# Patient Record
Sex: Male | Born: 1940
Health system: Southern US, Community
[De-identification: ages and names within clinical notes are randomized; demographics above are authoritative.]

## PROBLEM LIST (undated history)

## (undated) DIAGNOSIS — I2510 Atherosclerotic heart disease of native coronary artery without angina pectoris: Secondary | ICD-10-CM

## (undated) DIAGNOSIS — J45909 Unspecified asthma, uncomplicated: Secondary | ICD-10-CM

## (undated) DIAGNOSIS — H269 Unspecified cataract: Secondary | ICD-10-CM

## (undated) DIAGNOSIS — T7840XA Allergy, unspecified, initial encounter: Secondary | ICD-10-CM

## (undated) DIAGNOSIS — I10 Essential (primary) hypertension: Secondary | ICD-10-CM

## (undated) DIAGNOSIS — I6529 Occlusion and stenosis of unspecified carotid artery: Secondary | ICD-10-CM

## (undated) DIAGNOSIS — Z87442 Personal history of urinary calculi: Secondary | ICD-10-CM

## (undated) DIAGNOSIS — I4719 Other supraventricular tachycardia: Secondary | ICD-10-CM

## (undated) DIAGNOSIS — I471 Supraventricular tachycardia: Secondary | ICD-10-CM

## (undated) HISTORY — DX: Atherosclerotic heart disease of native coronary artery without angina pectoris: I25.10

## (undated) HISTORY — DX: Other supraventricular tachycardia: I47.19

## (undated) HISTORY — PX: EYE SURGERY: SHX253

## (undated) HISTORY — DX: Occlusion and stenosis of unspecified carotid artery: I65.29

## (undated) HISTORY — PX: CERVICAL DISC SURGERY: SHX588

## (undated) HISTORY — DX: Supraventricular tachycardia: I47.1

## (undated) HISTORY — DX: Essential (primary) hypertension: I10

## (undated) HISTORY — DX: Unspecified asthma, uncomplicated: J45.909

## (undated) HISTORY — PX: BACK SURGERY: SHX140

## (undated) HISTORY — DX: Unspecified cataract: H26.9

## (undated) HISTORY — DX: Allergy, unspecified, initial encounter: T78.40XA

---

## 2001-01-22 ENCOUNTER — Emergency Department (HOSPITAL_COMMUNITY): Admission: EM | Admit: 2001-01-22 | Discharge: 2001-01-22 | Payer: Self-pay | Admitting: Emergency Medicine

## 2001-01-22 ENCOUNTER — Encounter: Payer: Self-pay | Admitting: Emergency Medicine

## 2002-09-12 ENCOUNTER — Emergency Department (HOSPITAL_COMMUNITY): Admission: EM | Admit: 2002-09-12 | Discharge: 2002-09-12 | Payer: Self-pay | Admitting: Emergency Medicine

## 2002-09-12 ENCOUNTER — Encounter: Payer: Self-pay | Admitting: Emergency Medicine

## 2007-03-28 ENCOUNTER — Emergency Department (HOSPITAL_COMMUNITY): Admission: EM | Admit: 2007-03-28 | Discharge: 2007-03-28 | Payer: Self-pay | Admitting: Emergency Medicine

## 2007-03-29 ENCOUNTER — Emergency Department (HOSPITAL_COMMUNITY): Admission: EM | Admit: 2007-03-29 | Discharge: 2007-03-29 | Payer: Self-pay | Admitting: Emergency Medicine

## 2007-04-06 ENCOUNTER — Ambulatory Visit (HOSPITAL_COMMUNITY): Admission: RE | Admit: 2007-04-06 | Discharge: 2007-04-06 | Payer: Self-pay | Admitting: Urology

## 2007-09-12 ENCOUNTER — Emergency Department (HOSPITAL_COMMUNITY): Admission: EM | Admit: 2007-09-12 | Discharge: 2007-09-13 | Payer: Self-pay | Admitting: Emergency Medicine

## 2009-01-15 ENCOUNTER — Encounter: Payer: Self-pay | Admitting: Emergency Medicine

## 2009-01-15 ENCOUNTER — Inpatient Hospital Stay (HOSPITAL_COMMUNITY): Admission: AD | Admit: 2009-01-15 | Discharge: 2009-01-16 | Payer: Self-pay | Admitting: Cardiology

## 2009-01-15 ENCOUNTER — Ambulatory Visit: Payer: Self-pay | Admitting: Cardiology

## 2009-01-16 ENCOUNTER — Encounter: Payer: Self-pay | Admitting: Cardiovascular Disease

## 2009-01-16 ENCOUNTER — Encounter: Payer: Self-pay | Admitting: Cardiology

## 2009-02-08 DIAGNOSIS — J40 Bronchitis, not specified as acute or chronic: Secondary | ICD-10-CM | POA: Insufficient documentation

## 2009-02-08 DIAGNOSIS — I10 Essential (primary) hypertension: Secondary | ICD-10-CM | POA: Insufficient documentation

## 2009-02-08 DIAGNOSIS — R0602 Shortness of breath: Secondary | ICD-10-CM | POA: Insufficient documentation

## 2009-02-08 DIAGNOSIS — J45909 Unspecified asthma, uncomplicated: Secondary | ICD-10-CM | POA: Insufficient documentation

## 2009-02-08 DIAGNOSIS — I2510 Atherosclerotic heart disease of native coronary artery without angina pectoris: Secondary | ICD-10-CM | POA: Insufficient documentation

## 2009-02-09 ENCOUNTER — Encounter: Payer: Self-pay | Admitting: Cardiology

## 2009-02-09 ENCOUNTER — Ambulatory Visit: Payer: Self-pay | Admitting: Cardiology

## 2009-03-16 ENCOUNTER — Emergency Department (HOSPITAL_COMMUNITY): Admission: EM | Admit: 2009-03-16 | Discharge: 2009-03-16 | Payer: Self-pay | Admitting: Emergency Medicine

## 2009-03-24 ENCOUNTER — Inpatient Hospital Stay (HOSPITAL_COMMUNITY): Admission: EM | Admit: 2009-03-24 | Discharge: 2009-03-25 | Payer: Self-pay | Admitting: Emergency Medicine

## 2009-03-27 ENCOUNTER — Ambulatory Visit (HOSPITAL_COMMUNITY): Admission: RE | Admit: 2009-03-27 | Discharge: 2009-03-27 | Payer: Self-pay | Admitting: Urology

## 2010-12-17 ENCOUNTER — Inpatient Hospital Stay (HOSPITAL_COMMUNITY): Admission: EM | Admit: 2010-12-17 | Discharge: 2010-12-19 | Payer: Self-pay | Source: Home / Self Care

## 2010-12-20 LAB — URINALYSIS, ROUTINE W REFLEX MICROSCOPIC
Bilirubin Urine: NEGATIVE
Hgb urine dipstick: NEGATIVE
Nitrite: NEGATIVE
Protein, ur: NEGATIVE mg/dL
Specific Gravity, Urine: 1.02 (ref 1.005–1.030)
Urine Glucose, Fasting: NEGATIVE mg/dL
Urobilinogen, UA: 0.2 mg/dL (ref 0.0–1.0)
pH: 6 (ref 5.0–8.0)

## 2010-12-20 LAB — CARDIAC PANEL(CRET KIN+CKTOT+MB+TROPI)
CK, MB: 3.8 ng/mL (ref 0.3–4.0)
CK, MB: 4.2 ng/mL — ABNORMAL HIGH (ref 0.3–4.0)
CK, MB: 5 ng/mL — ABNORMAL HIGH (ref 0.3–4.0)
Relative Index: 2.9 — ABNORMAL HIGH (ref 0.0–2.5)
Relative Index: 1.8 (ref 0.0–2.5)
Relative Index: 1.5 (ref 0.0–2.5)
Total CK: 130 U/L (ref 7–232)
Total CK: 229 U/L (ref 7–232)
Total CK: 335 U/L — ABNORMAL HIGH (ref 7–232)
Troponin I: 0.02 ng/mL (ref 0.00–0.06)
Troponin I: 0.02 ng/mL (ref 0.00–0.06)
Troponin I: 0.02 ng/mL (ref 0.00–0.06)

## 2010-12-20 LAB — T4, FREE: Free T4: 1.17 ng/dL (ref 0.80–1.80)

## 2010-12-20 LAB — COMPREHENSIVE METABOLIC PANEL
ALT: 19 U/L (ref 0–53)
AST: 23 U/L (ref 0–37)
Albumin: 3.9 g/dL (ref 3.5–5.2)
Alkaline Phosphatase: 40 U/L (ref 39–117)
BUN: 12 mg/dL (ref 6–23)
CO2: 23 mEq/L (ref 19–32)
Calcium: 8.6 mg/dL (ref 8.4–10.5)
Chloride: 103 mEq/L (ref 96–112)
Creatinine, Ser: 0.86 mg/dL (ref 0.4–1.5)
GFR calc Af Amer: >60 mL/min (ref >60)
GFR calc non Af Amer: >60 mL/min (ref >60)
Glucose, Bld: 124 mg/dL — ABNORMAL HIGH (ref 70–99)
Potassium: 3.9 mEq/L (ref 3.5–5.1)
Sodium: 135 mEq/L (ref 135–145)
Total Bilirubin: 0.5 mg/dL (ref 0.3–1.2)
Total Protein: 6.9 g/dL (ref 6.0–8.3)

## 2010-12-20 LAB — CBC
HCT: 33.8 % — ABNORMAL LOW (ref 39.0–52.0)
HCT: 31 % — ABNORMAL LOW (ref 39.0–52.0)
Hemoglobin: 10.6 g/dL — ABNORMAL LOW (ref 13.0–17.0)
Hemoglobin: 9.9 g/dL — ABNORMAL LOW (ref 13.0–17.0)
MCH: 23.5 pg — ABNORMAL LOW (ref 26.0–34.0)
MCH: 23.9 pg — ABNORMAL LOW (ref 26.0–34.0)
MCHC: 31.4 g/dL (ref 30.0–36.0)
MCHC: 31.9 g/dL (ref 30.0–36.0)
MCV: 74.8 fL — ABNORMAL LOW (ref 78.0–100.0)
MCV: 74.7 fL — ABNORMAL LOW (ref 78.0–100.0)
Platelets: 291 10*3/uL (ref 150–400)
Platelets: 298 10*3/uL (ref 150–400)
RBC: 4.52 MIL/uL (ref 4.22–5.81)
RBC: 4.15 MIL/uL — ABNORMAL LOW (ref 4.22–5.81)
RDW: 15.7 % — ABNORMAL HIGH (ref 11.5–15.5)
RDW: 16 % — ABNORMAL HIGH (ref 11.5–15.5)
WBC: 10.4 10*3/uL (ref 4.0–10.5)
WBC: 7.7 10*3/uL (ref 4.0–10.5)

## 2010-12-20 LAB — URINE CULTURE
Colony Count: NO GROWTH
Culture  Setup Time: 201201142022
Culture: NO GROWTH
Special Requests: NEGATIVE

## 2010-12-20 LAB — LIPID PANEL
Cholesterol: 182 mg/dL (ref 0–200)
HDL: 41 mg/dL (ref >39)
LDL Cholesterol: 102 mg/dL — ABNORMAL HIGH (ref 0–99)
Total CHOL/HDL Ratio: 4.4 RATIO
Triglycerides: 194 mg/dL — ABNORMAL HIGH (ref <150)
VLDL: 39 mg/dL (ref 0–40)

## 2010-12-20 LAB — PHOSPHORUS: Phosphorus: 2.9 mg/dL (ref 2.3–4.6)

## 2010-12-20 LAB — BASIC METABOLIC PANEL
BUN: 10 mg/dL (ref 6–23)
CO2: 23 mEq/L (ref 19–32)
Calcium: 9.1 mg/dL (ref 8.4–10.5)
Chloride: 106 mEq/L (ref 96–112)
Creatinine, Ser: 0.81 mg/dL (ref 0.4–1.5)
GFR calc Af Amer: >60 mL/min (ref >60)
GFR calc non Af Amer: >60 mL/min (ref >60)
Glucose, Bld: 111 mg/dL — ABNORMAL HIGH (ref 70–99)
Potassium: 4 mEq/L (ref 3.5–5.1)
Sodium: 139 mEq/L (ref 135–145)

## 2010-12-20 LAB — POCT CARDIAC MARKERS
CKMB, poc: <1 ng/mL — ABNORMAL LOW (ref 1.0–8.0)
CKMB, poc: 1.4 ng/mL (ref 1.0–8.0)
Myoglobin, poc: 75.4 ng/mL (ref 12–200)
Myoglobin, poc: 120 ng/mL (ref 12–200)
Troponin i, poc: <0.05 ng/mL (ref 0.00–0.09)
Troponin i, poc: <0.05 ng/mL (ref 0.00–0.09)

## 2010-12-20 LAB — DIFFERENTIAL
Basophils Absolute: 0 10*3/uL (ref 0.0–0.1)
Basophils Relative: 1 % (ref 0–1)
Eosinophils Absolute: 0.1 10*3/uL (ref 0.0–0.7)
Eosinophils Relative: 1 % (ref 0–5)
Lymphocytes Relative: 29 % (ref 12–46)
Lymphs Abs: 2.2 10*3/uL (ref 0.7–4.0)
Monocytes Absolute: 0.7 10*3/uL (ref 0.1–1.0)
Monocytes Relative: 9 % (ref 3–12)
Neutro Abs: 4.7 10*3/uL (ref 1.7–7.7)
Neutrophils Relative %: 61 % (ref 43–77)

## 2010-12-20 LAB — IRON AND TIBC
Iron: 36 ug/dL — ABNORMAL LOW (ref 42–135)
Saturation Ratios: 8 % — ABNORMAL LOW (ref 20–55)
TIBC: 441 ug/dL — ABNORMAL HIGH (ref 215–435)
UIBC: 405 ug/dL

## 2010-12-20 LAB — VITAMIN B12: Vitamin B-12: 155 pg/mL — ABNORMAL LOW (ref 211–911)

## 2010-12-20 LAB — TSH
TSH: 0.872 u[IU]/mL (ref 0.350–4.500)
TSH: 1.319 u[IU]/mL (ref 0.350–4.500)

## 2010-12-20 LAB — FERRITIN: Ferritin: 7 ng/mL — ABNORMAL LOW (ref 22–322)

## 2010-12-20 LAB — LACTATE DEHYDROGENASE: LDH: 119 U/L (ref 94–250)

## 2010-12-20 LAB — D-DIMER, QUANTITATIVE: D-Dimer, Quant: 0.22 ug/mL-FEU (ref 0.00–0.48)

## 2010-12-20 LAB — MAGNESIUM: Magnesium: 2.2 mg/dL (ref 1.5–2.5)

## 2010-12-20 LAB — HEMOCCULT GUIAC POC 1CARD (OFFICE): Fecal Occult Bld: NEGATIVE

## 2010-12-20 LAB — FOLATE: Folate: 10.2 ng/mL

## 2010-12-22 LAB — ANTI-PARIETAL ANTIBODY: Parietal Cell Antibody-IgG: NEGATIVE

## 2010-12-22 LAB — OVA AND PARASITE EXAMINATION

## 2010-12-30 NOTE — H&P (Addendum)
Casey Klein, Casey Klein              ACCOUNT NO.:  [id number]  MEDICAL RECORD NO.:  [id number]          PATIENT TYPE:  EMS  LOCATION:  ED                            FACILITY:  [facility]  PHYSICIAN:  Rock Nephew, MD       DATE OF BIRTH:  28-Jun-1941  DATE OF ADMISSION:  12/17/2010 DATE OF DISCHARGE:  LH                             HISTORY & PHYSICAL   PRIMARY CARE PHYSICIAN:  Paulita Cradle, MD, at [facility]  CHIEF COMPLAINT:  Presyncope and 3 falls.  HISTORY OF PRESENT ILLNESS:  This is a 70 year old male with a past medical history of asthma, hypertension, history of bronchitis, history of nonobstructive coronary artery disease, history of hypercholesterolemia, history of kidney stone, history of cyst behind the left ear.  The patient reports that he was in his usual state of health until today.  The patient reports that he has had 3 falls today. The patient reports that he did not pass out, but he felt lightheaded. The patient described some nonspecific chest pain with no radiation.  He reported also some nausea and shortness of breath.  He denied having a pleuritic component of the chest pain.  He denied any radiation of the chest pain.  The patient denies having any seizure-like activity or any stroke-like activity.  The patient came into the ED.  The patient had essentially normal labs except for some mild microcytic anemia. Troponin-I point-of-care marker was negative.  The patient had no specific changes on the EKG.  We were asked to admit this patient.  PAST MEDICAL HISTORY: 1. History of asthma. 2. Hypertension. 3. Bronchitis. 4. History of nonobstructive coronary artery disease. 5. Mild history of hypercholesterolemia. 6. History of a kidney stone. 7. History of cyst behind the left ear.  PAST SURGICAL HISTORY:  Cervical disk surgery.  SOCIAL HISTORY:  The patient is nonsmoker, nondrinker, and no drug abuse.  The patient lives home alone.   The patient does not walk with a walker.  His daughter is present, who is also his medical decision maker if he cannot make his own medical decisions.  The patient's daughter's name is Mliss Sax, phone number [phone].  ALLERGIES:  PENICILLIN.  HOME MEDICATIONS:  He takes: 1. Amlodipine 5 mg p.o. daily. 2. Acetaminophen 500 mg p.r.n., unknown how often. 3. Aspirin 81 mg p.o. daily. 4. Albuterol p.r.n.  REVIEW OF SYSTEMS:  He denies any headache, blurry vision.  He reports some chronic back pain, he denies any currently.  He is not having any chest pain or any shortness of breath.  He currently is not nauseous, however, he was nauseous earlier.  He has no vomiting, he has no abdominal pain, he has no constipation, and no diarrhea.  He has no pain in his legs, but he does say he has a burning sensation in his legs.  PHYSICAL EXAMINATION:  VITAL SIGNS:  As follows:  Temperature 98.4, blood pressure 142/78, pulse rate 105 to 96, respiratory rate 20, 94% saturation 2 L nasal cannula. HEAD, EYES, EARS, NOSE, THROAT:  Normocephalic, atraumatic.  Pupils equally round and reactive to light. CARDIOVASCULAR:  The patient has S1 S2, regular rate, rhythm. LUNGS:  Clear to auscultation bilaterally, no wheezes, no rhonchi. ABDOMEN:  Soft, nontender, and nondistended, bowel sounds positive, no guarding, no rebound tenderness. EXTREMITIES:  No lower extremity edema is evident. NEUROLOGIC:  The patient is alert, awake, oriented x3.  His cranial nerves II-XII are grossly intact.  His cerebellar exam is intact and I do not appreciate any nystagmus.  IMPRESSION AND PLAN:  S 70 year old male admitted for presyncope and multiple falls: 1. Falls and presyncope.  Etiology is not clear.  Concern could be     cardiac-related presyncope.  The patient will be admitted to a     telemetry bed.  The patient will have cardiac enzymes cycled.  The     patient will also have a 2-D echocardiogram.   Unfortunately, it     probably will not be done until Monday.  We will also get a     computerized tomography of the head noncontrast to make sure there     is no acute abnormality. 2. History of hypertension.  For the hypertension, the patient will be     continued on amlodipine. 3. Nonspecific chest pain with a history of coronary artery disease.     The patient will be continued on aspirin.  We will cycle the     patient's cardiac enzymes x3.  We will also get a 2-D     echocardiogram to evaluate for any new wall motion abnormalities. 4. History of asthma.  The patient will be placed on albuterol p.r.n. 5. Hyperlipidemia.  We will check a full lipid profile.  The patient     does not appear to be taking an anticholesterol medication at home. 6. History of nephrolithiasis.  We will monitor.  We will check a     urinalysis. 7. Microcytic anemia.  We will check an anemia panel. 8. For deep venous thrombosis prophylaxis, the patient will be placed     on Lovenox. 9. The patient wishes to be a Do Not Resuscitate, which we will honor. 10.Also, the patient had evidence of some sinus tachycardia on the 12-     lead electrocardiogram.  The patient had     some nonspecific chest pain and shortness of breath.  Patient's     chest x-ray shows clear lungs so will get a STAT D-dimer.  If the     STAT D-dimer is positive, we will get a computed tomography     angiogram of the chest to rule out a pulmonary embolism.     Rock Nephew, MD     NH/MEDQ  D:  12/17/2010  T:  12/17/2010  Job:  [id number]  cc:   Paulita Cradle, M.D. [facility]  Electronically Signed by Rock Nephew MD on 12/30/2010 10:21:45 AM

## 2011-01-06 ENCOUNTER — Encounter: Payer: Self-pay | Admitting: Cardiology

## 2011-01-07 ENCOUNTER — Ambulatory Visit (INDEPENDENT_AMBULATORY_CARE_PROVIDER_SITE_OTHER): Payer: Medicare Other | Admitting: Cardiology

## 2011-01-07 ENCOUNTER — Ambulatory Visit: Admit: 2011-01-07 | Payer: Self-pay | Admitting: Cardiology

## 2011-01-07 ENCOUNTER — Encounter: Payer: Self-pay | Admitting: Cardiology

## 2011-01-07 DIAGNOSIS — R072 Precordial pain: Secondary | ICD-10-CM

## 2011-01-07 DIAGNOSIS — I6529 Occlusion and stenosis of unspecified carotid artery: Secondary | ICD-10-CM | POA: Insufficient documentation

## 2011-01-07 DIAGNOSIS — R42 Dizziness and giddiness: Secondary | ICD-10-CM

## 2011-01-11 ENCOUNTER — Ambulatory Visit (INDEPENDENT_AMBULATORY_CARE_PROVIDER_SITE_OTHER): Payer: Self-pay | Admitting: Internal Medicine

## 2011-01-12 NOTE — Assessment & Plan Note (Signed)
Summary: ROV PER PT CALL/LG/AMD   Vital Signs:  Patient profile:   70 year old male Height:      69 inches Weight:      194 pounds BMI:     28.75 Pulse rate:   80 / minute BP sitting:   164 / 78  (right arm) Cuff size:   regular  Vitals Entered By: Hardin Negus, RMA (January 07, 2011 2:59 PM)  Visit Type:  Follow-up Primary Provider:  Paulita Cradle NP  CC:  chest pain.  History of Present Illness: The patient presents after recent hospitalization. He was admitted with dizziness and presyncope. Carotid Dopplers demonstrated bilateral less than 50% stenosis. Head CT was negative. Telemetry demonstrated some atrial arrhythmias. There was no atrial fibrillation mentioned however in these arrhythmias were not correlated with symptoms. Since going home he has had no further presyncope. In the morning he gets a little dizzy but after he eats something this goes away. He denies any orthostatic symptoms. He denies any shortness of breath, PND or orthopnea. Interestingly he is describing decreased exercise tolerance. He used to be able to walk more for exercise but this has been more difficult. A shorter distance then previously getting short of breath. He developed some right-sided chest heaviness with this. This goes away but he stops what he is doing. He was actually told to stop exercising because of this. Is not noticing any palpitations. He has not had any neck or arm discomfort. He has had no PND or orthopnea.  Current Medications (verified): 1)  Metoprolol Tartrate 50 Mg Tabs (Metoprolol Tartrate) .... Take One Tablet By Mouth Twice A Day 2)  Ventolin Hfa 108 (90 Base) Mcg/act Aers (Albuterol Sulfate) .... As Directed 3)  Acetaminophen 500 Mg  Caps (Acetaminophen) .... As Needed 4)  Oxycodone-Acetaminophen 5-325 Mg Tabs (Oxycodone-Acetaminophen) .... At Bedtime 5)  Nu-Iron 150 Mg Caps (Polysaccharide Iron Complex) .... Two Times A Day 6)  Cyanocobalamin 1000 Mcg/ml Soln  (Cyanocobalamin) .... Once Daily 7)  Colace 100 Mg Caps (Docusate Sodium) .... 2 Once Daily  Allergies (verified): 1)  ! Pcn  Past History:  Past Medical History:  1. Kidney stone.   2. Asthma.   3. Hypertension.   4. Bronchitis.   5. Coronary artery disease (nonobstructive, mild).   6. Carotid stenosis (less than 50% bilateral 2012)  7. PAT  Past Surgical History: Reviewed history from 02/08/2009 and no changes required. Cervical disk surgery.   Review of Systems       As stated in the HPI and negative for all other systems.   Physical Exam  General:  Well developed, well nourished, in no acute distress. Head:  normocephalic and atraumatic Mouth:  edentulous, otherwise unremarkable. Chest Wall:  no deformities or breast masses noted Lungs:  Clear bilaterally to auscultation and percussion. Abdomen:  Bowel sounds positive; abdomen soft and non-tender without masses, organomegaly, or hernias noted. No hepatosplenomegaly. Msk:  Back normal, normal gait. Muscle strength and tone normal. Extremities:  No clubbing or cyanosis. Neurologic:  Alert and oriented x 3. Skin:  Intact without lesions or rashes. Cervical Nodes:  no significant adenopathy Inguinal Nodes:  no significant adenopathy Psych:  Normal affect.   Detailed Cardiovascular Exam  Neck    Carotids: Carotids full and equal bilaterally without bruits.      Neck Veins: Normal, no JVD.    Heart    Inspection: no deformities or lifts noted.      Palpation: normal PMI with no  thrills palpable.      Auscultation: regular rate and rhythm, S1, S2 without murmurs, rubs, gallops, or clicks.    Vascular    Abdominal Aorta: no palpable masses, pulsations, or audible bruits.      Femoral Pulses: normal femoral pulses bilaterally.      Pedal Pulses: normal pedal pulses bilaterally.      Radial Pulses: normal radial pulses bilaterally.      Peripheral Circulation: no clubbing, cyanosis, or edema noted with normal  capillary refill.     Impression & Recommendations:  Problem # 1:  DYSPNEA (ICD-786.05) The patient describes some dyspnea and chest discomfort. At this point I would like to screen him with an exercise treadmill test given this. Further cardiovascular testing will be based on the results. Orders: Treadmill (Treadmill)  Problem # 2:  CAROTID STENOSIS (ICD-433.10) He did have some carotid stenosis. I will schedule him for one year followup carotid Doppler. Orders: Carotid Duplex (Carotid Duplex)  Problem # 3:  ESSENTIAL HYPERTENSION, BENIGN (ICD-401.1) His blood pressure is slightly elevated. However, I am not sure that this is not an isolated finding and I will be able to judge better the need for further treatment at his upcoming treadmill.  Problem # 4:  DIZZINESS (ICD-780.4) He has had no further dizziness. He has had no palpitations. No further workup is planned at this point except as above.  Other Orders: EKG w/ Interpretation (93000)  Patient Instructions: 1)  Your physician recommends that you schedule a follow-up appointment in: 6 months with Dr Antoine Poche 2)  Your physician recommends that you continue on your current medications as directed. Please refer to the Current Medication list given to you today. 3)  Your physician has requested that you have a carotid duplex in one year (2/13). This test is an ultrasound of the carotid arteries in your neck. It looks at blood flow through these arteries that supply the brain with blood. Allow one hour for this exam. There are no restrictions or special instructions. 4)  Your physician has requested that you have an exercise tolerance test.  For further information please visit [email or web address]  Please also follow instruction sheet, as given.   Orders Added: 1)  EKG w/ Interpretation [93000] 2)  Treadmill [Treadmill] 3)  Carotid Duplex [Carotid Duplex]  Appended Document: ROV PER PT CALL/LG/AMD EKG sinus rhythm, rate 80, axis  within normal limits, intervals within normal limits, no acute ST-T wave changes

## 2011-01-12 NOTE — Miscellaneous (Signed)
  Clinical Lists Changes  Observations: Added new observation of US CAROTID:  Atherosclerotic plaque at both carotid bifurcations, right greater   than left.  Bilateral estimated ICA stenoses are less than 50%.  (12/18/2010 10:10)      Carotid Doppler  Procedure date:  12/18/2010  Findings:       Atherosclerotic plaque at both carotid bifurcations, right greater   than left.  Bilateral estimated ICA stenoses are less than 50%.

## 2011-01-19 ENCOUNTER — Emergency Department (HOSPITAL_COMMUNITY): Payer: Medicare Other

## 2011-01-19 ENCOUNTER — Emergency Department (HOSPITAL_COMMUNITY)
Admission: EM | Admit: 2011-01-19 | Discharge: 2011-01-19 | Disposition: A | Discharge status: Home / Self Care | Payer: Medicare Other | Attending: Emergency Medicine | Admitting: Emergency Medicine

## 2011-01-19 DIAGNOSIS — M549 Dorsalgia, unspecified: Secondary | ICD-10-CM | POA: Insufficient documentation

## 2011-01-19 DIAGNOSIS — J40 Bronchitis, not specified as acute or chronic: Secondary | ICD-10-CM | POA: Insufficient documentation

## 2011-01-19 DIAGNOSIS — I4891 Unspecified atrial fibrillation: Secondary | ICD-10-CM | POA: Insufficient documentation

## 2011-01-19 DIAGNOSIS — E7800 Pure hypercholesterolemia, unspecified: Secondary | ICD-10-CM | POA: Insufficient documentation

## 2011-01-19 DIAGNOSIS — I10 Essential (primary) hypertension: Secondary | ICD-10-CM | POA: Insufficient documentation

## 2011-01-19 DIAGNOSIS — IMO0002 Reserved for concepts with insufficient information to code with codable children: Secondary | ICD-10-CM | POA: Insufficient documentation

## 2011-01-19 DIAGNOSIS — Z79899 Other long term (current) drug therapy: Secondary | ICD-10-CM | POA: Insufficient documentation

## 2011-02-04 ENCOUNTER — Other Ambulatory Visit: Payer: Self-pay | Admitting: Family Medicine

## 2011-02-04 DIAGNOSIS — M545 Low back pain, unspecified: Secondary | ICD-10-CM

## 2011-02-05 ENCOUNTER — Ambulatory Visit
Admission: RE | Admit: 2011-02-05 | Discharge: 2011-02-05 | Disposition: A | Discharge status: Home / Self Care | Payer: Medicare Other | Source: Ambulatory Visit | Attending: Family Medicine | Admitting: Family Medicine

## 2011-02-05 DIAGNOSIS — M545 Low back pain, unspecified: Secondary | ICD-10-CM

## 2011-02-07 ENCOUNTER — Encounter: Payer: Medicare Other | Admitting: Physician Assistant

## 2011-03-16 LAB — URINALYSIS, ROUTINE W REFLEX MICROSCOPIC
Bilirubin Urine: NEGATIVE
Glucose, UA: NEGATIVE mg/dL
Ketones, ur: NEGATIVE mg/dL
Leukocytes, UA: NEGATIVE
Nitrite: NEGATIVE
Protein, ur: NEGATIVE mg/dL
Specific Gravity, Urine: 1.02 (ref 1.005–1.030)
Urobilinogen, UA: 0.2 mg/dL (ref 0.0–1.0)
pH: 7 (ref 5.0–8.0)

## 2011-03-16 LAB — BASIC METABOLIC PANEL
BUN: 10 mg/dL (ref 6–23)
CO2: 23 mEq/L (ref 19–32)
CO2: 22 mEq/L (ref 19–32)
Calcium: 8.7 mg/dL (ref 8.4–10.5)
Chloride: 104 mEq/L (ref 96–112)
Chloride: 105 mEq/L (ref 96–112)
Creatinine, Ser: 1.2 mg/dL (ref 0.4–1.5)
Creatinine, Ser: 1.13 mg/dL (ref 0.4–1.5)
GFR calc Af Amer: >60 mL/min (ref >60)
GFR calc Af Amer: >60 mL/min (ref >60)
GFR calc non Af Amer: 57 mL/min — ABNORMAL LOW (ref >60)
GFR calc non Af Amer: >60 mL/min (ref >60)
Glucose, Bld: 117 mg/dL — ABNORMAL HIGH (ref 70–99)
Potassium: 3.9 mEq/L (ref 3.5–5.1)
Potassium: 3.9 mEq/L (ref 3.5–5.1)
Potassium: 4 mEq/L (ref 3.5–5.1)
Sodium: 138 mEq/L (ref 135–145)
Sodium: 139 mEq/L (ref 135–145)

## 2011-03-16 LAB — DIFFERENTIAL
Basophils Relative: 1 % (ref 0–1)
Eosinophils Absolute: 0 10*3/uL (ref 0.0–0.7)
Eosinophils Relative: 0 % (ref 0–5)
Eosinophils Relative: 0 % (ref 0–5)
Lymphocytes Relative: 13 % (ref 12–46)
Lymphs Abs: 1.5 10*3/uL (ref 0.7–4.0)
Lymphs Abs: 1.5 10*3/uL (ref 0.7–4.0)
Monocytes Absolute: 0.9 10*3/uL (ref 0.1–1.0)
Monocytes Absolute: 0.2 10*3/uL (ref 0.1–1.0)
Monocytes Relative: 6 % (ref 3–12)
Monocytes Relative: 2 % — ABNORMAL LOW (ref 3–12)
Neutro Abs: 11.5 10*3/uL — ABNORMAL HIGH (ref 1.7–7.7)
Neutrophils Relative %: 83 % — ABNORMAL HIGH (ref 43–77)
Neutrophils Relative %: 88 % — ABNORMAL HIGH (ref 43–77)

## 2011-03-16 LAB — PROTIME-INR: INR: 1.1 (ref 0.00–1.49)

## 2011-03-16 LAB — CBC
HCT: 37.5 % — ABNORMAL LOW (ref 39.0–52.0)
HCT: 43.8 % (ref 39.0–52.0)
Hemoglobin: 13.1 g/dL (ref 13.0–17.0)
MCHC: 34.3 g/dL (ref 30.0–36.0)
MCV: 94.1 fL (ref 78.0–100.0)
MCV: 94.1 fL (ref 78.0–100.0)
Platelets: 203 10*3/uL (ref 150–400)
RBC: 4.65 MIL/uL (ref 4.22–5.81)
RBC: 4.65 MIL/uL (ref 4.22–5.81)
WBC: 12 10*3/uL — ABNORMAL HIGH (ref 4.0–10.5)
WBC: 13.9 10*3/uL — ABNORMAL HIGH (ref 4.0–10.5)

## 2011-03-16 LAB — URINE CULTURE
Colony Count: NO GROWTH
Culture: NO GROWTH

## 2011-03-16 LAB — URINE MICROSCOPIC-ADD ON

## 2011-03-16 LAB — APTT: aPTT: 27 seconds (ref 24–37)

## 2011-03-22 LAB — COMPREHENSIVE METABOLIC PANEL
Albumin: 3.7 g/dL (ref 3.5–5.2)
Alkaline Phosphatase: 50 U/L (ref 39–117)
BUN: 8 mg/dL (ref 6–23)
Chloride: 107 mEq/L (ref 96–112)
Creatinine, Ser: 0.98 mg/dL (ref 0.4–1.5)
Glucose, Bld: 125 mg/dL — ABNORMAL HIGH (ref 70–99)
Potassium: 4.3 mEq/L (ref 3.5–5.1)
Total Bilirubin: 0.9 mg/dL (ref 0.3–1.2)
Total Protein: 6.7 g/dL (ref 6.0–8.3)

## 2011-03-22 LAB — CARDIAC PANEL(CRET KIN+CKTOT+MB+TROPI)
CK, MB: 3.7 ng/mL (ref 0.3–4.0)
CK, MB: 4.4 ng/mL — ABNORMAL HIGH (ref 0.3–4.0)
Relative Index: 4 — ABNORMAL HIGH (ref 0.0–2.5)
Total CK: 110 U/L (ref 7–232)
Total CK: 65 U/L (ref 7–232)
Troponin I: 0.01 ng/mL (ref 0.00–0.06)

## 2011-03-22 LAB — LIPID PANEL
LDL Cholesterol: 124 mg/dL — ABNORMAL HIGH (ref 0–99)
Total CHOL/HDL Ratio: 5 RATIO
VLDL: 33 mg/dL (ref 0–40)

## 2011-03-22 LAB — URINALYSIS, ROUTINE W REFLEX MICROSCOPIC
Ketones, ur: NEGATIVE mg/dL
Nitrite: NEGATIVE
Protein, ur: NEGATIVE mg/dL
Urobilinogen, UA: 0.2 mg/dL (ref 0.0–1.0)

## 2011-03-22 LAB — POCT CARDIAC MARKERS
CKMB, poc: 1.6 ng/mL (ref 1.0–8.0)
Troponin i, poc: <0.05 ng/mL (ref 0.00–0.09)

## 2011-03-22 LAB — BASIC METABOLIC PANEL
BUN: 8 mg/dL (ref 6–23)
Calcium: 8.5 mg/dL (ref 8.4–10.5)
Chloride: 107 mEq/L (ref 96–112)
Creatinine, Ser: 0.82 mg/dL (ref 0.4–1.5)
GFR calc Af Amer: >60 mL/min (ref >60)
GFR calc non Af Amer: >60 mL/min (ref >60)

## 2011-03-22 LAB — CBC
MCHC: 34.7 g/dL (ref 30.0–36.0)
MCHC: 34.9 g/dL (ref 30.0–36.0)
MCV: 93.1 fL (ref 78.0–100.0)
Platelets: 226 10*3/uL (ref 150–400)
Platelets: 233 10*3/uL (ref 150–400)
RBC: 4.62 MIL/uL (ref 4.22–5.81)
RDW: 13.6 % (ref 11.5–15.5)
RDW: 13.3 % (ref 11.5–15.5)

## 2011-03-22 LAB — HEMOGLOBIN A1C
Hgb A1c MFr Bld: 5.9 % (ref 4.6–6.1)
Mean Plasma Glucose: 123 mg/dL

## 2011-03-22 LAB — HEPARIN LEVEL (UNFRACTIONATED)
Heparin Unfractionated: 0.29 IU/mL — ABNORMAL LOW (ref 0.30–0.70)
Heparin Unfractionated: <0.1 IU/mL — ABNORMAL LOW (ref 0.30–0.70)

## 2011-03-22 LAB — DIFFERENTIAL
Basophils Absolute: 0 10*3/uL (ref 0.0–0.1)
Basophils Relative: 0 % (ref 0–1)
Eosinophils Absolute: 0 10*3/uL (ref 0.0–0.7)
Neutro Abs: 8.7 10*3/uL — ABNORMAL HIGH (ref 1.7–7.7)
Neutrophils Relative %: 84 % — ABNORMAL HIGH (ref 43–77)

## 2011-03-22 LAB — PROTIME-INR
INR: 1.1 (ref 0.00–1.49)
Prothrombin Time: 14.5 seconds (ref 11.6–15.2)

## 2011-03-22 LAB — LIPASE, BLOOD: Lipase: 27 U/L (ref 11–59)

## 2011-03-24 ENCOUNTER — Telehealth: Payer: Self-pay | Admitting: Cardiology

## 2011-03-24 NOTE — Telephone Encounter (Signed)
Dr office wants to know if pt has sur clearance for his surgery. Dr office has sent over sur clearance form to be fill out.

## 2011-03-24 NOTE — Telephone Encounter (Signed)
Will ask Dr Antoine Poche if pt has been cleared for surgery as I do not see anything documented of the request

## 2011-03-28 ENCOUNTER — Encounter (HOSPITAL_COMMUNITY)
Admission: RE | Admit: 2011-03-28 | Discharge: 2011-03-28 | Disposition: A | Discharge status: Home / Self Care | Payer: Medicare Other | Source: Ambulatory Visit | Attending: Neurosurgery | Admitting: Neurosurgery

## 2011-03-28 ENCOUNTER — Other Ambulatory Visit (HOSPITAL_COMMUNITY): Payer: Self-pay | Admitting: Neurosurgery

## 2011-03-28 ENCOUNTER — Telehealth: Payer: Self-pay | Admitting: Cardiology

## 2011-03-28 DIAGNOSIS — R0602 Shortness of breath: Secondary | ICD-10-CM

## 2011-03-28 DIAGNOSIS — Z01811 Encounter for preprocedural respiratory examination: Secondary | ICD-10-CM

## 2011-03-28 DIAGNOSIS — R079 Chest pain, unspecified: Secondary | ICD-10-CM

## 2011-03-28 LAB — CBC
HCT: 42.8 % (ref 39.0–52.0)
Hemoglobin: 14 g/dL (ref 13.0–17.0)
MCHC: 32.7 g/dL (ref 30.0–36.0)
RBC: 5.3 MIL/uL (ref 4.22–5.81)
WBC: 12.2 10*3/uL — ABNORMAL HIGH (ref 4.0–10.5)

## 2011-03-28 LAB — BASIC METABOLIC PANEL
CO2: 25 mEq/L (ref 19–32)
Calcium: 8.9 mg/dL (ref 8.4–10.5)
Chloride: 110 mEq/L (ref 96–112)
GFR calc Af Amer: >60 mL/min (ref >60)
Glucose, Bld: 74 mg/dL (ref 70–99)
Potassium: 3.9 mEq/L (ref 3.5–5.1)
Sodium: 139 mEq/L (ref 135–145)

## 2011-03-28 LAB — SURGICAL PCR SCREEN

## 2011-03-28 NOTE — Telephone Encounter (Signed)
Spoke with Darl Pikes with [facility] and patient is having surgery Wednesday 03/30/11 and needs clearance by tomorrow. I will forward this to [facility] to review with Dr.Hochrein tomorrow.

## 2011-03-28 NOTE — Telephone Encounter (Signed)
Pt having surgery this Wednesday-faxed req 4-17 not heard back yet

## 2011-03-29 ENCOUNTER — Telehealth (HOSPITAL_COMMUNITY): Payer: Self-pay | Admitting: Radiology

## 2011-03-29 NOTE — Telephone Encounter (Signed)
This will be addressed on the 4/23 note.

## 2011-03-29 NOTE — Telephone Encounter (Signed)
Discussed with the patient and with Dr. Lovell Sheehan.  He needs a stress test prior to surgery.  He says that he could not walk on a treadmill.  He will need pharmacologic stress imaging prior to surgery.

## 2011-03-30 ENCOUNTER — Inpatient Hospital Stay (HOSPITAL_COMMUNITY): Admission: RE | Admit: 2011-03-30 | Payer: Medicare Other | Source: Ambulatory Visit | Admitting: Neurosurgery

## 2011-03-30 ENCOUNTER — Ambulatory Visit (HOSPITAL_COMMUNITY): Payer: Medicare Other | Attending: Cardiology | Admitting: Radiology

## 2011-03-30 DIAGNOSIS — R0989 Other specified symptoms and signs involving the circulatory and respiratory systems: Secondary | ICD-10-CM

## 2011-03-30 DIAGNOSIS — Z01810 Encounter for preprocedural cardiovascular examination: Secondary | ICD-10-CM | POA: Insufficient documentation

## 2011-03-30 DIAGNOSIS — R079 Chest pain, unspecified: Secondary | ICD-10-CM

## 2011-03-30 DIAGNOSIS — R0602 Shortness of breath: Secondary | ICD-10-CM

## 2011-03-31 ENCOUNTER — Ambulatory Visit (HOSPITAL_BASED_OUTPATIENT_CLINIC_OR_DEPARTMENT_OTHER): Payer: Medicare Other | Admitting: Radiology

## 2011-03-31 VITALS — Ht 70.0 in | Wt 192.0 lb

## 2011-03-31 DIAGNOSIS — R0989 Other specified symptoms and signs involving the circulatory and respiratory systems: Secondary | ICD-10-CM

## 2011-03-31 DIAGNOSIS — Z01810 Encounter for preprocedural cardiovascular examination: Secondary | ICD-10-CM

## 2011-03-31 DIAGNOSIS — R0609 Other forms of dyspnea: Secondary | ICD-10-CM

## 2011-03-31 DIAGNOSIS — R0789 Other chest pain: Secondary | ICD-10-CM

## 2011-03-31 DIAGNOSIS — R0602 Shortness of breath: Secondary | ICD-10-CM

## 2011-03-31 LAB — MRSA CULTURE

## 2011-03-31 MED ORDER — TECHNETIUM TC 99M TETROFOSMIN IV KIT
33.0000 | PACK | Freq: Once | INTRAVENOUS | Status: AC | PRN
  Administered 2011-03-31: 33 via INTRAVENOUS

## 2011-03-31 MED ORDER — TECHNETIUM TC 99M TETROFOSMIN IV KIT
28.3000 | PACK | Freq: Once | INTRAVENOUS | Status: AC | PRN
  Administered 2011-03-30: 28 via INTRAVENOUS

## 2011-03-31 MED ORDER — REGADENOSON 0.4 MG/5ML IV SOLN
0.4000 mg | Freq: Once | INTRAVENOUS | Status: AC
  Administered 2011-03-31: 0.4 mg via INTRAVENOUS

## 2011-03-31 NOTE — Progress Notes (Addendum)
[facility] SITE 3 NUCLEAR MED [address] [place] [address] [phone]  Cardiology Nuclear Med Study  Casey Klein is a 70 y.o. male [id number] Apr 05, 1941   Nuclear Med Background Indication for Stress Test:  Evaluation for Ischemia and Surgical Clearance-Pre-op Back Surgery, Dr. Tressie Stalker History: 2/10  Echo-75% and 2/10  Heart Catheterization EF 70%, LAD-30% Cardiac Risk Factors: Carotid Disease, Family History - CAD, History of Smoking and Hypertension  Symptoms:  Chest Pain with Exertion (last date of chest discomfort 1 month ago), Dizziness, DOE, Rapid HR and SOB   Nuclear Pre-Procedure Caffeine/Decaff Intake:  None NPO After: 7:00pm   Lungs:  Clear IV 0.9% NS with Angio Cath:  20g  IV Site: R Antecubital  IV Started by:  Stanton Kidney, EMT-P  Chest Size (in):  42 Cup Size: n/a  Height: 5\' 10"  (1.778 m)  Weight:  192 lb (87.091 kg)  BMI:  Body mass index is 27.55 kg/(m^2). Tech Comments:  Patient did not take any meds.    Nuclear Med Study 1 or 2 day study: 1 day  Stress Test Type:  Eugenie Birks  Reading MD: Kristeen Miss, MD  Order Authorizing Provider:  Dr. Rollene Rotunda  Resting Radionuclide: Technetium 12m Tetrofosmin  Resting Radionuclide Dose: 28.3 mCi   Stress Radionuclide:  Technetium 57m Tetrofosmin  Stress Radionuclide Dose: 33 mCi           Stress Protocol Rest HR: 90 Stress HR: 114  Rest BP: 130/90 Stress BP: 156/99  Exercise Time (min): n/a METS: n/a   Predicted Max HR: 149 bpm % Max HR: 70.47 bpm Rate Pressure Product: 21308   Dose of Adenosine (mg):  n/a Dose of Lexiscan: 0.4 mg  Dose of Atropine (mg): n/a Dose of Dobutamine: n/a mcg/kg/min (at max HR)  Stress Test Technologist: Bonnita Levan, RN  Nuclear Technologist:  Doyne Keel, CNMT     Rest Procedure:  Myocardial perfusion imaging was performed at rest 45 minutes following the intravenous administration of Technetium 71m Tetrofosmin. Rest ECG: NSR with  PAC's  Stress Procedure:  The patient received IV Lexiscan 0.4 mg over 15-seconds.  Technetium 12m Tetrofosmin injected at 30-seconds.  There were no significant changes with Lexiscan.  Quantitative spect images were obtained after a 45 minute delay. Stress ECG: No significant change from baseline ECG  QPS Raw Data Images:  Normal; no motion artifact; normal heart/lung ratio. Stress Images:  There is mild apical thinning with normal uptake in the other regions.   Rest Images:  There is mild apical thinning with normal uptake in the other regions. Subtraction (SDS):  No evidence of ischemia. Transient Ischemic Dilatation (Normal <1.22): .96  Lung/Heart Ratio (Normal <0.45):  .33   Quantitative Gated Spect Images QGS EDV: n/a ml QGS ESV: n/aml QGS cine images:  We were no able to gate study because of the frequent PACs. QGS EF: Study not gated  Impression Exercise Capacity:  Lexiscan with no exercise. BP Response:  Normal blood pressure response. Clinical Symptoms:  No chest pain. ECG Impression:  No significant ST segment change suggestive of ischemia. Comparison with Prior Nuclear Study: No previous nuclear study performed  Overall Impression:  Low risk stress nuclear study.  No evidence of ischemia.  He has mild apical thinning at rest and exercise.  We were not able to gate the study so no LV function information can be obtained.  Suggest echocardiogram to assess LV function.   Elyn Aquas., MD  No  evidence of ischemia.  LV function not assessed.  OK for the planned surgery.  I will consider echocardiogram at a follow up visit.  Fax results to Dr. Lovell Sheehan for preoperative clearance.  Rollene Rotunda

## 2011-03-31 NOTE — Progress Notes (Deleted)
[facility] SITE 3 NUCLEAR MED [address] [place] [address] [phone]  Cardiology Nuclear Med Study  Casey Klein is a 70 y.o. male [id number] 03/05/1941   Nuclear Med Background Indication for Stress Test:  Evaluation for Ischemia and Surgical Clearance History:  {CHL HISTORY STRESS TEST:21021012} Cardiac Risk Factors: Carotid Disease, Family History - CAD, History of Smoking and Hypertension  Symptoms:  {CHL SYMPTOMS STRESS TEST:21021013}   Nuclear Pre-Procedure Caffeine/Decaff Intake:  {CHL CAFFEINE/DECAFF INTAKE NUC:21021108} NPO After: 7:00pm   Lungs:  *** IV 0.9% NS with Angio Cath:  20g  IV Site: R Antecubital  IV Started by:  Stanton Kidney, EMT-P  Chest Size (in):  42 Cup Size: n/a  Height: 5\' 10"  (1.778 m)  Weight:  192 lb (87.091 kg)  BMI:  Body mass index is 27.55 kg/(m^2). Tech Comments:  Metoprolol held this am, per patient    Nuclear Med Study 1 or 2 day study: {CHL 1 OR 2 DAY STUDY:21021019}  Stress Test Type:  {CHL STRESS TEST TYPE:21021018}  Reading MD: {CHL LB NUC READING LK:44010272}  Order Authorizing Provider:  ***  Resting Radionuclide: {CHL RESTING RADIONUCLIDE:21021021}  Resting Radionuclide Dose: *** mCi   Stress Radionuclide:  {CHL STRESS RADIONUCLIDE:21021022}  Stress Radionuclide Dose: *** mCi           Stress Protocol Rest HR: *** Stress HR: ***  Rest BP: *** Stress BP: ***  Exercise Time (min): {NA AND WILDCARD:21589} METS: {NA AND ZDGUYQIH:47425}          Dose of Adenosine (mg):  {NA AND ZDGLOVFI:43329} Dose of Lexiscan: {CHL CARD WILDCARD AND 0.4:21590} mg  Dose of Atropine (mg): {NA AND JJOACZYS:06301} Dose of Dobutamine: {NA AND WILDCARD:21589} mcg/kg/min (at max HR)  Stress Test Technologist: {CHL LB STRESS TEST TECHNOLOGIST:21021024}  Nuclear Technologist:  {CHL LB NUCLEAR TECHNOLOGIST:21021025}     Rest Procedure:  {CHL REST PROCEDURE NUCLEAR:21021027} Rest ECG: {CHL REST SWF:09323}  Stress  Procedure:  {CHL STRESS PROCEDURE NUCLEAR:21021028} Stress ECG: {CHL CAR STRESS ECG:21561}  QPS Raw Data Images:  {CHL RAW DATA IMAGES NUC:21021029} Stress Images:  {CHL STRESS IMAGES NUC:21021030} Rest Images:  {CHL REST IMAGES NUC:21021031} Subtraction (SDS):  {CHL SUBTRACTION (SDS) NUC:21021032} Transient Ischemic Dilatation (Normal <1.22):  *** Lung/Heart Ratio (Normal <0.45):  ***  Quantitative Gated Spect Images QGS EDV:  *** ml QGS ESV:  *** ml QGS cine images:  {CHL CARD QGS:21591:o} QGS EF: {CHL CARD STUDY NOT GATED:21592:o}  Impression Exercise Capacity:  {CHL EXERCISE CAPACITY NUC:21021037} BP Response:  {CHL BP RESPONSE NUC:21021038} Clinical Symptoms:  {CHL CLINICAL SYMPTOMS NUC:21021039} ECG Impression:  {CHL ECG IMPRESSION NUC:21021040} Comparison with Prior Nuclear Study: {CHL NUCLEAR STUDY COMPARISON:21562}  Overall Impression:  {CHL OVERALL IMPRESSION NUC:21021041}    Signed by Leotis Pain on 03/31/2011 at 7:53 AM.

## 2011-04-04 NOTE — Progress Notes (Signed)
Pt aware of results and information faxed to Dr Lovell Sheehan office

## 2011-04-04 NOTE — Progress Notes (Signed)
NUCLEAR REPORT SENT TO DR.HOCHREIN. Casey Klein, Casey Klein

## 2011-04-15 ENCOUNTER — Encounter (HOSPITAL_COMMUNITY)
Admission: RE | Admit: 2011-04-15 | Discharge: 2011-04-15 | Disposition: A | Discharge status: Home / Self Care | Payer: Medicare Other | Source: Ambulatory Visit | Attending: Neurosurgery | Admitting: Neurosurgery

## 2011-04-15 LAB — BASIC METABOLIC PANEL
BUN: 9 mg/dL (ref 6–23)
Calcium: 9.1 mg/dL (ref 8.4–10.5)
GFR calc non Af Amer: >60 mL/min (ref >60)
Glucose, Bld: 179 mg/dL — ABNORMAL HIGH (ref 70–99)
Potassium: 3.8 mEq/L (ref 3.5–5.1)
Sodium: 142 mEq/L (ref 135–145)

## 2011-04-15 LAB — CBC
HCT: 40.4 % (ref 39.0–52.0)
MCHC: 33.4 g/dL (ref 30.0–36.0)
Platelets: 241 10*3/uL (ref 150–400)
RDW: 17.9 % — ABNORMAL HIGH (ref 11.5–15.5)
WBC: 8.4 10*3/uL (ref 4.0–10.5)

## 2011-04-15 LAB — SURGICAL PCR SCREEN: Staphylococcus aureus: NEGATIVE

## 2011-04-18 ENCOUNTER — Inpatient Hospital Stay (HOSPITAL_COMMUNITY)
Admission: RE | Admit: 2011-04-18 | Discharge: 2011-04-19 | DRG: 491 | Disposition: A | Discharge status: Home / Self Care | Payer: Medicare Other | Source: Ambulatory Visit | Attending: Neurosurgery | Admitting: Neurosurgery

## 2011-04-18 ENCOUNTER — Inpatient Hospital Stay (HOSPITAL_COMMUNITY): Payer: Medicare Other

## 2011-04-18 DIAGNOSIS — Z01812 Encounter for preprocedural laboratory examination: Secondary | ICD-10-CM

## 2011-04-18 DIAGNOSIS — Z79899 Other long term (current) drug therapy: Secondary | ICD-10-CM

## 2011-04-18 DIAGNOSIS — I10 Essential (primary) hypertension: Secondary | ICD-10-CM | POA: Diagnosis present

## 2011-04-18 DIAGNOSIS — J449 Chronic obstructive pulmonary disease, unspecified: Secondary | ICD-10-CM | POA: Diagnosis present

## 2011-04-18 DIAGNOSIS — K589 Irritable bowel syndrome without diarrhea: Secondary | ICD-10-CM | POA: Diagnosis present

## 2011-04-18 DIAGNOSIS — I2510 Atherosclerotic heart disease of native coronary artery without angina pectoris: Secondary | ICD-10-CM | POA: Diagnosis present

## 2011-04-18 DIAGNOSIS — J4489 Other specified chronic obstructive pulmonary disease: Secondary | ICD-10-CM | POA: Diagnosis present

## 2011-04-18 DIAGNOSIS — Z87891 Personal history of nicotine dependence: Secondary | ICD-10-CM

## 2011-04-18 DIAGNOSIS — M5126 Other intervertebral disc displacement, lumbar region: Principal | ICD-10-CM | POA: Diagnosis present

## 2011-04-19 NOTE — Discharge Summary (Signed)
Casey Klein, Casey Klein              ACCOUNT NO.:  [id number]   MEDICAL RECORD NO.:  [id number]          PATIENT TYPE:  INP   LOCATION:  A309                          FACILITY:  [facility]   PHYSICIAN:  Skeet Latch, DO    DATE OF BIRTH:  1941-01-18   DATE OF ADMISSION:  03/24/2009  DATE OF DISCHARGE:  04/21/2010LH                               DISCHARGE SUMMARY   DISCHARGE DIAGNOSES:  1. Left urolithiasis with hydronephrosis.  2. History of mild nonobstructive coronary artery disease.  3. History of hypertension.  4. History of dyslipidemia.   BRIEF HOSPITAL COURSE:  This is a 70 year old Caucasian male who  presented with a week and a half of left flank pain.  The patient states  the pain slowly worsened, had to come to the ER to be evaluated.  He  states the pain is 10/10 in intensity with radiation to his left groin.  He has associated nausea and vomiting.  There are no relieving factors  identified.  The patient works in the emergency room, was placed on pain  medication and discharged prior to presenting on March 24, 2009.  The  patient did not have any fever or chills.  The patient did admit having  pain with urination but not had any hematuria.  The patient presented  again back to the hospital with the above complaints.   INITIAL LABORATORY DATA:  White count of 13,900 with 82 neutrophils,  hemoglobin 14.9, platelet count 236,000.  Sodium of 139, glucose 117, no  renal function he had moderate amount of blood in his urine, 36 wbc's,  many rbc's.  He had a CT of his abdomen and pelvis that show persistent  left hydronephrosis, and proximal hydroureter secondary to 5 mm x 3 mm x  7 mm left ureteral calculus at the level of the iliac crest, increased  left perinephric edema was noticed.  The patient was admitted for left  urolithiasis, was placed on IV pain medication as well as antiemetics.  The patient was also placed on IV antibiotics.  Urology was consulted  and feel at this  time, the patient could have a cystoscopy or left  retrograde pyelogram as an outpatient in the next 2 days.  Risks and  benefits were explained to the patient by Urology, and at this time, we  will send the patient home.   DISCHARGE MEDICATION:  1. Flomax 0.4 mg daily.  2. Metoclopramide 10 mg 30 minutes before meals.  3. Oxycodone/acetaminophen 5/325 mg 1-2 tabs q.6 as needed.  4. Amlodipine 5 mg daily.  5. Metoprolol  25 mg twice a day.  6. Simvastatin 40 mg daily.  7. Percocet 7.5/325 mg 1 p.o. q.6 hours p.r.n.  A prescription was      given by Urology.  8. Ciprofloxacin 500 mg twice a day for 5 days.   His current vital signs, temperature is 96.7, pulse 75, respirations 18,  blood pressure 130/83, sating 96% on room air.   LABORATORY DATA:  Sodium 138, potassium 3.9, chloride 108, CO2 is 23,  glucose 128, BUN 10, creatinine 1.66.  White count  is 12,000, hemoglobin  13.1, hematocrit 37.5, platelet count is 203,000.   RADIOLOGIC STUDIES:  Abdominal x-ray showed a left ureteral calculus  measured on prior studies and evident on KUB.  Please see brief hospital  course for results of this abdomen and pelvis CAT scan.   CONDITION AT DISCHARGE:  Stable.   DISPOSITION:  The patient will be discharged to home.   DISCHARGE INSTRUCTIONS:  The patient to maintain a low salt, heart  healthy diet.  He is to increase his activity slowly.  The patient is to  follow up with his primary care physician in the next 5-7 days.  The  patient is to return for outpatient cystogram of left retrograde  pyelogram in the next 2 days, instructions have been given.  Take his  medications as directed.  The patient is to return to emergency room if  pain worsens prior to procedure in the next 2 days.      Skeet Latch, DO  Electronically Signed     SM/MEDQ  D:  03/25/2009  T:  03/26/2009  Job:  [id number]   cc:   Paulita Cradle, FNP  [place] [facility]

## 2011-04-19 NOTE — H&P (Signed)
Casey Klein, Casey Klein              ACCOUNT NO.:  [id number]   MEDICAL RECORD NO.:  [id number]          PATIENT TYPE:  INP   LOCATION:  A309                          FACILITY:  [facility]   PHYSICIAN:  Osvaldo Shipper, MD     DATE OF BIRTH:  [birth year]   DATE OF ADMISSION:  03/24/2009  DATE OF DISCHARGE:  LH                              HISTORY & PHYSICAL   PMD:  Paulita Cradle in [facility].   CARDIOLOGIST:  [facility].   ADMITTING DIAGNOSES:  1. Left ureterolithiasis with hydronephrosis.  2. History of mild nonobstructive coronary artery disease.  3. History of hypertension.  4. History of dyslipidemia.   CHIEF COMPLAINT:  Left flank pain for 10 days.   HISTORY OF PRESENT ILLNESS:  Patient is a 70 year old Caucasian male who  was in his usual state of health until about a week and a half ago when  he started experiencing pain in the left flank.  The pain slowly  worsened and he had to come into the emergency department.  The pain was  10/10 in intensity at that time, radiating to the groin, associated with  nausea, vomiting.  No precipitant aggravating or relieving factors  identified.  The patient was seen in the ED and was discharged home off  the pain medications.  Patient had actually improved but then he started  developing pain again this morning on the left side, denies any fever or  chills, pain was 4/10 in intensity with radiation to the groin.  He  admits to pain with urination but denies any hematuria.  He decided  subsequently to come into the hospital.  He was having nausea, vomiting  as well.  Denies any blood in the emesis.  He has been given pain  medication in the ED and now is feeling better.   MEDICATIONS AT HOME:  Unfortunately, he does not know what exactly he  takes.  However, there is a discharge summary from February 12th,  dictated by cardiology, and according to them he on the following:  1. Aspirin 81 mg daily.  2.  Simvastatin 40 mg daily.  3. Metoprolol 25 mg b.i.d.  4. Norvasc 5 mg daily.  5. Nitroglycerin sublingually as needed.  6. He is also on albuterol inhaler as needed.   ALLERGIES:  PENICILLIN.   PAST MEDICAL HISTORY:  Positive for:  1. Nephrolithiasis.  2. Asthma.  3. Hypertension.  4. Bronchitis.  5. Cervical disk surgery.  6. No history of diabetes.  7. No history of strokes.  8. He actually told me that he had a stent put in when they did a      cardiac cath, but according to the report he was found to have      nonobstructive disease requiring no intervention whatsoever.  His      EF was 70%.   SOCIAL HISTORY:  He lives alone in [place].  His children live close  by.  No smoking, alcohol or illicit drug use.  He is independent with  his daily activity.   FAMILY HISTORY:  Negative for any  health problems, per the patient.   REVIEW OF SYSTEMS:  GENERAL:  Positive for weakness, malaise.  HEENT:  Unremarkable.  CARDIOVASCULAR:  Unremarkable.  Negative for chest pain.  RESPIRATORY:  Unremarkable.  GI:  As in HPI.  GU:  As in HPI.  NEUROLOGICAL:  Unremarkable.  PSYCHIATRIC:  Unremarkable.  DERMATOLOGIC:  Unremarkable.  MUSCULOSKELETAL:  Unremarkable.  OTHER SYSTEMS:  Unremarkable.   PHYSICAL EXAMINATION:  VITAL SIGNS:  In the ED, his temperature was  98.3, heart rate 91, respiratory rate 18, blood pressure 154/96,  saturation 96% on room air.  GENERAL:  This is an elderly white male in no distress.  HEENT:  There is no pallor, no icterus.  Oral mucous membrane is moist,  no oral lesions are noted.  NECK:  Soft and supple.  No thyromegaly is appreciated.  LUNGS:  Clear to auscultation anteriorly bilaterally.  CARDIOVASCULAR:  S1-S2 is normal, regular.  No murmurs appreciated.  No  S3-S4.  No rubs, no bruits.  ABDOMEN:  Soft.  Tenderness in the suprapubic area is appreciated but no  rebound, rigidity or guarding is present, no masses or organomegaly is  appreciated.  No  tenderness in the CVA.  MUSCULOSKELETAL:  Unremarkable.  SKIN:  Did not show any rash.  No cervical lymphadenopathy was noted.  NEUROLOGICALLY:  He is alert, oriented x3.  No focal neurological  deficits are present.   LABORATORY DATA:  His white count is 13,900 with 82% neutrophils,  hemoglobin is 14.9, platelet count is 236.  His sodium is 139, glucose  117.  Renal function is normal.  UA shows moderate blood, 3-6 WBCs, many  RBCs.   CT of the abdomen and pelvis shows persistent left hydronephrosis and  proximal hydroureter secondary to a 5 mm x 3 mm x 7 mm left ureteral  calculus at the level of the iliac crest, increased left perinephric  edema was noted.   ASSESSMENT:  This is a 70 year old Caucasian male who presents with left  flank pain and is noted to have left ureterolithiasis with  hydronephrosis and some perinephric edema.  He is being admitted for  further evaluation.   PLAN:  1. Left ureterolithiasis.  Pain control will be provided.  His nausea,      vomiting will be treated symptomatically.  Urology has been      contacted by ED physician and we await their input.  We will put      him on ciprofloxacin.  2. History of nonobstructive coronary artery disease.  Continue with      aspirin, beta-blocker.  3. History of dyslipidemia.  Continue with simvastatin.  4. DVT prophylaxis will be provided.  5. History of hypertension.  Continue with Norvasc.  6. We will also obtain a medication list from [facility] to make sure we are      not missing out on any of his home medications and also to confirm      that he indeed is on the current list of medications.   Further management decisions will depend on results of further testing  and patient's response to treatment.      Osvaldo Shipper, MD  Electronically Signed     GK/MEDQ  D:  03/24/2009  T:  03/24/2009  Job:  [id number]   cc:   [facility]  [phone] [address], [place] [address]

## 2011-04-19 NOTE — Cardiovascular Report (Signed)
NAME:  Casey Klein, Casey Klein NO.:  [id number]   MEDICAL RECORD NO.:  [id number]          PATIENT TYPE:  EMS   LOCATION:  ED                            FACILITY:  [facility]   PHYSICIAN:  Verne Carrow, MDDATE OF BIRTH:  1941-02-07   DATE OF PROCEDURE:  01/16/2009  DATE OF DISCHARGE:                            CARDIAC CATHETERIZATION   PRIMARY CARDIOLOGIST:  Rollene Rotunda, MD, [facility]   PROCEDURE PERFORMED:  1. Left heart catheterization  2. Selective coronary angiography.  3. Left ventricular angiogram.   OPERATOR:  Verne Carrow, MD   INDICATIONS:  Chest pain in a 70 year old Caucasian male with a history  of hypertension.  The patient ruled out for myocardial infarction with  serial cardiac enzymes and had no ischemic EKG changes.  However, the  story was worrisome for obstructive coronary artery disease, so a left  heart catheterization was ordered.   PROCEDURE IN DETAIL:  The patient was brought to the Inpatient Cardiac  Catheterization Laboratory after signing informed consent for the  procedure.  The right groin was prepped and draped in a sterile fashion.  Lidocaine 1% was used for local anesthesia.  A 5-French sheath was  inserted into the right femoral artery without difficulty.  Standard  diagnostic catheters were used to perform selective coronary angiography  and left ventricular angiography.  No pressure gradient was measured  across the aortic valve.  The patient tolerated the procedure well and  taken to the holding area in stable condition.   ANGIOGRAPHIC FINDINGS:  1. The left main coronary artery was a short vessel and had no      disease.  2. Left anterior descending is a large vessel that courses to the apex      and gives off a large early diagonal branch and a smaller second      diagonal branch.  There is a 30% stenosis in the midportion of the      LAD and luminal irregularities in the distal portion.  There is no      obstructive  disease in the left anterior descending system.  3. The circumflex artery gives off a small early ramus intermedius      branch and then a larger second obtuse marginal branch followed by      a large posterolateral branch.  The circumflex is a large-caliber      vessel that has luminal irregularities only in the proximal and      midportions.  There is no obstructive disease in the circumflex      system.  4. The right coronary artery is a moderate-to-large size dominant      vessel that has luminal irregularities in the proximal portion.      There is no obstructive disease in the right coronary artery.  5. Left ventricular angiogram was performed in the RAO projection that      shows hyperdynamic left ventricular function with left ventricular      hypertrophy and an overall ejection fraction estimated at 70%.      There is no significant mitral regurgitation noted.   HEMODYNAMIC DATA:  Central aortic pressure 138/86, LV pressure 152/13,  and end-diastolic pressure 17.   IMPRESSION:  1. Mild nonobstructive coronary artery disease.  2. Hyperdynamic left ventricular systolic function.   RECOMMENDATIONS:  The patient will need continued medical management of  his nonobstructive coronary artery disease and aggressive risk factor  modification.  We will perform an echocardiogram to better assess his  left ventricular wall thickness.  The patient will be continued on  aspirin, statin, and beta-blocker.  He will be discharged to home later  this afternoon after his echocardiogram and mandatory 3 hours of  bedrest.  We will plan followup with Dr. Rollene Rotunda in 2-3 weeks.      Verne Carrow, MD  Electronically Signed     CM/MEDQ  D:  01/16/2009  T:  01/16/2009  Job:  [id number]   cc:   Rollene Rotunda, MD, [facility]

## 2011-04-19 NOTE — H&P (Signed)
Casey Klein, Casey Klein              ACCOUNT NO.:  [id number]   MEDICAL RECORD NO.:  [id number]          PATIENT TYPE:  INP   LOCATION:  2923                         FACILITY:  [facility]   PHYSICIAN:  Darryl D. Prime, MD    DATE OF BIRTH:  1941-09-11   DATE OF ADMISSION:  01/15/2009  DATE OF DISCHARGE:                              HISTORY & PHYSICAL   CHIEF COMPLAINT:  Chest pain and lightheadedness.   HISTORY OF PRESENT ILLNESS:  This is a 70 year old with past medical  history significant for hypertension and asthma who presented to the  emergency department complaining of generalized weakness,  lightheadedness, vertigo in the morning of admission.  The patient  relate also chest pain on and off since last December.  He relates chest  pain 5/10, achy in nature that started while he was shoveling snow, the  day prior to admission.  The patient related a chest pain is located in  the middle chest and epigastric area.  It is associated with diaphoresis  and dyspnea.  The patient developed an episode of chest pain while in  the emergency department, 9/10 in intensity, not radiating, associated  with diaphoresis and lightheadedness.  The patient also had 1 episode of  vomiting prior to arrival to hospital.   REVIEW OF SYSTEMS:  Negative except for pertinent per HPI.   ALLERGIES:  PENICILLIN.   PAST MEDICAL HISTORY:  1. Kidney stone.  2. Asthma.  3. Hypertension.  4. Bronchitis.   PAST SURGICAL HISTORY:  Cervical disk surgery.   MEDICATIONS:  Tylenol and albuterol inhaler.   SOCIAL HISTORY:  He used to smoke, he quit 20 years ago.  He smoked 1  pack per day for 20 years.  He denies alcohol or any recreational drugs.   FAMILY HISTORY:  Mother died at 67.  Father died at 51 of MI.   PHYSICAL EXAMINATION:  VITAL SIGNS:  Blood pressure 153/86, pulse 110,  respirations 24, temperature 98.5, and O2 sat 96% on room air.  GENERAL:  The patient is in no acute distress.  Denies any chest  pain at  this moment.  CARDIOVASCULAR:  S1, S2, S3.  No murmur.  NECK:  No JVD.  No carotid bruits.  LUNGS:  Bilateral good air movement.  Bilateral crackles at the base.  No wheezing.  No rhonchi.  ABDOMEN:  Bowel sounds positive.  Soft, nontender, and nondistended.  No  rigidity.  EXTREMITIES:  Pulse positive.  No edema.  NEUROLOGIC:  Alert and oriented X3.  Cranial nerves II through XII  intact.  Sensation grossly intact.   LABORATORY DATA:  White blood cells 10.4, hemoglobin 14.4, hematocrit  43.0, and platelet 226.  EKG; sinus tach, no ST changes, and poor R wave  progression.  Troponin 0.05, myoglobin 51.3, and CK-MB 1.3.  Chest x-  ray, mild chronic bronchitic changes with chronic right basilar  atelectasis.  INR 1.1 and PT 14.5.  CT head negative for intracranial  abnormalities.   ASSESSMENT AND PLAN:  1. Chest pain.  This is a 70 year old who presents with a typical  chest pain, concern for acute coronary syndrome is high.  We are      going to admit to the Step-Down Unit.  We are going to cycle      cardiac enzymes and repeat EKG.  We are going to risk stratify and      we are going to get a lipid profile and hemoglobin A1c.  We are      going to also check TSH in case of angina secondary to      hyperthyroidism.  We are going to start the patient on Lipitor,      heparin drip, metoprolol, and nitroglycerin.  The patient will need      a cardiac catheterization for diagnosis and therapeutic if needed.      Others in the differential, pulmonary embolism.  He denies any      recent travel or any recent surgery.  No cough, swelling, or      tenderness.  He has sinus tachycardia.  We are going to proceed and      get a CT angio to rule out pulmonary embolism.  Aortic dissection      is less likely, no widening of the mediastinum, bilateral good      pulse.  2. Hypertension.  We are going to start metoprolol.  We will add a      second medication if needed.  3. Asthma.   The patient does not have wheezing on lung exam and less      likely asthma exacerbation.  We are going to start albuterol as      needed.      Hartley Barefoot, MD  Electronically Signed      Darryl D. Prime, MD  Electronically Signed    BR/MEDQ  D:  01/15/2009  T:  01/16/2009  Job:  [id number]

## 2011-04-19 NOTE — Op Note (Signed)
NAMEDONAVAN, KERLIN              ACCOUNT NO.:  [id number]   MEDICAL RECORD NO.:  [id number]          PATIENT TYPE:  AMB   LOCATION:  DAY                           FACILITY:  [facility]   PHYSICIAN:  Ky Barban, M.D.DATE OF BIRTH:  May 02, 1941   DATE OF PROCEDURE:  03/27/2009  DATE OF DISCHARGE:                               OPERATIVE REPORT   PREOPERATIVE DIAGNOSIS:  Left ureteral calculus.   POSTOPERATIVE DIAGNOSIS:  Left ureteral calculus.   PROCEDURE:  Cystoscopy, left retrograde pyelogram, ureteroscopic stone  basket extraction, holmium laser lithotripsy, and insertion of double-J  stent.   ANESTHESIA:  Spinal.   PROCEDURE IN DETAIL:  The patient under spinal anesthesia in lithotomy  position.  After usual prep and drape, #25 cystoscope was introduced  into the bladder, it was inspected.  He does have an enlarged prostate  causing bladder neck obstruction.  Bladder was smooth, no tumor, stone,  foreign body, or inflammation.  The left ureteral orifice with a Wedge  catheter.  Hypaque was injected under fluoroscopic control.  The dyes  goes up into the mid ureter at level of L4, I can see slight obstruction  to the flow of the dye and that was what the stone was, although the  ureter above that slightly dilated, but I was not 100% sure if that was  the stone.  There was a little bit clubbing of the calices, otherwise  unremarkable.  Now, guidewire was passed under fluoroscopic control,  right at that point where the dye was a little stuck, and I can feel  there was a little obstruction.  I suspect that was where the stone was.  So, the guidewire was passed up into the renal pelvis.  The scope was  removed, balloon dilators introduced into the intramural ureter.  It was  dilated to #15, now the balloon was removed and short rigid ureteroscope  was introduced over the guidewire.  I went to the suspected level of the  stone and I can see there was a stone.  So, I using a laser  fiber under  direct vision, the stone was completed broken into smaller pieces.  Then, with the help of the basket, all the pieces were extracted without  any complications.  At the end, I looked into the ureter looks intact,  no problem with ureter.  The pieces fell into the bladder.  Later on, I  took through the cystoscope with the help of a biopsy forceps.  All the  instruments were removed except for the guidewire.  Then, I introduced  an open-end catheter over the guidewire.  One more time, I checked the  position of the guidewire, it was in the proper position.  Then, over  the guidewire, I introduced #5-French double J-stent, it was positioned  between the renal pelvis and the bladder.  The guidewire was removed.  The string was left attached to the double J-stent and was taped to his  private area.  All the instruments were removed.  The patient left the  operating room in satisfactory condition.      [employer]  Sydnee Cabal, M.D.  Electronically Signed    MIJ/MEDQ  D:  03/27/2009  T:  03/28/2009  Job:  [id number]

## 2011-04-19 NOTE — Discharge Summary (Signed)
Casey, Klein NO.:  [id number]   MEDICAL RECORD NO.:  [id number]          PATIENT TYPE:  INP   LOCATION:  2036                         FACILITY:  [facility]   PHYSICIAN:  Verne Carrow, MDDATE OF BIRTH:  07-Dec-1940   DATE OF ADMISSION:  01/15/2009  DATE OF DISCHARGE:  01/16/2009                               DISCHARGE SUMMARY   PRIMARY CARDIOLOGIST:  Rollene Rotunda, MD, [facility]   DISCHARGE DIAGNOSIS:  Noncardiac chest pain.   SECONDARY DIAGNOSES:  1. Coronary artery disease (nonobstructive, mild).  2. Hypertension.  3. Hyperlipidemia.  4. Asthma.   ALLERGIES:  PENICILLIN.   PROCEDURES PERFORMED DURING THIS HOSPITALIZATION:  The patient had an  EKG that showed sinus tachycardia with occasional premature atrial  complexes, ventricular rate was 106 beats per minute.  No acute ST-T  wave changes.  No significant Q-waves.  Normal axis.  No evidence of  hypertrophy.  PR interval 130, QRS 80, and QTc 425.   The patient had a chest x-ray performed on January 15, 2009, that  showed mild chronic bronchitic changes with minimal chronic right  basilar atelectasis.  The patient had a CT of the head without contrast  on January 15, 2009, this showed no acute intracranial abnormality,  probable acute-on-chronic sinusitis.   The patient had a CT angio of the chest on January 16, 2009, that  showed:  1. No CT evidence for pulmonary emboli.  2. Small mediastinal and hilar lymph nodes, but no adenopathy.  3. Normal CT appearance of the thoracic aorta.  4. No acute pulmonary findings.  5. Streaky bibasilar atelectasis.  6. Borderline common bile duct enlargement, unchanged since April      2008.   The patient had a cardiac catheterization on January 16, 2009, that  showed:  1. Mild nonobstructive coronary artery disease.  2. Hyperdynamic LV function with an EF approximately equal to 70%.   HISTORY OF PRESENT ILLNESS:  This is a 70 year old male with past  medical history significant for hypertension and asthma, presenting to  the emergency department complaining of generalized weakness and  lightheadedness and vertigo in the morning on admission.  The patient  also reports chest pain intermittent since December.  The chest pain is  approximately 5/10, achy in nature, that started while he was shoveling  snow  most recently.  The chest pain is located in the middle of the  chest and epigastric area.  It was associated with diaphoresis and  dyspnea.  The patient developed an episode of chest pain while being  evaluated in the emergency department, which he described as  approximately 9/10 in intensity, nonradiating, but associated with  diaphoresis and lightheadedness.  The patient also had one episode of  nausea and vomiting just prior to arrival to the hospital.  The patient  has a positive smoking history of one pack per day times last 15 years,  denies EtOH or illicit drugs.   FAMILY HISTORY:  Noncontributory.   HOSPITAL COURSE:  The patient was admitted to [facility] and had cardiac  enzymes cycled, the first two point-of-care markers were negative and  the first complete set of cardiac markers were negative, however, the  last set had elevated CK-MB with a relative index of 4.0.  The patient  was also found to have hyperlipidemia with a triglyceride level of 165,  an HDL level of 39, and LDL of 124.  The patient's compelling symptoms,  risk factors for cardiac disease as well as the laboratory findings led  to a plan for diagnostic cardiac catheterization, which the patient  tolerated well on January 16, 2009, without significant complications.  As the patient was found to have only mild nonobstructive coronary  artery disease but hyperdynamic LV function, the patient has been  scheduled to get a transthoracic 2-D echocardiogram, which will be  completed prior to his discharge.  However, as his symptoms have  significantly improved,  his discharge is planned for January 16, 2009,  after completion of the echo.  The patient was consistently hypertensive  during his hospital course and will remain on the beta blocker that he  was taking inpatient as well as will be started on Norvasc.  His  hypertension meds will be reevaluated at his followup visit scheduled  with Dr. Antoine Poche on February 09, 2009, at 1:45 p.m.  The patient will also  continue on statin due to his hyperlipidemia and baby/low-dose aspirin  due to his mild nonobstructive coronary artery disease.  The patient  will be given sublingual nitroglycerin as well to use as needed for  chest pain.   DISCHARGE LABS:  WBC 10.9, HGB 14.3, HCT 40.8, and PLT count 233.  Sodium 137, potassium 3.5, chloride 107, CO2 23, BUN 8, creatinine 0.2,  glucose 102, and calcium 8.5.  The last set of cardiac markers, CK 110,  CK-MB 4.4, relative index 4.0, troponin I less than 0.01, total  cholesterol 196, triglycerides 165, HDL 39, LDL 124, VLDL 33, TSH 0.756,  hemoglobin A1c 5.9, lipase 27, total bilirubin 0.9, alkaline phosphatase  50, AST 40, ALT 38, total protein 6.7, and albumin 3.7.   DISCHARGE MEDICATIONS:  1. Albuterol inhaler 2 puffs up to every 6 hours as needed for      shortness of breath and wheezing.  2. Aspirin 81 mg p.o. daily.  3. Simvastatin 40 mg p.o. daily.  4. Metoprolol 25 mg p.o. b.i.d.  5. Norvasc 5 mg p.o. daily.  6. Nitroglycerin 0.4 mg sublingual p.r.n. for chest pain.   FOLLOWUP PLANS AND APPOINTMENTS:  The patient has a followup appointment  with Dr. Antoine Poche on February 09, 2009, at 1:45 p.m.   Outstanding labs and studies, transthoracic 2-D echocardiogram, please  see addendum for results.  Duration of discharge encounter including  physician time is 45 minutes.      Jarrett Ables, [facility]      Verne Carrow, MD  Electronically Signed    MS/MEDQ  D:  01/16/2009  T:  01/17/2009  Job:  [id number]   cc:   Rollene Rotunda, MD, [facility]

## 2011-04-22 NOTE — Discharge Summary (Signed)
NAMEFITZHUGH, VIZCARRONDO NO.:  [id number]   MEDICAL RECORD NO.:  [id number]          PATIENT TYPE:  INP   LOCATION:  2036                         [facility]ACILITY[facility]  [facility]   PHYSICIAN:  Verne Carrow, MDDATE OF BIRTH:  [birth year]   DATE OF ADMISSION:  01/15/2009  DATE OF DISCHARGE:  01/16/2009                               DISCHARGE SUMMARY   ADDENDUM   PRIMARY CARDIOLOGIST:  Rollene Rotunda, MD, [facility]   REASON FOR THIS ADDENDUM:  Results of 2-D echocardiogram completed on  January 16, 2009, show;  1. Technically difficult study with poor acoustic windows.  2. The LV contracts vigorously with mid-cavity systolic obliteration.      There appears to be an LV mid-cavity gradient approaching peak 90      mmHg.  This does not appear to be due to contamination from mitral      regurgitation signal, as there does not appear to be any      significant MR.  3. There was no LV regional wall motion abnormalities.  LV wall      thickness was increased in a pattern of mild concentric      hypertrophy.      Jarrett Ables, [facility]      Verne Carrow, MD  Electronically Signed    MS/MEDQ  D:  01/19/2009  T:  01/20/2009  Job:  [id number]

## 2011-04-27 NOTE — Op Note (Signed)
Casey Klein, Casey Klein              ACCOUNT NO.:  [id number]  MEDICAL RECORD NO.:  [id number]           PATIENT TYPE:  I  LOCATION:  3534                         FACILITY:  [facility]  PHYSICIAN:  Cristi Loron, M.D.DATE OF BIRTH:  [birth year]  DATE OF PROCEDURE:  04/18/2011 DATE OF DISCHARGE:                              OPERATIVE REPORT   BRIEF HISTORY:  The patient is a 70 year old white male who has suffered from back and left leg pain consistent with a left L5 radiculopathy.  He has failed medical management, was worked up with a lumbar MRI which demonstrated herniated disk at L4-L5 on the left.  I discussed various treatment options with the patient including surgery.  He has weighed the risks, benefits, and alternatives of surgery, decided to proceed with a left L4-L5 diskectomy.  PREOPERATIVE DIAGNOSIS:  Left L4-L5 herniated nuclear pulposus, spinal stenosis, lumbar radiculopathy, lumbago.  POSTOPERATIVE DIAGNOSIS:  Left L4-L5 herniated nuclear pulposus, spinal stenosis, lumbar radiculopathy, lumbago.  PROCEDURE:  Left L4-L5 diskectomy using microdissection.  SURGEON:  Cristi Loron, MD  ASSISTANT:  Hilda Lias, MD  ANESTHESIA:  General endotracheal.  ESTIMATED BLOOD LOSS:  50 mL.  SPECIMENS:  None.  DRAINS:  None.  COMPLICATIONS:  None.  DESCRIPTION OF PROCEDURE:  The patient was brought to the operating room by the Anesthesia team.  General endotracheal anesthesia was induced. The patient was turned to the prone position on the Wilson frame.  His lumbosacral region was then prepared with Betadine scrub and Betadine solution.  Sterile drapes were applied and then injected the area to be incised with Marcaine with epinephrine solution.  I used a scalpel to make a linear midline incision over the L4-L5 interspace.  I used electrocautery to perform a left-sided subperiosteal dissection, exposing the spinous process of lamina of L4 and L5.  We  obtained intraoperative radiograph to confirm location and inserted a McCullough retractor for exposure.  We then brought the operative microscope into the field and under its magnification and illumination, we completed the microdissection/decompression.  I used a high-speed drill to perform a left L4 laminotomy.  I widened the laminotomies with the Kerrison punch, removed the left L4-L5 ligamentum flavum, and performed a foraminotomy about the L5 nerve root.  I then used microdissection to free up the nerve root and the thecal sac from the epidural tissue.  Then, Dr. Jeral Fruit gently retracted the neural structures medially with a D'Errico retractor.  This exposed the underlying disk herniation at the disk space.  I incised into the disk herniation and removed it with the Kerrison punch and performed a partial intervertebral diskectomy with the pituitary forceps and the Epstein curettes.  After I was satisfied with the intervertebral diskectomy, I then used the osteophyte tool to remove some redundant posterior longitudinal ligament from the vertebral endplates at L4-L5 further decompressing the neural elements.  I then palpated along the ventral surface of the thecal sac along the exit route of the L5 nerve root and noted neural structure well decompressed. I then obtained hemostasis using bipolar electrocautery.  I irrigated the wound out with bacitracin solution.  I then removed  the retractor and then reapproximated the patient's thoracolumbar fascia with interrupted #1 Vicryl sutures, subcutaneous tissue with interrupted 2-0 Vicryl suture, and the skin with Steri-Strips and Benzoin.  The wound was then coated with bacitracin ointment.  A sterile dressing applied, the drapes were removed, and the patient was subsequently returned to supine position where he was extubated by the Anesthesia team and transported to postanesthesia care unit in stable condition.  All sponge, instrument,  and needle counts were correct at the end of this case.     Cristi Loron, M.D.     JDJ/MEDQ  D:  04/18/2011  T:  04/19/2011  Job:  [id number]  Electronically Signed by Tressie Stalker M.D. on 04/27/2011 02:27:44 PM

## 2011-09-15 LAB — B-NATRIURETIC PEPTIDE (CONVERTED LAB): Pro B Natriuretic peptide (BNP): <30

## 2013-02-03 ENCOUNTER — Emergency Department (HOSPITAL_COMMUNITY)
Admission: EM | Admit: 2013-02-03 | Discharge: 2013-02-03 | Disposition: A | Discharge status: Home / Self Care | Payer: Medicare Other | Attending: Emergency Medicine | Admitting: Emergency Medicine

## 2013-02-03 ENCOUNTER — Emergency Department (HOSPITAL_COMMUNITY): Payer: Medicare Other

## 2013-02-03 ENCOUNTER — Encounter (HOSPITAL_COMMUNITY): Payer: Self-pay | Admitting: *Deleted

## 2013-02-03 DIAGNOSIS — R0789 Other chest pain: Secondary | ICD-10-CM | POA: Insufficient documentation

## 2013-02-03 DIAGNOSIS — Z9889 Other specified postprocedural states: Secondary | ICD-10-CM | POA: Insufficient documentation

## 2013-02-03 DIAGNOSIS — R079 Chest pain, unspecified: Secondary | ICD-10-CM

## 2013-02-03 LAB — CBC
HCT: 39.8 % (ref 39.0–52.0)
MCHC: 34.4 g/dL (ref 30.0–36.0)
MCV: 85 fL (ref 78.0–100.0)
Platelets: 244 10*3/uL (ref 150–400)
RDW: 15.2 % (ref 11.5–15.5)

## 2013-02-03 LAB — COMPREHENSIVE METABOLIC PANEL
AST: 23 U/L (ref 0–37)
Albumin: 3.9 g/dL (ref 3.5–5.2)
BUN: 11 mg/dL (ref 6–23)
Calcium: 9.2 mg/dL (ref 8.4–10.5)
Creatinine, Ser: 0.87 mg/dL (ref 0.50–1.35)
Total Protein: 7.2 g/dL (ref 6.0–8.3)

## 2013-02-03 LAB — TROPONIN I: Troponin I: <0.3 ng/mL (ref <0.30)

## 2013-02-03 MED ORDER — SODIUM CHLORIDE 0.9 % IV SOLN
20.0000 mL | INTRAVENOUS | Status: DC
Start: 2013-02-03 — End: 2013-02-03

## 2013-02-03 NOTE — ED Provider Notes (Signed)
History     CSN: [id number]  Arrival date & time 02/03/13  1352   First MD Initiated Contact with Patient 02/03/13 1419      Chief Complaint  Patient presents with  . Chest Pain    (Consider location/radiation/quality/duration/timing/severity/associated sxs/prior treatment) HPI The patient presents with concern of chest pain.  The pain began 2 weeks ago while the patient was shoveling.  Since onset the pain has been intermittent, and on my exam the patient has no pain.  He states that since that initial profound episode of pain episodes have been recurring without clear precipitant, and his without any intervention.  The pain is substernal, pressure-like, with radiation to left arm.  There is no associated lightheadedness, syncope, nausea, vomiting. The patient has a history of catheterization, but no prior intervention that he recalls.  No past medical history on file.  No past surgical history on file.  No family history on file.  History  Substance Use Topics  . Smoking status: Not on file  . Smokeless tobacco: Not on file  . Alcohol Use: Not on file      Review of Systems  Constitutional:       Per HPI, otherwise negative  HENT:       Per HPI, otherwise negative  Respiratory:       Per HPI, otherwise negative  Cardiovascular:       Per HPI, otherwise negative  Gastrointestinal: Negative for vomiting.  Endocrine:       Negative aside from HPI  Genitourinary:       Neg aside from HPI   Musculoskeletal:       Per HPI, otherwise negative  Skin: Negative.   Neurological: Negative for syncope.    Allergies  Penicillins  Home Medications   Current Outpatient Rx  Name  Route  Sig  Dispense  Refill  . acetaminophen (TYLENOL) 500 MG tablet   Oral   Take 500-1,000 mg by mouth every 6 (six) hours as needed for pain.           BP 138/85  Pulse 80  Temp(Src) 97.6 F (36.4 C) (Oral)  SpO2 97%  Physical Exam  Nursing note and vitals  reviewed. Constitutional: He is oriented to person, place, and time. He appears well-developed. No distress.  HENT:  Head: Normocephalic and atraumatic.  Eyes: Conjunctivae and EOM are normal.  Cardiovascular: Normal rate and regular rhythm.   Pulmonary/Chest: Effort normal. No stridor. No respiratory distress.  Abdominal: He exhibits no distension.  Musculoskeletal: He exhibits no edema.  Neurological: He is alert and oriented to person, place, and time.  Skin: Skin is warm and dry.  Psychiatric: He has a normal mood and affect.    ED Course  Procedures (including critical care time)  Labs Reviewed  CBC  COMPREHENSIVE METABOLIC PANEL  TROPONIN I   Dg Chest 2 View  02/03/2013  *RADIOLOGY REPORT*  Clinical Data: Left chest pain  CHEST - 2 VIEW  Comparison: 12/27/2010  Findings: Mild left basilar atelectasis.  No pleural effusion or pneumothorax.  Cardiomediastinal silhouette is within normal limits.  Mild elevation of the left hemidiaphragm.  Mild degenerative changes of the visualized thoracolumbar spine.  IMPRESSION: No evidence of acute cardiopulmonary disease.   Original Report Authenticated By: Charline Bills, M.D.    A review of the patient's chart demonstrates prior catheterization within the past 5 years with LAD occlusion of 30%, echocardiogram with preserved ejection fraction.  No diagnosis found.  Cardiac 80 sinus  rhythm normal  O2- 99%ra, normal   Date: 02/03/2013  Rate: 80  Rhythm: normal sinus rhythm  QRS Axis: normal  Intervals: normal  ST/T Wave abnormalities: normal  Conduction Disutrbances:PVC  Narrative Interpretation:   Old EKG Reviewed: none available  BORDERLINE   5:36 PM I discussed all thoughts, findings with the patient and his grandson.  I advised the patient should stay overnight for evaluation.  The patient deferred this suggestion.  Stated a preference to go home, followup as an outpatient with his cardiologist in another city.  Given the  patient's capacity to make this decision this was accommodated.  MDM  This pleasant male presents with ongoing intermittent chest pain description of pain has been on and off for 6 weeks, and the absence of any chest pain while here is reassuring, but with the patient's age, concern for ACS, he had laboratory studies.  Chart review demonstrates presence of minor heart disease recently.  The patient's evaluation here is largely unremarkable, with a nonischemic ECG, labs and a chest x-ray.  I discussed these findings with the patient, advised for inpatient evaluation given his comorbidities.  After the patient deferred this recommendation, he was discharged in stable condition to follow up promptly with his cardiologist.      Gerhard Munch, MD 02/03/13 [phone]

## 2013-02-03 NOTE — ED Notes (Signed)
Pt called [facility] CP x 1 day. Meds per EMS 1 nitro . 324 mg ASA and 4mg  Morphine. Pain 1/10.

## 2013-02-04 ENCOUNTER — Telehealth: Payer: Self-pay | Admitting: Cardiology

## 2013-02-04 NOTE — Telephone Encounter (Signed)
Rescheduled pts appt to the [facility] office for the eval of chest pain

## 2013-02-04 NOTE — Telephone Encounter (Signed)
New Problem:    Patient called in wanting to speak with you about his recent ED visit.  Please call back.

## 2013-02-06 ENCOUNTER — Encounter: Payer: Self-pay | Admitting: Cardiology

## 2013-02-06 ENCOUNTER — Ambulatory Visit (INDEPENDENT_AMBULATORY_CARE_PROVIDER_SITE_OTHER): Payer: Medicare Other | Admitting: Cardiology

## 2013-02-06 VITALS — BP 164/103 | HR 95 | Ht 69.0 in | Wt 195.0 lb

## 2013-02-06 DIAGNOSIS — I2510 Atherosclerotic heart disease of native coronary artery without angina pectoris: Secondary | ICD-10-CM

## 2013-02-06 DIAGNOSIS — R0989 Other specified symptoms and signs involving the circulatory and respiratory systems: Secondary | ICD-10-CM

## 2013-02-06 MED ORDER — AMLODIPINE BESYLATE 5 MG PO TABS: 5.0000 mg | ORAL_TABLET | Freq: Every day | ORAL | Status: DC

## 2013-02-06 NOTE — Patient Instructions (Addendum)
Start Amlodipine 5 mg a day Continue other medications as listed  Your physician has requested that you have a lexiscan myoview. For further information please visit [email or web address] Please follow instruction sheet, as given.  Your physician has requested that you have a carotid duplex. This test is an ultrasound of the carotid arteries in your neck. It looks at blood flow through these arteries that supply the brain with blood. Allow one hour for this exam. There are no restrictions or special instructions.  Follow up with Dr Antoine Poche in  2 months.

## 2013-02-06 NOTE — Progress Notes (Signed)
   HPI The patient presents for evaluation of chest pain. He has had a cardiac catheterization in the past demonstrating 30% LAD stenosis. His last evaluation was a Myoview 2012 prior to having back surgery. This demonstrated no  Ischemia or infarct.he presents now as he was recently in the emergency room. I have reviewed these records from earlier this month. He had one episode of chest discomfort. This was substernal. He had some arm discomfort. It happened while he was moving a little snow. He has not had any recurrence of this. In the emergency room he was not found to have any objective evidence of ischemia. Enzymes were negative. He did not want to be admitted. He does have increasing dyspnea walking up an incline. He's not describing any resting PND or orthopnea. He has had some arm numbness. He's not describing other associated symptoms. It's not clear whether he has ever had these symptoms before. He does not go to see his doctor. He stopped taking his medications including metoprolol.  Allergies  Allergen Reactions  . Penicillins Other (See Comments)    unknown    Current Outpatient Prescriptions  Medication Sig Dispense Refill  . acetaminophen (TYLENOL) 500 MG tablet Take 500-1,000 mg by mouth every 6 (six) hours as needed for pain.       No current facility-administered medications for this visit.    ROS:  As stated in the HPI and negative for all other systems.  PHYSICAL EXAM BP 164/103  Pulse 95  Ht 5\' 9"  (1.753 m)  Wt 195 lb (88.451 kg)  BMI 28.78 kg/m2 GENERAL:  Well appearing HEENT:  Pupils equal round and reactive, fundi not visualized, oral mucosa unremarkable NECK:  No jugular venous distention, waveform within normal limits, carotid upstroke brisk and symmetric, no bruits, no thyromegaly LYMPHATICS:  No cervical, inguinal adenopathy LUNGS:  Clear to auscultation bilaterally BACK:  No CVA tenderness CHEST:  Unremarkable HEART:  PMI not displaced or sustained,S1 and  S2 within normal limits, no S3, no S4, no clicks, no rubs, no murmurs ABD:  Flat, positive bowel sounds normal in frequency in pitch, no bruits, no rebound, no guarding, no midline pulsatile mass, no hepatomegaly, no splenomegaly EXT:  2 plus pulses throughout, no edema, no cyanosis no clubbing SKIN:  No rashes no nodules NEURO:  Cranial nerves II through XII grossly intact, motor grossly intact throughout PSYCH:  Cognitively intact, oriented to person place and time  EKG:  Normal sinus rhythm, rate 80, axis within normal limits, intervals within normal limits, PVCs.  ASSESSMENT AND PLAN  CHEST PAIN:  His pain has some typical and some atypical features. Stress testing is indicated. He says he would not be able to walk on a treadmill. Therefore, he will have a [employer].  HTN:  I told him of the importance of controlling this. I'm going to start him on Norvasc 5 mg daily.  CAROTID STENOSIS:  He is overdue for followup of this. I will arrange this.  PAT:  He has had no symptomatic recurrence of this. No further evaluation is planned.

## 2013-02-25 ENCOUNTER — Encounter (INDEPENDENT_AMBULATORY_CARE_PROVIDER_SITE_OTHER): Payer: Medicare Other

## 2013-02-25 ENCOUNTER — Ambulatory Visit (HOSPITAL_COMMUNITY): Payer: Medicare Other | Attending: Cardiology | Admitting: Radiology

## 2013-02-25 VITALS — BP 144/88 | HR 79 | Ht 69.0 in | Wt 195.0 lb

## 2013-02-25 DIAGNOSIS — I6529 Occlusion and stenosis of unspecified carotid artery: Secondary | ICD-10-CM

## 2013-02-25 DIAGNOSIS — I2510 Atherosclerotic heart disease of native coronary artery without angina pectoris: Secondary | ICD-10-CM

## 2013-02-25 DIAGNOSIS — R5383 Other fatigue: Secondary | ICD-10-CM | POA: Insufficient documentation

## 2013-02-25 DIAGNOSIS — R42 Dizziness and giddiness: Secondary | ICD-10-CM | POA: Insufficient documentation

## 2013-02-25 DIAGNOSIS — R5381 Other malaise: Secondary | ICD-10-CM | POA: Insufficient documentation

## 2013-02-25 DIAGNOSIS — R0989 Other specified symptoms and signs involving the circulatory and respiratory systems: Secondary | ICD-10-CM | POA: Insufficient documentation

## 2013-02-25 DIAGNOSIS — R0602 Shortness of breath: Secondary | ICD-10-CM

## 2013-02-25 DIAGNOSIS — R079 Chest pain, unspecified: Secondary | ICD-10-CM

## 2013-02-25 DIAGNOSIS — R0609 Other forms of dyspnea: Secondary | ICD-10-CM | POA: Insufficient documentation

## 2013-02-25 DIAGNOSIS — R0789 Other chest pain: Secondary | ICD-10-CM | POA: Insufficient documentation

## 2013-02-25 MED ORDER — TECHNETIUM TC 99M SESTAMIBI GENERIC - CARDIOLITE
11.0000 | Freq: Once | INTRAVENOUS | Status: AC | PRN
  Administered 2013-02-25: 11 via INTRAVENOUS

## 2013-02-25 MED ORDER — REGADENOSON 0.4 MG/5ML IV SOLN
0.4000 mg | Freq: Once | INTRAVENOUS | Status: AC
  Administered 2013-02-25: 0.4 mg via INTRAVENOUS

## 2013-02-25 MED ORDER — TECHNETIUM TC 99M SESTAMIBI GENERIC - CARDIOLITE
33.0000 | Freq: Once | INTRAVENOUS | Status: AC | PRN
  Administered 2013-02-25: 33 via INTRAVENOUS

## 2013-02-25 NOTE — Progress Notes (Signed)
[facility] SITE 3 NUCLEAR MED [address] [place], [place] [address] [phone]    Cardiology Nuclear Med Study  Casey Klein is a 72 y.o. male     MRN : [id number]     DOB: 12-06-1940  Procedure Date: 02/25/2013  Nuclear Med Background Indication for Stress Test:  Evaluation for Ischemia History:  '10 Echo:EF=75%; '10 Cath:n/o CAD, EF=70%; 4/12 MWU:[id number] Cardiac Risk Factors: Carotid Bruit, Family History - CAD, History of Smoking and Hypertension  Symptoms:  Chest Pressure>(L) Arm with and without Exertion (last episode of chest discomfort was yesterday), Diaphoresis, Dizziness, DOE/SOB and Fatigue    Nuclear Pre-Procedure Caffeine/Decaff Intake:  None NPO After: 7:30am   Lungs:  Clear. O2 Sat: 97% on room air. IV 0.9% NS with Angio Cath:  20g  IV Site: R Forearm  IV Started by:  Stanton Kidney, EMT-P  Chest Size (in):  42 Cup Size: n/a  Height: 5\' 9"  (1.753 m)  Weight:  195 lb (88.451 kg)  BMI:  Body mass index is 28.78 kg/(m^2). Tech Comments:  NA    Nuclear Med Study 1 or 2 day study: 1 day  Stress Test Type:  Lexiscan  Reading MD: Charlton Haws, MD  Order Authorizing Provider:  Rollene Rotunda, MD  Resting Radionuclide: Technetium 70m Sestamibi  Resting Radionuclide Dose: 11.0 mCi   Stress Radionuclide:  Technetium 64m Sestamibi  Stress Radionuclide Dose: 33.0 mCi           Stress Protocol Rest HR: 79 Stress HR: 104  Rest BP: 144/88 Stress BP: 158/90  Exercise Time (min): n/a METS: n/a   Predicted Max HR: 147 bpm % Max HR: 70.75 bpm Rate Pressure Product: 10272   Dose of Adenosine (mg):  n/a Dose of Lexiscan: 0.4 mg  Dose of Atropine (mg): n/a Dose of Dobutamine: n/a mcg/kg/min (at max HR)  Stress Test Technologist: Smiley Houseman, CMA-N  Nuclear Technologist:  Domenic Polite, CNMT     Rest Procedure:  Myocardial perfusion imaging was performed at rest 45 minutes following the intravenous administration of Technetium 76m Sestamibi.  Rest  ECG: NSR - Normal EKG  Stress Procedure:  The patient received IV Lexiscan 0.4 mg over 15-seconds.  He did c/o chest pressure and neck tightness with Lexiscan.  Technetium 6m Sestamibi injected at 30-seconds.  Quantitative spect images were obtained after a 45 minute delay.  Stress ECG: No significant change from baseline ECG  QPS Raw Data Images:  Normal; no motion artifact; normal heart/lung ratio. Stress Images:  Normal homogeneous uptake in all areas of the myocardium. Rest Images:  Normal homogeneous uptake in all areas of the myocardium. Subtraction (SDS):  Normal Transient Ischemic Dilatation (Normal <1.22):  0.99 Lung/Heart Ratio (Normal <0.45):  0.34  Quantitative Gated Spect Images QGS EDV:  90 ml QGS ESV:  36 ml  Impression Exercise Capacity:  Lexiscan with no exercise. BP Response:  Normal blood pressure response. Clinical Symptoms:  Mild chest pain/dyspnea. ECG Impression:  No significant ST segment change suggestive of ischemia. Comparison with Prior Nuclear Study: No images to compare  Overall Impression:  Normal stress nuclear study.  LV Ejection Fraction: 60%.  LV Wall Motion:  NL LV Function; NL Wall Motion  Charlton Haws

## 2013-02-26 ENCOUNTER — Ambulatory Visit: Payer: Medicare Other | Admitting: Cardiology

## 2013-03-01 ENCOUNTER — Telehealth: Payer: Self-pay | Admitting: Cardiology

## 2013-03-01 NOTE — Telephone Encounter (Signed)
New problem   Pt had carotid done mon 02/26/12 and want to know if you have results.

## 2013-03-01 NOTE — Telephone Encounter (Signed)
Carotid doppler results 0-39% bilaterally and stable  No changes .  Lexiscan normal - no evidence of ischemia  Pt aware of both.  States today while eating at the [employer] in [place] he had a pain in his chest that took his breath.  It resolved on its own in a very short time.  He has had no other episodes.  Pt instructed to contact us if this should occur again.  He states understanding.

## 2013-04-17 ENCOUNTER — Encounter: Payer: Self-pay | Admitting: Cardiology

## 2013-04-17 ENCOUNTER — Ambulatory Visit (INDEPENDENT_AMBULATORY_CARE_PROVIDER_SITE_OTHER): Payer: Medicare Other | Admitting: Cardiology

## 2013-04-17 VITALS — BP 167/86 | HR 66 | Ht 69.0 in | Wt 195.0 lb

## 2013-04-17 DIAGNOSIS — R0602 Shortness of breath: Secondary | ICD-10-CM

## 2013-04-17 DIAGNOSIS — I6529 Occlusion and stenosis of unspecified carotid artery: Secondary | ICD-10-CM

## 2013-04-17 DIAGNOSIS — I2510 Atherosclerotic heart disease of native coronary artery without angina pectoris: Secondary | ICD-10-CM

## 2013-04-17 DIAGNOSIS — I10 Essential (primary) hypertension: Secondary | ICD-10-CM

## 2013-04-17 MED ORDER — AMLODIPINE BESYLATE 10 MG PO TABS: 10.0000 mg | ORAL_TABLET | Freq: Every day | ORAL | Status: DC

## 2013-04-17 NOTE — Patient Instructions (Addendum)
Please increase your Amlodipine to 10 mg a day Continue all other medications as listed  Follow up with Dr. Antoine Poche as needed.  Please schedule a follow up appointment with your primary care doctor.

## 2013-04-17 NOTE — Progress Notes (Signed)
   HPI The patient presents for evaluation of chest pain. He has had a cardiac catheterization in the past demonstrating 30% LAD stenosis. At the time of the last visit he had seen his primary care doctor in several years and still has not been back. He had stopped taking his medications but he is taking the meds as listed. I did send him for a stress perfusion study which demonstrated no evidence of ischemia or infarct. His EF was well-preserved. He also had carotid Dopplers which demonstrated only mild plaque on the right. He says he has since had no recurrent chest discomfort, neck or arm discomfort. He has had no palpitations, presyncope or syncope. He denies any PND or orthopnea. He has some chronic dyspnea with exertion. He does report pain with his hip is somewhat limits him. Of note I did start him on Norvasc at the last appointment and he has been compliant with this and tolerated this.  Allergies  Allergen Reactions  . Penicillins Other (See Comments)    unknown    Current Outpatient Prescriptions  Medication Sig Dispense Refill  . acetaminophen (TYLENOL) 500 MG tablet Take 500-1,000 mg by mouth every 6 (six) hours as needed for pain.      Marland Kitchen amLODipine (NORVASC) 5 MG tablet Take 1 tablet (5 mg total) by mouth daily.  30 tablet  11   No current facility-administered medications for this visit.    ROS:  As stated in the HPI and negative for all other systems.  PHYSICAL EXAM BP 167/86  Pulse 66  Ht 5\' 9"  (1.753 m)  Wt 195 lb (88.451 kg)  BMI 28.78 kg/m2 GENERAL:  Well appearing HEENT:  Pupils equal round and reactive, fundi not visualized, oral mucosa unremarkable NECK:  No jugular venous distention, waveform within normal limits, carotid upstroke brisk and symmetric, no bruits, no thyromegaly LYMPHATICS:  No cervical, inguinal adenopathy LUNGS:  Clear to auscultation bilaterally BACK:  No CVA tenderness CHEST:  Unremarkable HEART:  PMI not displaced or sustained,S1 and S2  within normal limits, no S3, no S4, no clicks, no rubs, no murmurs ABD:  Flat, positive bowel sounds normal in frequency in pitch, no bruits, no rebound, no guarding, no midline pulsatile mass, no hepatomegaly, no splenomegaly EXT:  2 plus pulses throughout, no edema, no cyanosis no clubbing SKIN:  No rashes no nodules NEURO:  Cranial nerves II through XII grossly intact, motor grossly intact throughout PSYCH:  Cognitively intact, oriented to person place and time   ASSESSMENT AND PLAN  CHEST PAIN:  The patient has no new sypmtoms.  No further cardiovascular testing is indicated.  We will continue with aggressive risk reduction and meds as listed.  HTN:  I told him of the importance of controlling this. I will increase the Norvasc to 10 mg daily. I have asked him to follow with his primary provider.  CAROTID STENOSIS:  This was mild been no further imaging is indicated.  PAT:  He has had no symptomatic recurrence of this. No further evaluation is planned.

## 2013-04-26 ENCOUNTER — Encounter (HOSPITAL_COMMUNITY): Payer: Self-pay | Admitting: Pharmacy Technician

## 2013-04-30 ENCOUNTER — Encounter (HOSPITAL_COMMUNITY): Payer: Self-pay

## 2013-04-30 ENCOUNTER — Encounter (HOSPITAL_COMMUNITY)
Admission: RE | Admit: 2013-04-30 | Discharge: 2013-04-30 | Disposition: A | Discharge status: Home / Self Care | Payer: Medicare Other | Source: Ambulatory Visit | Attending: Ophthalmology | Admitting: Ophthalmology

## 2013-04-30 HISTORY — DX: Personal history of urinary calculi: Z87.442

## 2013-04-30 LAB — BASIC METABOLIC PANEL
BUN: 13 mg/dL (ref 6–23)
Chloride: 104 mEq/L (ref 96–112)
GFR calc Af Amer: >90 mL/min (ref >90)
Glucose, Bld: 117 mg/dL — ABNORMAL HIGH (ref 70–99)
Potassium: 4 mEq/L (ref 3.5–5.1)
Sodium: 141 mEq/L (ref 135–145)

## 2013-04-30 LAB — HEMOGLOBIN AND HEMATOCRIT, BLOOD: HCT: 41.4 % (ref 39.0–52.0)

## 2013-04-30 MED ORDER — CYCLOPENTOLATE-PHENYLEPHRINE 0.2-1 % OP SOLN
OPHTHALMIC | Status: AC
  Filled 2013-04-30: qty 2

## 2013-04-30 NOTE — Patient Instructions (Addendum)
Your procedure is scheduled on: 05/06/2013  Report to [facility] at  800   AM.  Call this number if you have problems the morning of surgery: [phone]   Do not eat food or drink liquids :After Midnight.      Take these medicines the morning of surgery with A SIP OF WATER: amlodipine   Do not wear jewelry, make-up or nail polish.  Do not wear lotions, powders, or perfumes.  Do not shave 48 hours prior to surgery.  Do not bring valuables to the hospital.  Contacts, dentures or bridgework may not be worn into surgery.  Leave suitcase in the car. After surgery it may be brought to your room.  For patients admitted to the hospital, checkout time is 11:00 AM the day of discharge.   Patients discharged the day of surgery will not be allowed to drive home.  :     Please read over the following fact sheets that you were given: Coughing and Deep Breathing, Surgical Site Infection Prevention, Anesthesia Post-op Instructions and Care and Recovery After Surgery    Cataract A cataract is a clouding of the lens of the eye. When a lens becomes cloudy, vision is reduced based on the degree and nature of the clouding. Many cataracts reduce vision to some degree. Some cataracts make people more near-sighted as they develop. Other cataracts increase glare. Cataracts that are ignored and become worse can sometimes look white. The white color can be seen through the pupil. CAUSES   Aging. However, cataracts may occur at any age, even in newborns.   Certain drugs.   Trauma to the eye.   Certain diseases such as diabetes.   Specific eye diseases such as chronic inflammation inside the eye or a sudden attack of a rare form of glaucoma.   Inherited or acquired medical problems.  SYMPTOMS   Gradual, progressive drop in vision in the affected eye.   Severe, rapid visual loss. This most often happens when trauma is the cause.  DIAGNOSIS  To detect a cataract, an eye doctor examines the lens. Cataracts  are best diagnosed with an exam of the eyes with the pupils enlarged (dilated) by drops.  TREATMENT  For an early cataract, vision may improve by using different eyeglasses or stronger lighting. If that does not help your vision, surgery is the only effective treatment. A cataract needs to be surgically removed when vision loss interferes with your everyday activities, such as driving, reading, or watching TV. A cataract may also have to be removed if it prevents examination or treatment of another eye problem. Surgery removes the cloudy lens and usually replaces it with a substitute lens (intraocular lens, IOL).  At a time when both you and your doctor agree, the cataract will be surgically removed. If you have cataracts in both eyes, only one is usually removed at a time. This allows the operated eye to heal and be out of danger from any possible problems after surgery (such as infection or poor wound healing). In rare cases, a cataract may be doing damage to your eye. In these cases, your caregiver may advise surgical removal right away. The vast majority of people who have cataract surgery have better vision afterward. HOME CARE INSTRUCTIONS  If you are not planning surgery, you may be asked to do the following:  Use different eyeglasses.   Use stronger or brighter lighting.   Ask your eye doctor about reducing your medicine dose or changing medicines if  it is thought that a medicine caused your cataract. Changing medicines does not make the cataract go away on its own.   Become familiar with your surroundings. Poor vision can lead to injury. Avoid bumping into things on the affected side. You are at a higher risk for tripping or falling.   Exercise extreme care when driving or operating machinery.   Wear sunglasses if you are sensitive to bright light or experiencing problems with glare.  SEEK IMMEDIATE MEDICAL CARE IF:   You have a worsening or sudden vision loss.   You notice redness,  swelling, or increasing pain in the eye.   You have a fever.  Document Released: 11/21/2005 Document Revised: 11/10/2011 Document Reviewed: 07/15/2011 [facility] Patient Information 2012 [place].PATIENT INSTRUCTIONS POST-ANESTHESIA  IMMEDIATELY FOLLOWING SURGERY:  Do not drive or operate machinery for the first twenty four hours after surgery.  Do not make any important decisions for twenty four hours after surgery or while taking narcotic pain medications or sedatives.  If you develop intractable nausea and vomiting or a severe headache please notify your doctor immediately.  FOLLOW-UP:  Please make an appointment with your surgeon as instructed. You do not need to follow up with anesthesia unless specifically instructed to do so.  WOUND CARE INSTRUCTIONS (if applicable):  Keep a dry clean dressing on the anesthesia/puncture wound site if there is drainage.  Once the wound has quit draining you may leave it open to air.  Generally you should leave the bandage intact for twenty four hours unless there is drainage.  If the epidural site drains for more than 36-48 hours please call the anesthesia department.  QUESTIONS?:  Please feel free to call your physician or the hospital operator if you have any questions, and they will be happy to assist you.

## 2013-05-06 ENCOUNTER — Encounter (HOSPITAL_COMMUNITY): Payer: Self-pay | Admitting: Anesthesiology

## 2013-05-06 ENCOUNTER — Ambulatory Visit (HOSPITAL_COMMUNITY)
Admission: RE | Admit: 2013-05-06 | Discharge: 2013-05-06 | Disposition: A | Discharge status: Skilled Nursing Facility | Payer: Medicare Other | Source: Ambulatory Visit | Attending: Ophthalmology | Admitting: Ophthalmology

## 2013-05-06 ENCOUNTER — Encounter (HOSPITAL_COMMUNITY)
Admission: RE | Disposition: A | Discharge status: Skilled Nursing Facility | Payer: Self-pay | Source: Ambulatory Visit | Attending: Ophthalmology

## 2013-05-06 ENCOUNTER — Ambulatory Visit (HOSPITAL_COMMUNITY): Payer: Medicare Other | Admitting: Anesthesiology

## 2013-05-06 ENCOUNTER — Encounter (HOSPITAL_COMMUNITY): Payer: Self-pay | Admitting: *Deleted

## 2013-05-06 DIAGNOSIS — H2510 Age-related nuclear cataract, unspecified eye: Secondary | ICD-10-CM | POA: Insufficient documentation

## 2013-05-06 DIAGNOSIS — Z01812 Encounter for preprocedural laboratory examination: Secondary | ICD-10-CM | POA: Insufficient documentation

## 2013-05-06 DIAGNOSIS — I10 Essential (primary) hypertension: Secondary | ICD-10-CM | POA: Insufficient documentation

## 2013-05-06 HISTORY — PX: CATARACT EXTRACTION W/PHACO: SHX586

## 2013-05-06 SURGERY — PHACOEMULSIFICATION, CATARACT, WITH IOL INSERTION
Anesthesia: Monitor Anesthesia Care | Site: Eye | Laterality: Right | Wound class: Clean

## 2013-05-06 MED ORDER — MIDAZOLAM HCL 2 MG/2ML IJ SOLN
INTRAMUSCULAR | Status: AC
  Filled 2013-05-06: qty 2

## 2013-05-06 MED ORDER — KETOROLAC TROMETHAMINE 0.4 % OP SOLN - NO CHARGE
1.0000 [drp] | Freq: Once | OPHTHALMIC | Status: AC
  Administered 2013-05-06: 1 [drp] via OPHTHALMIC
  Filled 2013-05-06: qty 5

## 2013-05-06 MED ORDER — CYCLOPENTOLATE-PHENYLEPHRINE 0.2-1 % OP SOLN
1.0000 [drp] | OPHTHALMIC | Status: AC
  Administered 2013-05-06: 1 [drp] via OPHTHALMIC

## 2013-05-06 MED ORDER — TETRACAINE 0.5 % OP SOLN OPTIME - NO CHARGE
OPHTHALMIC | Status: DC | PRN
  Administered 2013-05-06: 2 [drp] via OPHTHALMIC

## 2013-05-06 MED ORDER — POVIDONE-IODINE 5 % OP SOLN
OPHTHALMIC | Status: DC | PRN
  Administered 2013-05-06: 1 via OPHTHALMIC

## 2013-05-06 MED ORDER — GATIFLOXACIN 0.5 % OP SOLN OPTIME - NO CHARGE
OPHTHALMIC | Status: DC | PRN
  Administered 2013-05-06: 2 [drp] via OPHTHALMIC

## 2013-05-06 MED ORDER — EPINEPHRINE HCL 1 MG/ML IJ SOLN
INTRAOCULAR | Status: DC | PRN
  Administered 2013-05-06: 10:00:00

## 2013-05-06 MED ORDER — LIDOCAINE HCL 3.5 % OP GEL
OPHTHALMIC | Status: AC
Start: 2013-05-06 — End: 2013-05-06
  Filled 2013-05-06: qty 5

## 2013-05-06 MED ORDER — NA HYALUR & NA CHOND-NA HYALUR 0.55-0.5 ML IO KIT
PACK | INTRAOCULAR | Status: DC | PRN
  Administered 2013-05-06: 1 via OPHTHALMIC

## 2013-05-06 MED ORDER — MOXIFLOXACIN HCL 0.5 % OP SOLN - NO CHARGE
1.0000 [drp] | Freq: Once | OPHTHALMIC | Status: DC
  Filled 2013-05-06: qty 3

## 2013-05-06 MED ORDER — BSS IO SOLN
INTRAOCULAR | Status: DC | PRN
  Administered 2013-05-06: 15 mL via INTRAOCULAR

## 2013-05-06 MED ORDER — LACTATED RINGERS IV SOLN
INTRAVENOUS | Status: DC
  Administered 2013-05-06: 09:00:00 via INTRAVENOUS

## 2013-05-06 MED ORDER — TETRACAINE HCL 0.5 % OP SOLN
OPHTHALMIC | Status: AC
  Filled 2013-05-06: qty 2

## 2013-05-06 MED ORDER — GATIFLOXACIN 0.5 % OP SOLN OPTIME - NO CHARGE
1.0000 [drp] | Freq: Once | OPHTHALMIC | Status: AC
  Administered 2013-05-06: 1 [drp] via OPHTHALMIC
  Filled 2013-05-06: qty 2.5

## 2013-05-06 MED ORDER — FENTANYL CITRATE 0.05 MG/ML IJ SOLN: 25.0000 ug | INTRAMUSCULAR | Status: DC | PRN

## 2013-05-06 MED ORDER — MIDAZOLAM HCL 2 MG/2ML IJ SOLN
1.0000 mg | INTRAMUSCULAR | Status: DC | PRN
  Administered 2013-05-06: 2 mg via INTRAVENOUS

## 2013-05-06 MED ORDER — LIDOCAINE 3.5 % OP GEL OPTIME - NO CHARGE
OPHTHALMIC | Status: DC | PRN
  Administered 2013-05-06: 2 [drp] via OPHTHALMIC

## 2013-05-06 MED ORDER — EPINEPHRINE HCL 1 MG/ML IJ SOLN
INTRAMUSCULAR | Status: AC
  Filled 2013-05-06: qty 1

## 2013-05-06 MED ORDER — ONDANSETRON HCL 4 MG/2ML IJ SOLN: 4.0000 mg | Freq: Once | INTRAMUSCULAR | Status: DC | PRN

## 2013-05-06 SURGICAL SUPPLY — 27 items
CAPSULAR TENSION RING-AMO (OPHTHALMIC RELATED) IMPLANT
CLOTH BEACON ORANGE TIMEOUT ST (SAFETY) ×2 IMPLANT
GLOVE BIO SURGEON STRL SZ7.5 (GLOVE) IMPLANT
GLOVE BIOGEL M 6.5 STRL (GLOVE) IMPLANT
GLOVE BIOGEL PI IND STRL 6.5 (GLOVE) ×1 IMPLANT
GLOVE BIOGEL PI IND STRL 7.0 (GLOVE) ×1 IMPLANT
GLOVE BIOGEL PI INDICATOR 6.5 (GLOVE) ×1
GLOVE BIOGEL PI INDICATOR 7.0 (GLOVE) ×1
GLOVE ECLIPSE 6.5 STRL STRAW (GLOVE) IMPLANT
GLOVE ECLIPSE 7.5 STRL STRAW (GLOVE) IMPLANT
GLOVE EXAM NITRILE LRG STRL (GLOVE) IMPLANT
GLOVE EXAM NITRILE MD LF STRL (GLOVE) IMPLANT
GLOVE SKINSENSE NS SZ6.5 (GLOVE)
GLOVE SKINSENSE NS SZ7.0 (GLOVE)
GLOVE SKINSENSE STRL SZ6.5 (GLOVE) IMPLANT
GLOVE SKINSENSE STRL SZ7.0 (GLOVE) IMPLANT
INST SET CATARACT ~~LOC~~ (KITS) ×2 IMPLANT
KIT VITRECTOMY (OPHTHALMIC RELATED) IMPLANT
PAD ARMBOARD 7.5X6 YLW CONV (MISCELLANEOUS) ×2 IMPLANT
PROC W NO LENS (INTRAOCULAR LENS)
PROC W SPEC LENS (INTRAOCULAR LENS)
PROCESS W NO LENS (INTRAOCULAR LENS) IMPLANT
PROCESS W SPEC LENS (INTRAOCULAR LENS) IMPLANT
RING MALYGIN (MISCELLANEOUS) IMPLANT
SIGHTPATH CAT PROC W REG LENS (Ophthalmic Related) ×2 IMPLANT
VISCOELASTIC ADDITIONAL (OPHTHALMIC RELATED) IMPLANT
WATER STERILE IRR 250ML POUR (IV SOLUTION) ×2 IMPLANT

## 2013-05-06 NOTE — Op Note (Signed)
See scanned note.

## 2013-05-06 NOTE — Brief Op Note (Signed)
05/06/2013  10:43 AM  PATIENT:  [name]  72 y.o. male  PRE-OPERATIVE DIAGNOSIS:  nuclear cataract right eye  POST-OPERATIVE DIAGNOSIS:  nuclear cataract right eye  PROCEDURE:  Procedure(s): CATARACT EXTRACTION PHACO AND INTRAOCULAR LENS PLACEMENT (IOC)  SURGEON:  Surgeon(s): Susa Simmonds, MD  ASSISTANTS:  Trenton Founds, CST   ANESTHESIA STAFF: Anesthesiologist: Laurene Footman, MD CRNA: Moshe Salisbury, CRNA  ANESTHESIA:   topical and MAC  REQUESTED LENS POWER: 21.5  LENS IMPLANT INFORMATION:   Alcon SN60WF  S/n [id number]   Exp 07/2017  CUMULATIVE DISSIPATED ENERGY:19.84  INDICATIONS:see office H&P  OP FINDINGS:moderately dense NS  COMPLICATIONS:None  DICTATION #: see scanned note  PLAN OF CARE: KPE w IOL today  PATIENT DISPOSITION:  Short Stay

## 2013-05-06 NOTE — Anesthesia Postprocedure Evaluation (Signed)
  Anesthesia Post-op Note  Patient: Casey Klein  Procedure(s) Performed: Procedure(s) with comments: CATARACT EXTRACTION PHACO AND INTRAOCULAR LENS PLACEMENT (IOC) (Right) - CDE: 19.84  Patient Location: PACU and Short Stay  Anesthesia Type:MAC  Level of Consciousness: awake, alert  and oriented  Airway and Oxygen Therapy: Patient Spontanous Breathing  Post-op Pain: none  Post-op Assessment: Post-op Vital signs reviewed, Patient's Cardiovascular Status Stable, Respiratory Function Stable, Patent Airway and No signs of Nausea or vomiting  Post-op Vital Signs: Reviewed and stable  Complications: No apparent anesthesia complications

## 2013-05-06 NOTE — Anesthesia Preprocedure Evaluation (Signed)
Anesthesia Evaluation  Patient identified by MRN, date of birth, ID band Patient awake    Reviewed: Allergy & Precautions, H&P , NPO status , Patient's Chart, lab work & pertinent test results  Airway Mallampati: II TM Distance: >3 FB Neck ROM: Full    Dental  (+) Edentulous Upper and Edentulous Lower   Pulmonary shortness of breath, asthma ,  breath sounds clear to auscultation        Cardiovascular hypertension, Pt. on medications + CAD and + Peripheral Vascular Disease + dysrhythmias Rhythm:Regular Rate:Normal     Neuro/Psych    GI/Hepatic   Endo/Other    Renal/GU      Musculoskeletal   Abdominal   Peds  Hematology   Anesthesia Other Findings   Reproductive/Obstetrics                           Anesthesia Physical Anesthesia Plan  ASA: III  Anesthesia Plan: MAC   Post-op Pain Management:    Induction: Intravenous  Airway Management Planned:   Additional Equipment:   Intra-op Plan:   Post-operative Plan:   Informed Consent: I have reviewed the patients History and Physical, chart, labs and discussed the procedure including the risks, benefits and alternatives for the proposed anesthesia with the patient or authorized representative who has indicated his/her understanding and acceptance.     Plan Discussed with:   Anesthesia Plan Comments:         Anesthesia Quick Evaluation

## 2013-05-06 NOTE — Transfer of Care (Signed)
Immediate Anesthesia Transfer of Care Note  Patient: Casey Klein  Procedure(s) Performed: Procedure(s) with comments: CATARACT EXTRACTION PHACO AND INTRAOCULAR LENS PLACEMENT (IOC) (Right) - CDE: 19.84  Patient Location: PACU and Short Stay  Anesthesia Type:MAC  Level of Consciousness: awake  Airway & Oxygen Therapy: Patient Spontanous Breathing  Post-op Assessment: Report given to PACU RN  Post vital signs: Reviewed  Complications: No apparent anesthesia complications

## 2013-05-06 NOTE — H&P (Signed)
I have reviewed the pre printed H&P, the patient was re-examined, and I have identified no significant interval changes in the patient's medical condition.  There is no change in the plan of care since the history and physical of record. 

## 2013-05-07 ENCOUNTER — Encounter (HOSPITAL_COMMUNITY): Payer: Self-pay | Admitting: Ophthalmology

## 2013-06-13 ENCOUNTER — Encounter (HOSPITAL_COMMUNITY): Payer: Self-pay | Admitting: Pharmacy Technician

## 2013-06-18 ENCOUNTER — Encounter (HOSPITAL_COMMUNITY): Payer: Self-pay

## 2013-06-18 ENCOUNTER — Encounter (HOSPITAL_COMMUNITY)
Admission: RE | Admit: 2013-06-18 | Discharge: 2013-06-18 | Disposition: A | Discharge status: Home / Self Care | Payer: Medicare Other | Source: Ambulatory Visit | Attending: Ophthalmology | Admitting: Ophthalmology

## 2013-06-18 MED ORDER — CYCLOPENTOLATE-PHENYLEPHRINE 0.2-1 % OP SOLN
OPHTHALMIC | Status: AC
  Filled 2013-06-18: qty 2

## 2013-06-18 NOTE — Patient Instructions (Addendum)
Your procedure is scheduled on: 06/24/2013  Report to [facility] at  615   AM.  Call this number if you have problems the morning of surgery: [phone]   Do not eat food or drink liquids :After Midnight.      Take these medicines the morning of surgery with A SIP OF WATER: norvasc   Do not wear jewelry, make-up or nail polish.  Do not wear lotions, powders, or perfumes. You may wear deodorant.  Do not shave 48 hours prior to surgery.  Do not bring valuables to the hospital.  Contacts, dentures or bridgework may not be worn into surgery.  Leave suitcase in the car. After surgery it may be brought to your room.  For patients admitted to the hospital, checkout time is 11:00 AM the day of discharge.   Patients discharged the day of surgery will not be allowed to drive home.  :     Please read over the following fact sheets that you were given: Coughing and Deep Breathing, Surgical Site Infection Prevention, Anesthesia Post-op Instructions and Care and Recovery After Surgery    Cataract A cataract is a clouding of the lens of the eye. When a lens becomes cloudy, vision is reduced based on the degree and nature of the clouding. Many cataracts reduce vision to some degree. Some cataracts make people more near-sighted as they develop. Other cataracts increase glare. Cataracts that are ignored and become worse can sometimes look white. The white color can be seen through the pupil. CAUSES   Aging. However, cataracts may occur at any age, even in newborns.   Certain drugs.   Trauma to the eye.   Certain diseases such as diabetes.   Specific eye diseases such as chronic inflammation inside the eye or a sudden attack of a rare form of glaucoma.   Inherited or acquired medical problems.  SYMPTOMS   Gradual, progressive drop in vision in the affected eye.   Severe, rapid visual loss. This most often happens when trauma is the cause.  DIAGNOSIS  To detect a cataract, an eye doctor  examines the lens. Cataracts are best diagnosed with an exam of the eyes with the pupils enlarged (dilated) by drops.  TREATMENT  For an early cataract, vision may improve by using different eyeglasses or stronger lighting. If that does not help your vision, surgery is the only effective treatment. A cataract needs to be surgically removed when vision loss interferes with your everyday activities, such as driving, reading, or watching TV. A cataract may also have to be removed if it prevents examination or treatment of another eye problem. Surgery removes the cloudy lens and usually replaces it with a substitute lens (intraocular lens, IOL).  At a time when both you and your doctor agree, the cataract will be surgically removed. If you have cataracts in both eyes, only one is usually removed at a time. This allows the operated eye to heal and be out of danger from any possible problems after surgery (such as infection or poor wound healing). In rare cases, a cataract may be doing damage to your eye. In these cases, your caregiver may advise surgical removal right away. The vast majority of people who have cataract surgery have better vision afterward. HOME CARE INSTRUCTIONS  If you are not planning surgery, you may be asked to do the following:  Use different eyeglasses.   Use stronger or brighter lighting.   Ask your eye doctor about reducing your medicine dose  or changing medicines if it is thought that a medicine caused your cataract. Changing medicines does not make the cataract go away on its own.   Become familiar with your surroundings. Poor vision can lead to injury. Avoid bumping into things on the affected side. You are at a higher risk for tripping or falling.   Exercise extreme care when driving or operating machinery.   Wear sunglasses if you are sensitive to bright light or experiencing problems with glare.  SEEK IMMEDIATE MEDICAL CARE IF:   You have a worsening or sudden vision  loss.   You notice redness, swelling, or increasing pain in the eye.   You have a fever.  Document Released: 11/21/2005 Document Revised: 11/10/2011 Document Reviewed: 07/15/2011 [facility] Patient Information 2012 [place].PATIENT INSTRUCTIONS POST-ANESTHESIA  IMMEDIATELY FOLLOWING SURGERY:  Do not drive or operate machinery for the first twenty four hours after surgery.  Do not make any important decisions for twenty four hours after surgery or while taking narcotic pain medications or sedatives.  If you develop intractable nausea and vomiting or a severe headache please notify your doctor immediately.  FOLLOW-UP:  Please make an appointment with your surgeon as instructed. You do not need to follow up with anesthesia unless specifically instructed to do so.  WOUND CARE INSTRUCTIONS (if applicable):  Keep a dry clean dressing on the anesthesia/puncture wound site if there is drainage.  Once the wound has quit draining you may leave it open to air.  Generally you should leave the bandage intact for twenty four hours unless there is drainage.  If the epidural site drains for more than 36-48 hours please call the anesthesia department.  QUESTIONS?:  Please feel free to call your physician or the hospital operator if you have any questions, and they will be happy to assist you.

## 2013-06-24 ENCOUNTER — Ambulatory Visit (HOSPITAL_COMMUNITY): Payer: Medicare Other | Admitting: Anesthesiology

## 2013-06-24 ENCOUNTER — Encounter (HOSPITAL_COMMUNITY)
Admission: RE | Disposition: A | Discharge status: Home / Self Care | Payer: Self-pay | Source: Ambulatory Visit | Attending: Ophthalmology

## 2013-06-24 ENCOUNTER — Ambulatory Visit (HOSPITAL_COMMUNITY)
Admission: RE | Admit: 2013-06-24 | Discharge: 2013-06-24 | Disposition: A | Discharge status: Home / Self Care | Payer: Medicare Other | Source: Ambulatory Visit | Attending: Ophthalmology | Admitting: Ophthalmology

## 2013-06-24 ENCOUNTER — Encounter (HOSPITAL_COMMUNITY): Payer: Self-pay | Admitting: Anesthesiology

## 2013-06-24 ENCOUNTER — Encounter (HOSPITAL_COMMUNITY): Payer: Self-pay | Admitting: *Deleted

## 2013-06-24 DIAGNOSIS — Z79899 Other long term (current) drug therapy: Secondary | ICD-10-CM | POA: Insufficient documentation

## 2013-06-24 DIAGNOSIS — H2510 Age-related nuclear cataract, unspecified eye: Secondary | ICD-10-CM | POA: Insufficient documentation

## 2013-06-24 DIAGNOSIS — I10 Essential (primary) hypertension: Secondary | ICD-10-CM | POA: Insufficient documentation

## 2013-06-24 HISTORY — PX: CATARACT EXTRACTION W/PHACO: SHX586

## 2013-06-24 SURGERY — PHACOEMULSIFICATION, CATARACT, WITH IOL INSERTION
Anesthesia: Monitor Anesthesia Care | Site: Eye | Laterality: Left | Wound class: Clean

## 2013-06-24 MED ORDER — TETRACAINE HCL 0.5 % OP SOLN
OPHTHALMIC | Status: AC
  Filled 2013-06-24: qty 2

## 2013-06-24 MED ORDER — CYCLOPENTOLATE-PHENYLEPHRINE 0.2-1 % OP SOLN
1.0000 [drp] | OPHTHALMIC | Status: AC
  Administered 2013-06-24 (×3): 1 [drp] via OPHTHALMIC

## 2013-06-24 MED ORDER — MIDAZOLAM HCL 2 MG/2ML IJ SOLN
1.0000 mg | INTRAMUSCULAR | Status: DC | PRN
  Administered 2013-06-24: 2 mg via INTRAVENOUS

## 2013-06-24 MED ORDER — TETRACAINE 0.5 % OP SOLN OPTIME - NO CHARGE
OPHTHALMIC | Status: DC | PRN
  Administered 2013-06-24: 2 [drp] via OPHTHALMIC

## 2013-06-24 MED ORDER — EPINEPHRINE HCL 1 MG/ML IJ SOLN
INTRAMUSCULAR | Status: AC
  Filled 2013-06-24: qty 1

## 2013-06-24 MED ORDER — MIDAZOLAM HCL 2 MG/2ML IJ SOLN
INTRAMUSCULAR | Status: AC
  Filled 2013-06-24: qty 2

## 2013-06-24 MED ORDER — LIDOCAINE HCL 3.5 % OP GEL
OPHTHALMIC | Status: AC
  Filled 2013-06-24: qty 5

## 2013-06-24 MED ORDER — NA HYALUR & NA CHOND-NA HYALUR 0.55-0.5 ML IO KIT
PACK | INTRAOCULAR | Status: DC | PRN
  Administered 2013-06-24: 1 via OPHTHALMIC

## 2013-06-24 MED ORDER — GATIFLOXACIN 0.5 % OP SOLN OPTIME - NO CHARGE
1.0000 [drp] | Freq: Once | OPHTHALMIC | Status: AC
  Administered 2013-06-24: 1 [drp] via OPHTHALMIC
  Filled 2013-06-24: qty 2.5

## 2013-06-24 MED ORDER — POVIDONE-IODINE 5 % OP SOLN
OPHTHALMIC | Status: DC | PRN
  Administered 2013-06-24: 1 via OPHTHALMIC

## 2013-06-24 MED ORDER — MOXIFLOXACIN HCL 0.5 % OP SOLN - NO CHARGE
1.0000 [drp] | Freq: Once | OPHTHALMIC | Status: DC
  Filled 2013-06-24: qty 3

## 2013-06-24 MED ORDER — LIDOCAINE 3.5 % OP GEL OPTIME - NO CHARGE
OPHTHALMIC | Status: DC | PRN
  Administered 2013-06-24: 2 [drp] via OPHTHALMIC

## 2013-06-24 MED ORDER — LIDOCAINE HCL (PF) 1 % IJ SOLN
INTRAMUSCULAR | Status: AC
  Filled 2013-06-24: qty 2

## 2013-06-24 MED ORDER — GATIFLOXACIN 0.5 % OP SOLN OPTIME - NO CHARGE
OPHTHALMIC | Status: DC | PRN
  Administered 2013-06-24: 1 [drp] via OPHTHALMIC

## 2013-06-24 MED ORDER — EPINEPHRINE HCL 1 MG/ML IJ SOLN
INTRAOCULAR | Status: DC | PRN
  Administered 2013-06-24: 08:00:00

## 2013-06-24 MED ORDER — CARBACHOL 0.01 % IO SOLN
INTRAOCULAR | Status: AC
  Filled 2013-06-24: qty 1.5

## 2013-06-24 MED ORDER — BSS IO SOLN
INTRAOCULAR | Status: DC | PRN
  Administered 2013-06-24: 15 mL via INTRAOCULAR

## 2013-06-24 MED ORDER — LACTATED RINGERS IV SOLN
INTRAVENOUS | Status: DC
  Administered 2013-06-24: 1000 mL via INTRAVENOUS

## 2013-06-24 MED ORDER — KETOROLAC TROMETHAMINE 0.4 % OP SOLN - NO CHARGE
1.0000 [drp] | Freq: Once | OPHTHALMIC | Status: AC
  Administered 2013-06-24: 1 [drp] via OPHTHALMIC
  Filled 2013-06-24: qty 5

## 2013-06-24 SURGICAL SUPPLY — 27 items
CAPSULAR TENSION RING-AMO (OPHTHALMIC RELATED) IMPLANT
CLOTH BEACON ORANGE TIMEOUT ST (SAFETY) ×2 IMPLANT
GLOVE BIO SURGEON STRL SZ7.5 (GLOVE) IMPLANT
GLOVE BIOGEL M 6.5 STRL (GLOVE) IMPLANT
GLOVE BIOGEL PI IND STRL 6.5 (GLOVE) ×1 IMPLANT
GLOVE BIOGEL PI IND STRL 7.0 (GLOVE) IMPLANT
GLOVE BIOGEL PI INDICATOR 6.5 (GLOVE) ×1
GLOVE BIOGEL PI INDICATOR 7.0 (GLOVE)
GLOVE ECLIPSE 6.5 STRL STRAW (GLOVE) IMPLANT
GLOVE ECLIPSE 7.5 STRL STRAW (GLOVE) IMPLANT
GLOVE EXAM NITRILE LRG STRL (GLOVE) IMPLANT
GLOVE EXAM NITRILE MD LF STRL (GLOVE) ×2 IMPLANT
GLOVE SKINSENSE NS SZ6.5 (GLOVE)
GLOVE SKINSENSE NS SZ7.0 (GLOVE)
GLOVE SKINSENSE STRL SZ6.5 (GLOVE) IMPLANT
GLOVE SKINSENSE STRL SZ7.0 (GLOVE) IMPLANT
INST SET CATARACT ~~LOC~~ (KITS) ×2 IMPLANT
KIT VITRECTOMY (OPHTHALMIC RELATED) IMPLANT
PAD ARMBOARD 7.5X6 YLW CONV (MISCELLANEOUS) ×2 IMPLANT
PROC W NO LENS (INTRAOCULAR LENS)
PROC W SPEC LENS (INTRAOCULAR LENS)
PROCESS W NO LENS (INTRAOCULAR LENS) IMPLANT
PROCESS W SPEC LENS (INTRAOCULAR LENS) IMPLANT
RING MALYGIN (MISCELLANEOUS) IMPLANT
SIGHTPATH CAT PROC W REG LENS (Ophthalmic Related) ×2 IMPLANT
VISCOELASTIC ADDITIONAL (OPHTHALMIC RELATED) IMPLANT
WATER STERILE IRR 250ML POUR (IV SOLUTION) ×2 IMPLANT

## 2013-06-24 NOTE — Brief Op Note (Signed)
06/24/2013  8:40 AM  PATIENT:  Casey Klein  72 y.o. male  PRE-OPERATIVE DIAGNOSIS:  nuclear cataract left eye  POST-OPERATIVE DIAGNOSIS:  nuclear cataract left eye  PROCEDURE:  Procedure(s): CATARACT EXTRACTION PHACO AND INTRAOCULAR LENS PLACEMENT (IOC)  SURGEON:  Surgeon(s): Susa Simmonds, MD  ASSISTANTS: staff    ANESTHESIA STAFF: Anesthesiologist: Laurene Footman, MD CRNA: Franco Nones, CRNA  ANESTHESIA:   topical and MAC  REQUESTED LENS POWER: 23.  LENS IMPLANT INFORMATION: @ORIMPLANT @  CUMULATIVE DISSIPATED ENERGY:18.21  INDICATIONS:visual deficit interfering with performance of tasks specified in H&P  OP FINDINGS:moderate ns  COMPLICATIONS:None  DICTATION #: see scanned OP note  PLAN OF CARE: as scheduled  PATIENT DISPOSITION:  Short Stay

## 2013-06-24 NOTE — H&P (Signed)
Computer not available pre op.  Update done on paper.

## 2013-06-24 NOTE — Anesthesia Preprocedure Evaluation (Signed)
Anesthesia Evaluation  Patient identified by MRN, date of birth, ID band Patient awake    Reviewed: Allergy & Precautions, H&P , NPO status , Patient's Chart, lab work & pertinent test results  Airway Mallampati: II TM Distance: >3 FB Neck ROM: Full    Dental  (+) Edentulous Upper and Edentulous Lower   Pulmonary shortness of breath, asthma ,  breath sounds clear to auscultation        Cardiovascular hypertension, Pt. on medications + CAD and + Peripheral Vascular Disease + dysrhythmias Rhythm:Regular Rate:Normal     Neuro/Psych    GI/Hepatic   Endo/Other    Renal/GU      Musculoskeletal   Abdominal   Peds  Hematology   Anesthesia Other Findings   Reproductive/Obstetrics                           Anesthesia Physical Anesthesia Plan  ASA: III  Anesthesia Plan: MAC   Post-op Pain Management:    Induction: Intravenous  Airway Management Planned:   Additional Equipment:   Intra-op Plan:   Post-operative Plan:   Informed Consent: I have reviewed the patients History and Physical, chart, labs and discussed the procedure including the risks, benefits and alternatives for the proposed anesthesia with the patient or authorized representative who has indicated his/her understanding and acceptance.     Plan Discussed with:   Anesthesia Plan Comments:         Anesthesia Quick Evaluation

## 2013-06-24 NOTE — Anesthesia Postprocedure Evaluation (Signed)
  Anesthesia Post-op Note  Patient: Casey Klein  Procedure(s) Performed: Procedure(s) (LRB): CATARACT EXTRACTION PHACO AND INTRAOCULAR LENS PLACEMENT (IOC) (Left)  Patient Location:  Short Stay  Anesthesia Type: MAC  Level of Consciousness: awake  Airway and Oxygen Therapy: Patient Spontanous Breathing  Post-op Pain: none  Post-op Assessment: Post-op Vital signs reviewed, Patient's Cardiovascular Status Stable, Respiratory Function Stable, Patent Airway, No signs of Nausea or vomiting and Pain level controlled  Post-op Vital Signs: Reviewed and stable  Complications: No apparent anesthesia complications

## 2013-06-24 NOTE — Op Note (Signed)
See scanned op note done today 

## 2013-06-24 NOTE — Brief Op Note (Signed)
06/24/2013  10:47 AM  PATIENT:  [name]  72 y.o. male  PRE-OPERATIVE DIAGNOSIS:  nuclear cataract left eye  POST-OPERATIVE DIAGNOSIS:  nuclear cataract left eye  PROCEDURE:  Procedure(s): CATARACT EXTRACTION PHACO AND INTRAOCULAR LENS PLACEMENT (IOC)  SURGEON:  Surgeon(s): Susa Simmonds, MD  ASSISTANTS:  staff  ANESTHESIA STAFF: Anesthesiologist: Laurene Footman, MD CRNA: Franco Nones, CRNA  ANESTHESIA:   topical and MAC  REQUESTED LENS POWER: 23.5  LENS IMPLANT INFORMATION: Alcon SN60WF  S/n [id number]  Exp 02/2017  CUMULATIVE DISSIPATED ENERGY:18.21  INDICATIONS:  See pre op H&P  OP FINDINGS:  moderately dense NS  COMPLICATIONS:None  DICTATION #: see scanned op note  PLAN OF CARE: as above  PATIENT DISPOSITION:  Short Stay

## 2013-06-24 NOTE — Transfer of Care (Signed)
Immediate Anesthesia Transfer of Care Note  Patient: Casey Klein  Procedure(s) Performed: Procedure(s) (LRB): CATARACT EXTRACTION PHACO AND INTRAOCULAR LENS PLACEMENT (IOC) (Left)  Patient Location: Shortstay  Anesthesia Type: MAC  Level of Consciousness: awake  Airway & Oxygen Therapy: Patient Spontanous Breathing   Post-op Assessment: Report given to PACU RN, Post -op Vital signs reviewed and stable and Patient moving all extremities  Post vital signs: Reviewed and stable  Complications: No apparent anesthesia complications

## 2013-06-25 ENCOUNTER — Encounter (HOSPITAL_COMMUNITY): Payer: Self-pay | Admitting: Ophthalmology

## 2013-07-25 ENCOUNTER — Other Ambulatory Visit: Payer: Self-pay

## 2013-10-21 ENCOUNTER — Ambulatory Visit (INDEPENDENT_AMBULATORY_CARE_PROVIDER_SITE_OTHER): Payer: Medicare Other | Admitting: Family Medicine

## 2013-10-21 ENCOUNTER — Encounter: Payer: Self-pay | Admitting: Family Medicine

## 2013-10-21 VITALS — BP 163/77 | HR 68 | Temp 97.8°F | Ht 69.0 in | Wt 194.4 lb

## 2013-10-21 DIAGNOSIS — R5381 Other malaise: Secondary | ICD-10-CM

## 2013-10-21 DIAGNOSIS — Z0000 Encounter for general adult medical examination without abnormal findings: Secondary | ICD-10-CM

## 2013-10-21 LAB — POCT CBC
Granulocyte percent: 70.8 %G (ref 37–80)
HCT, POC: 46.3 % (ref 43.5–53.7)
Hemoglobin: 14.6 g/dL (ref 14.1–18.1)
Lymph, poc: 2.4 (ref 0.6–3.4)
MCH, POC: 28.4 pg (ref 27–31.2)
MCHC: 31.6 g/dL — AB (ref 31.8–35.4)
MCV: 89.9 fL (ref 80–97)
MPV: 7.6 fL (ref 0–99.8)
POC Granulocyte: 6.8 (ref 2–6.9)
POC LYMPH PERCENT: 25.3 %L (ref 10–50)
Platelet Count, POC: 298 10*3/uL (ref 142–424)
RBC: 5.2 M/uL (ref 4.69–6.13)
RDW, POC: 15.8 %
WBC: 9.6 10*3/uL (ref 4.6–10.2)

## 2013-10-21 NOTE — Progress Notes (Signed)
  Subjective:    Patient ID: Casey Klein, male    DOB: [birth year], 72 y.o.   MRN: [id number]  HPI  This 72 y.o. male presents for evaluation of routine follow up.  He has Hx of hypertension, CAD, and asthma.  He denies any acute medical Problems.  He sees dr. Lanier Clam cardiology.  Review of Systems    No chest pain, SOB, HA, dizziness, vision change, N/V, diarrhea, constipation, dysuria, urinary urgency or frequency, myalgias, arthralgias or rash.  Objective:   Physical Exam Vital signs noted  Well developed well nourished male.  HEENT - Head atraumatic Normocephalic                Eyes - PERRLA, Conjuctiva - clear Sclera- Clear EOMI                Ears - EAC's Wnl TM's Wnl Gross Hearing WNL                Nose - Nares patent                 Throat - oropharanx wnl Respiratory - Lungs CTA bilateral Cardiac - RRR S1 and S2 without murmur GI - Abdomen soft Nontender and bowel sounds active x 4 Extremities - No edema. Neuro - Grossly intact.       Assessment & Plan:  Routine general medical examination at a health care facility - Plan: POCT CBC, CMP14+EGFR, Lipid panel, Thyroid Panel With TSH, Vitamin B12, Vit D  25 hydroxy (rtn osteoporosis monitoring) fobt x 3 cards given.  Recommend getting eye exam from eye doctor.  Other malaise and fatigue - Plan: POCT CBC, CMP14+EGFR, Lipid panel, Thyroid Panel With TSH, Vitamin B12, Vit D  25 hydroxy (rtn osteoporosis monitoring).  Deatra Canter FNP

## 2013-10-21 NOTE — Patient Instructions (Signed)

## 2013-10-22 ENCOUNTER — Ambulatory Visit (INDEPENDENT_AMBULATORY_CARE_PROVIDER_SITE_OTHER): Payer: Medicare Other

## 2013-10-22 ENCOUNTER — Other Ambulatory Visit: Payer: Self-pay | Admitting: Family Medicine

## 2013-10-22 DIAGNOSIS — E538 Deficiency of other specified B group vitamins: Secondary | ICD-10-CM

## 2013-10-22 DIAGNOSIS — E559 Vitamin D deficiency, unspecified: Secondary | ICD-10-CM

## 2013-10-22 LAB — CMP14+EGFR
ALT: 39 IU/L (ref 0–44)
AST: 35 IU/L (ref 0–40)
Albumin/Globulin Ratio: 1.7 (ref 1.1–2.5)
Albumin: 4.5 g/dL (ref 3.5–4.8)
Alkaline Phosphatase: 62 IU/L (ref 39–117)
BUN/Creatinine Ratio: 13 (ref 10–22)
BUN: 13 mg/dL (ref 8–27)
CO2: 24 mmol/L (ref 18–29)
Calcium: 9.2 mg/dL (ref 8.6–10.2)
Chloride: 104 mmol/L (ref 97–108)
Creatinine, Ser: 1 mg/dL (ref 0.76–1.27)
GFR calc Af Amer: 86 mL/min/{1.73_m2} (ref >59)
GFR calc non Af Amer: 74 mL/min/{1.73_m2} (ref >59)
Globulin, Total: 2.6 g/dL (ref 1.5–4.5)
Glucose: 92 mg/dL (ref 65–99)
Potassium: 4.5 mmol/L (ref 3.5–5.2)
Sodium: 142 mmol/L (ref 134–144)
Total Bilirubin: 0.5 mg/dL (ref 0.0–1.2)
Total Protein: 7.1 g/dL (ref 6.0–8.5)

## 2013-10-22 LAB — THYROID PANEL WITH TSH
Free Thyroxine Index: 1.7 (ref 1.2–4.9)
T3 Uptake Ratio: 28 % (ref 24–39)
T4, Total: 5.9 ug/dL (ref 4.5–12.0)
TSH: 1.5 u[IU]/mL (ref 0.450–4.500)

## 2013-10-22 LAB — LIPID PANEL
Chol/HDL Ratio: 4.7 ratio units (ref 0.0–5.0)
Cholesterol, Total: 187 mg/dL (ref 100–199)
HDL: 40 mg/dL (ref >39)
LDL Calculated: 109 mg/dL — ABNORMAL HIGH (ref 0–99)
Triglycerides: 188 mg/dL — ABNORMAL HIGH (ref 0–149)
VLDL Cholesterol Cal: 38 mg/dL (ref 5–40)

## 2013-10-22 LAB — VITAMIN D 25 HYDROXY (VIT D DEFICIENCY, FRACTURES): Vit D, 25-Hydroxy: 15.4 ng/mL — ABNORMAL LOW (ref 30.0–100.0)

## 2013-10-22 LAB — VITAMIN B12: Vitamin B-12: 183 pg/mL — ABNORMAL LOW (ref 211–946)

## 2013-10-22 MED ORDER — VITAMIN D (ERGOCALCIFEROL) 1.25 MG (50000 UNIT) PO CAPS: 50000.0000 [IU] | ORAL_CAPSULE | ORAL | Status: DC

## 2013-10-22 MED ORDER — CYANOCOBALAMIN 1000 MCG/ML IJ SOLN
1000.0000 ug | INTRAMUSCULAR | Status: DC
  Administered 2013-10-22: 1000 ug via INTRAMUSCULAR

## 2013-10-22 MED ORDER — CYANOCOBALAMIN 1000 MCG/ML IJ SOLN: INTRAMUSCULAR | Status: DC

## 2013-10-29 ENCOUNTER — Ambulatory Visit (INDEPENDENT_AMBULATORY_CARE_PROVIDER_SITE_OTHER): Payer: Medicare Other | Admitting: *Deleted

## 2013-10-29 DIAGNOSIS — E538 Deficiency of other specified B group vitamins: Secondary | ICD-10-CM

## 2013-10-29 MED ORDER — CYANOCOBALAMIN 1000 MCG/ML IJ SOLN
1000.0000 ug | Freq: Once | INTRAMUSCULAR | Status: AC
  Administered 2013-10-29: 1000 ug via INTRAMUSCULAR

## 2013-10-30 ENCOUNTER — Ambulatory Visit (INDEPENDENT_AMBULATORY_CARE_PROVIDER_SITE_OTHER): Payer: Medicare Other | Admitting: *Deleted

## 2013-10-30 DIAGNOSIS — E538 Deficiency of other specified B group vitamins: Secondary | ICD-10-CM

## 2013-10-30 MED ORDER — CYANOCOBALAMIN 1000 MCG/ML IJ SOLN
1000.0000 ug | INTRAMUSCULAR | Status: DC
  Administered 2013-10-30: 1000 ug via INTRAMUSCULAR

## 2013-10-30 NOTE — Patient Instructions (Signed)
Vitamin B12 Injections Every person needs vitamin B12. A deficiency develops when the body does not get enough of it. One way to overcome this is by getting B12 shots (injections). A B12 shot puts the vitamin directly into muscle tissue. This avoids any problems your body might have in absorbing it from food or a pill. In some people, the body has trouble using the vitamin correctly. This can cause a B12 deficiency. Not consuming enough of the vitamin can also cause a deficiency. Getting enough vitamin B12 can be hard for elderly people. Sometimes, they do not eat a well-balanced diet. The elderly are also more likely than younger people to have medical conditions or take medications that can lead to a deficiency. WHAT DOES VITAMIN B12 DO? Vitamin B12 does many things to help the body work right:  It helps the body make healthy red blood cells.  It helps maintain nerve cells.  It is involved in the body's process of converting food into energy (metabolism).  It is needed to make the genetic material in all cells (DNA). VITAMIN B12 FOOD SOURCES Most people get plenty of vitamin B12 through the foods they eat. It is present in:  Meat, fish, poultry, and eggs.  Milk and milk products.  It also is added when certain foods are made, including some breads, cereals and yogurts. The food is then called "fortified". CAUSES The most common causes of vitamin B12 deficiency are:  Pernicious anemia. The condition develops when the body cannot make enough healthy red blood cells. This stems from a lack of a protein made in the stomach (intrinsic factor). People without this protein cannot absorb enough vitamin B12 from food.  Malabsorption. This is when the body cannot absorb the vitamin. It can be caused by:  Pernicious anemia.  Surgery to remove part or all of the stomach can lead to malabsorption. Removal of part or all of the small intestine can also cause malabsorption.  Vegetarian diet.  People who are strict about not eating foods from animals could have trouble taking in enough vitamin B12 from diet alone.  Medications. Some medicines have been linked to B12 deficiency, such as Metformin (a drug prescribed for type 2 diabetes). Long-term use of stomach acid suppressants also can keep the vitamin from being absorbed.  Intestinal problems such as inflammatory bowel disease. If there are problems in the digestive tract, vitamin B12 may not be absorbed in good enough amounts. SYMPTOMS People who do not get enough B12 can develop problems. These can include:  Anemia. This is when the body has too few red blood cells. Red blood cells carry oxygen to the rest of the body. Without a healthy supply of red blood cells, people can feel:  Tired (fatigued).  Weak.  Severe anemia can cause:  Shortness of breath.  Dizziness.  Rapid heart rate.  Paleness.  Other Vitamin B12 deficiency symptoms include:  Diarrhea.  Numbness or tingling in the hands or feet.  Loss of appetite.  Confusion.  Sores on the tongue or in the mouth. LET YOUR CAREGIVER KNOW ABOUT:  Any allergies. It is very important to know if you are allergic or sensitive to cobalt. Vitamin B12 contains cobalt.  Any history of kidney disease.  All medications you are taking. Include prescription and over-the-counter medicines, herbs and creams.  Whether you are pregnant or breast-feeding.  If you have Leber's disease, a hereditary eye condition, vitamin B12 could make it worse. RISKS AND COMPLICATIONS Reactions to an injection are   usually temporary. They might include:  Pain at the injection site.  Redness, swelling or tenderness at the site.  Headache, dizziness or weakness.  Nausea, upset stomach or diarrhea.  Numbness or tingling.  Fever.  Joint pain.  Itching or rash. If a reaction does not go away in a short while, talk with your healthcare provider. A change in the way the shots are  given, or where they are given, might need to be made. BEFORE AN INJECTION To decide whether B12 injections are right for you, your healthcare provider will probably:  Ask about your medical history.  Ask questions about your diet.  Ask about symptoms such as:  Have you felt weak?  Do you feel unusually tired?  Do you get dizzy?  Order blood tests. These may include a test to:  Check the level of red cells in your blood.  Measure B12 levels.  Check for the presence of intrinsic factor. VITAMIN B12 INJECTIONS How often you will need a vitamin B12 injection will depend on how severe your deficiency is. This also will affect how long you will need to get them. People with pernicious anemia usually get injections for their entire life. Others might get them for a shorter period. For many people, injections are given daily or weekly for several weeks. Then, once B12 levels are normal, injections are given just once a month. If the cause of the deficiency can be fixed, the injections can be stopped. Talk with your healthcare provider about what you should expect. For an injection:  The injection site will be cleaned with an alcohol swab.  Your healthcare provider will insert a needle directly into a muscle. Most any muscle can be used. Most often, an arm muscle is used. A buttocks muscle can also be used. Many people say shots in that area are less painful.  A small adhesive bandage may be put over the injection site. It usually can be taken off in an hour or less. Injections can be given by your healthcare provider. In some cases, family members give them. Sometimes, people give them to themselves. Talk with your healthcare provider about what would be best for you. If someone other than your healthcare provider will be giving the shots, the person will need to be trained to give them correctly. HOME CARE INSTRUCTIONS   You can remove the adhesive bandage within an hour of getting a  shot.  You should be able to go about your normal activities right away.  Avoid drinking large amounts of alcohol while taking vitamin B12 shots. Alcohol can interfere with the body's use of the vitamin. SEEK MEDICAL CARE IF:   Pain, redness, swelling or tenderness at the injection site does not get better or gets worse.  Headache, dizziness or weakness does not go away.  You develop a fever of more than 100.5 F (38.1 C). SEEK IMMEDIATE MEDICAL CARE IF:   You have chest pain.  You develop shortness of breath.  You have muscle weakness that gets worse.  You develop numbness, weakness or tingling on one side or one area of the body.  You have symptoms of an allergic reaction, such as:  Hives.  Difficulty breathing.  Swelling of the lips, face, tongue or throat.  You develop a fever of more than 102.0 F (38.9 C). MAKE SURE YOU:   Understand these instructions.  Will watch your condition.  Will get help right away if you are not doing well or get worse. Document   Released: 02/17/2009 Document Revised: 02/13/2012 Document Reviewed: 02/17/2009 [employer] Patient Information 2014 [employer].  

## 2013-10-30 NOTE — Progress Notes (Signed)
Vitamin b12 injection given and tolerated well.  

## 2013-11-01 ENCOUNTER — Ambulatory Visit (INDEPENDENT_AMBULATORY_CARE_PROVIDER_SITE_OTHER): Payer: Medicare Other | Admitting: *Deleted

## 2013-11-01 DIAGNOSIS — E538 Deficiency of other specified B group vitamins: Secondary | ICD-10-CM

## 2013-11-01 MED ORDER — CYANOCOBALAMIN 1000 MCG/ML IJ SOLN
1000.0000 ug | Freq: Every day | INTRAMUSCULAR | Status: AC
  Administered 2013-11-01: 1000 ug via INTRAMUSCULAR

## 2013-11-01 NOTE — Progress Notes (Signed)
Vitamin b12 injection given and tolerated well.  

## 2013-11-01 NOTE — Patient Instructions (Signed)
Vitamin B12 Injections Every person needs vitamin B12. A deficiency develops when the body does not get enough of it. One way to overcome this is by getting B12 shots (injections). A B12 shot puts the vitamin directly into muscle tissue. This avoids any problems your body might have in absorbing it from food or a pill. In some people, the body has trouble using the vitamin correctly. This can cause a B12 deficiency. Not consuming enough of the vitamin can also cause a deficiency. Getting enough vitamin B12 can be hard for elderly people. Sometimes, they do not eat a well-balanced diet. The elderly are also more likely than younger people to have medical conditions or take medications that can lead to a deficiency. WHAT DOES VITAMIN B12 DO? Vitamin B12 does many things to help the body work right:  It helps the body make healthy red blood cells.  It helps maintain nerve cells.  It is involved in the body's process of converting food into energy (metabolism).  It is needed to make the genetic material in all cells (DNA). VITAMIN B12 FOOD SOURCES Most people get plenty of vitamin B12 through the foods they eat. It is present in:  Meat, fish, poultry, and eggs.  Milk and milk products.  It also is added when certain foods are made, including some breads, cereals and yogurts. The food is then called "fortified". CAUSES The most common causes of vitamin B12 deficiency are:  Pernicious anemia. The condition develops when the body cannot make enough healthy red blood cells. This stems from a lack of a protein made in the stomach (intrinsic factor). People without this protein cannot absorb enough vitamin B12 from food.  Malabsorption. This is when the body cannot absorb the vitamin. It can be caused by:  Pernicious anemia.  Surgery to remove part or all of the stomach can lead to malabsorption. Removal of part or all of the small intestine can also cause malabsorption.  Vegetarian diet.  People who are strict about not eating foods from animals could have trouble taking in enough vitamin B12 from diet alone.  Medications. Some medicines have been linked to B12 deficiency, such as Metformin (a drug prescribed for type 2 diabetes). Long-term use of stomach acid suppressants also can keep the vitamin from being absorbed.  Intestinal problems such as inflammatory bowel disease. If there are problems in the digestive tract, vitamin B12 may not be absorbed in good enough amounts. SYMPTOMS People who do not get enough B12 can develop problems. These can include:  Anemia. This is when the body has too few red blood cells. Red blood cells carry oxygen to the rest of the body. Without a healthy supply of red blood cells, people can feel:  Tired (fatigued).  Weak.  Severe anemia can cause:  Shortness of breath.  Dizziness.  Rapid heart rate.  Paleness.  Other Vitamin B12 deficiency symptoms include:  Diarrhea.  Numbness or tingling in the hands or feet.  Loss of appetite.  Confusion.  Sores on the tongue or in the mouth. LET YOUR CAREGIVER KNOW ABOUT:  Any allergies. It is very important to know if you are allergic or sensitive to cobalt. Vitamin B12 contains cobalt.  Any history of kidney disease.  All medications you are taking. Include prescription and over-the-counter medicines, herbs and creams.  Whether you are pregnant or breast-feeding.  If you have Leber's disease, a hereditary eye condition, vitamin B12 could make it worse. RISKS AND COMPLICATIONS Reactions to an injection are   usually temporary. They might include:  Pain at the injection site.  Redness, swelling or tenderness at the site.  Headache, dizziness or weakness.  Nausea, upset stomach or diarrhea.  Numbness or tingling.  Fever.  Joint pain.  Itching or rash. If a reaction does not go away in a short while, talk with your healthcare provider. A change in the way the shots are  given, or where they are given, might need to be made. BEFORE AN INJECTION To decide whether B12 injections are right for you, your healthcare provider will probably:  Ask about your medical history.  Ask questions about your diet.  Ask about symptoms such as:  Have you felt weak?  Do you feel unusually tired?  Do you get dizzy?  Order blood tests. These may include a test to:  Check the level of red cells in your blood.  Measure B12 levels.  Check for the presence of intrinsic factor. VITAMIN B12 INJECTIONS How often you will need a vitamin B12 injection will depend on how severe your deficiency is. This also will affect how long you will need to get them. People with pernicious anemia usually get injections for their entire life. Others might get them for a shorter period. For many people, injections are given daily or weekly for several weeks. Then, once B12 levels are normal, injections are given just once a month. If the cause of the deficiency can be fixed, the injections can be stopped. Talk with your healthcare provider about what you should expect. For an injection:  The injection site will be cleaned with an alcohol swab.  Your healthcare provider will insert a needle directly into a muscle. Most any muscle can be used. Most often, an arm muscle is used. A buttocks muscle can also be used. Many people say shots in that area are less painful.  A small adhesive bandage may be put over the injection site. It usually can be taken off in an hour or less. Injections can be given by your healthcare provider. In some cases, family members give them. Sometimes, people give them to themselves. Talk with your healthcare provider about what would be best for you. If someone other than your healthcare provider will be giving the shots, the person will need to be trained to give them correctly. HOME CARE INSTRUCTIONS   You can remove the adhesive bandage within an hour of getting a  shot.  You should be able to go about your normal activities right away.  Avoid drinking large amounts of alcohol while taking vitamin B12 shots. Alcohol can interfere with the body's use of the vitamin. SEEK MEDICAL CARE IF:   Pain, redness, swelling or tenderness at the injection site does not get better or gets worse.  Headache, dizziness or weakness does not go away.  You develop a fever of more than 100.5 F (38.1 C). SEEK IMMEDIATE MEDICAL CARE IF:   You have chest pain.  You develop shortness of breath.  You have muscle weakness that gets worse.  You develop numbness, weakness or tingling on one side or one area of the body.  You have symptoms of an allergic reaction, such as:  Hives.  Difficulty breathing.  Swelling of the lips, face, tongue or throat.  You develop a fever of more than 102.0 F (38.9 C). MAKE SURE YOU:   Understand these instructions.  Will watch your condition.  Will get help right away if you are not doing well or get worse. Document   Released: 02/17/2009 Document Revised: 02/13/2012 Document Reviewed: 02/17/2009 [employer] Patient Information 2014 [employer].  

## 2013-11-04 ENCOUNTER — Ambulatory Visit (INDEPENDENT_AMBULATORY_CARE_PROVIDER_SITE_OTHER): Payer: Medicare Other | Admitting: *Deleted

## 2013-11-04 DIAGNOSIS — E538 Deficiency of other specified B group vitamins: Secondary | ICD-10-CM

## 2013-11-04 MED ORDER — CYANOCOBALAMIN 1000 MCG/ML IJ SOLN
1000.0000 ug | INTRAMUSCULAR | Status: DC
  Administered 2013-11-04 – 2014-03-10 (×5): 1000 ug via INTRAMUSCULAR

## 2013-12-06 ENCOUNTER — Ambulatory Visit (INDEPENDENT_AMBULATORY_CARE_PROVIDER_SITE_OTHER): Payer: Medicare HMO | Admitting: *Deleted

## 2013-12-06 DIAGNOSIS — E538 Deficiency of other specified B group vitamins: Secondary | ICD-10-CM

## 2013-12-06 NOTE — Patient Instructions (Signed)
Vitamin B12 Injections Every person needs vitamin B12. A deficiency develops when the body does not get enough of it. One way to overcome this is by getting B12 shots (injections). A B12 shot puts the vitamin directly into muscle tissue. This avoids any problems your body might have in absorbing it from food or a pill. In some people, the body has trouble using the vitamin correctly. This can cause a B12 deficiency. Not consuming enough of the vitamin can also cause a deficiency. Getting enough vitamin B12 can be hard for elderly people. Sometimes, they do not eat a well-balanced diet. The elderly are also more likely than younger people to have medical conditions or take medications that can lead to a deficiency. WHAT DOES VITAMIN B12 DO? Vitamin B12 does many things to help the body work right:  It helps the body make healthy red blood cells.  It helps maintain nerve cells.  It is involved in the body's process of converting food into energy (metabolism).  It is needed to make the genetic material in all cells (DNA). VITAMIN B12 FOOD SOURCES Most people get plenty of vitamin B12 through the foods they eat. It is present in:  Meat, fish, poultry, and eggs.  Milk and milk products.  It also is added when certain foods are made, including some breads, cereals and yogurts. The food is then called "fortified". CAUSES The most common causes of vitamin B12 deficiency are:  Pernicious anemia. The condition develops when the body cannot make enough healthy red blood cells. This stems from a lack of a protein made in the stomach (intrinsic factor). People without this protein cannot absorb enough vitamin B12 from food.  Malabsorption. This is when the body cannot absorb the vitamin. It can be caused by:  Pernicious anemia.  Surgery to remove part or all of the stomach can lead to malabsorption. Removal of part or all of the small intestine can also cause malabsorption.  Vegetarian diet.  People who are strict about not eating foods from animals could have trouble taking in enough vitamin B12 from diet alone.  Medications. Some medicines have been linked to B12 deficiency, such as Metformin (a drug prescribed for type 2 diabetes). Long-term use of stomach acid suppressants also can keep the vitamin from being absorbed.  Intestinal problems such as inflammatory bowel disease. If there are problems in the digestive tract, vitamin B12 may not be absorbed in good enough amounts. SYMPTOMS People who do not get enough B12 can develop problems. These can include:  Anemia. This is when the body has too few red blood cells. Red blood cells carry oxygen to the rest of the body. Without a healthy supply of red blood cells, people can feel:  Tired (fatigued).  Weak.  Severe anemia can cause:  Shortness of breath.  Dizziness.  Rapid heart rate.  Paleness.  Other Vitamin B12 deficiency symptoms include:  Diarrhea.  Numbness or tingling in the hands or feet.  Loss of appetite.  Confusion.  Sores on the tongue or in the mouth. LET YOUR CAREGIVER KNOW ABOUT:  Any allergies. It is very important to know if you are allergic or sensitive to cobalt. Vitamin B12 contains cobalt.  Any history of kidney disease.  All medications you are taking. Include prescription and over-the-counter medicines, herbs and creams.  Whether you are pregnant or breast-feeding.  If you have Leber's disease, a hereditary eye condition, vitamin B12 could make it worse. RISKS AND COMPLICATIONS Reactions to an injection are   usually temporary. They might include:  Pain at the injection site.  Redness, swelling or tenderness at the site.  Headache, dizziness or weakness.  Nausea, upset stomach or diarrhea.  Numbness or tingling.  Fever.  Joint pain.  Itching or rash. If a reaction does not go away in a short while, talk with your healthcare provider. A change in the way the shots are  given, or where they are given, might need to be made. BEFORE AN INJECTION To decide whether B12 injections are right for you, your healthcare provider will probably:  Ask about your medical history.  Ask questions about your diet.  Ask about symptoms such as:  Have you felt weak?  Do you feel unusually tired?  Do you get dizzy?  Order blood tests. These may include a test to:  Check the level of red cells in your blood.  Measure B12 levels.  Check for the presence of intrinsic factor. VITAMIN B12 INJECTIONS How often you will need a vitamin B12 injection will depend on how severe your deficiency is. This also will affect how long you will need to get them. People with pernicious anemia usually get injections for their entire life. Others might get them for a shorter period. For many people, injections are given daily or weekly for several weeks. Then, once B12 levels are normal, injections are given just once a month. If the cause of the deficiency can be fixed, the injections can be stopped. Talk with your healthcare provider about what you should expect. For an injection:  The injection site will be cleaned with an alcohol swab.  Your healthcare provider will insert a needle directly into a muscle. Most any muscle can be used. Most often, an arm muscle is used. A buttocks muscle can also be used. Many people say shots in that area are less painful.  A small adhesive bandage may be put over the injection site. It usually can be taken off in an hour or less. Injections can be given by your healthcare provider. In some cases, family members give them. Sometimes, people give them to themselves. Talk with your healthcare provider about what would be best for you. If someone other than your healthcare provider will be giving the shots, the person will need to be trained to give them correctly. HOME CARE INSTRUCTIONS   You can remove the adhesive bandage within an hour of getting a  shot.  You should be able to go about your normal activities right away.  Avoid drinking large amounts of alcohol while taking vitamin B12 shots. Alcohol can interfere with the body's use of the vitamin. SEEK MEDICAL CARE IF:   Pain, redness, swelling or tenderness at the injection site does not get better or gets worse.  Headache, dizziness or weakness does not go away.  You develop a fever of more than 100.5 F (38.1 C). SEEK IMMEDIATE MEDICAL CARE IF:   You have chest pain.  You develop shortness of breath.  You have muscle weakness that gets worse.  You develop numbness, weakness or tingling on one side or one area of the body.  You have symptoms of an allergic reaction, such as:  Hives.  Difficulty breathing.  Swelling of the lips, face, tongue or throat.  You develop a fever of more than 102.0 F (38.9 C). MAKE SURE YOU:   Understand these instructions.  Will watch your condition.  Will get help right away if you are not doing well or get worse. Document   Released: 02/17/2009 Document Revised: 02/13/2012 Document Reviewed: 02/17/2009 [employer] Patient Information 2014 [employer].  

## 2013-12-06 NOTE — Progress Notes (Signed)
Patient tolerated well.

## 2014-01-07 ENCOUNTER — Ambulatory Visit (INDEPENDENT_AMBULATORY_CARE_PROVIDER_SITE_OTHER): Payer: Medicare HMO | Admitting: Family Medicine

## 2014-01-07 DIAGNOSIS — E538 Deficiency of other specified B group vitamins: Secondary | ICD-10-CM

## 2014-02-04 ENCOUNTER — Ambulatory Visit (INDEPENDENT_AMBULATORY_CARE_PROVIDER_SITE_OTHER): Payer: Medicare HMO

## 2014-02-04 DIAGNOSIS — E538 Deficiency of other specified B group vitamins: Secondary | ICD-10-CM

## 2014-02-05 ENCOUNTER — Other Ambulatory Visit (HOSPITAL_COMMUNITY): Payer: Self-pay | Admitting: Cardiology

## 2014-02-05 DIAGNOSIS — I6529 Occlusion and stenosis of unspecified carotid artery: Secondary | ICD-10-CM

## 2014-02-14 ENCOUNTER — Encounter: Payer: Self-pay | Admitting: Cardiology

## 2014-02-14 ENCOUNTER — Ambulatory Visit (HOSPITAL_COMMUNITY): Payer: Medicare HMO | Attending: Cardiology | Admitting: *Deleted

## 2014-02-14 DIAGNOSIS — I6529 Occlusion and stenosis of unspecified carotid artery: Secondary | ICD-10-CM | POA: Insufficient documentation

## 2014-02-14 NOTE — Progress Notes (Signed)
Carotid duplex complete 

## 2014-03-10 ENCOUNTER — Ambulatory Visit (INDEPENDENT_AMBULATORY_CARE_PROVIDER_SITE_OTHER): Payer: Medicare HMO | Admitting: *Deleted

## 2014-03-10 DIAGNOSIS — E538 Deficiency of other specified B group vitamins: Secondary | ICD-10-CM

## 2014-03-10 NOTE — Progress Notes (Signed)
Vitamin b12 given and tolerated well. 

## 2014-03-10 NOTE — Patient Instructions (Signed)
Vitamin B12 Injections Every person needs vitamin B12. A deficiency develops when the body does not get enough of it. One way to overcome this is by getting B12 shots (injections). A B12 shot puts the vitamin directly into muscle tissue. This avoids any problems your body might have in absorbing it from food or a pill. In some people, the body has trouble using the vitamin correctly. This can cause a B12 deficiency. Not consuming enough of the vitamin can also cause a deficiency. Getting enough vitamin B12 can be hard for elderly people. Sometimes, they do not eat a well-balanced diet. The elderly are also more likely than younger people to have medical conditions or take medications that can lead to a deficiency. WHAT DOES VITAMIN B12 DO? Vitamin B12 does many things to help the body work right:  It helps the body make healthy red blood cells.  It helps maintain nerve cells.  It is involved in the body's process of converting food into energy (metabolism).  It is needed to make the genetic material in all cells (DNA). VITAMIN B12 FOOD SOURCES Most people get plenty of vitamin B12 through the foods they eat. It is present in:  Meat, fish, poultry, and eggs.  Milk and milk products.  It also is added when certain foods are made, including some breads, cereals and yogurts. The food is then called "fortified". CAUSES The most common causes of vitamin B12 deficiency are:  Pernicious anemia. The condition develops when the body cannot make enough healthy red blood cells. This stems from a lack of a protein made in the stomach (intrinsic factor). People without this protein cannot absorb enough vitamin B12 from food.  Malabsorption. This is when the body cannot absorb the vitamin. It can be caused by:  Pernicious anemia.  Surgery to remove part or all of the stomach can lead to malabsorption. Removal of part or all of the small intestine can also cause malabsorption.  Vegetarian diet.  People who are strict about not eating foods from animals could have trouble taking in enough vitamin B12 from diet alone.  Medications. Some medicines have been linked to B12 deficiency, such as Metformin (a drug prescribed for type 2 diabetes). Long-term use of stomach acid suppressants also can keep the vitamin from being absorbed.  Intestinal problems such as inflammatory bowel disease. If there are problems in the digestive tract, vitamin B12 may not be absorbed in good enough amounts. SYMPTOMS People who do not get enough B12 can develop problems. These can include:  Anemia. This is when the body has too few red blood cells. Red blood cells carry oxygen to the rest of the body. Without a healthy supply of red blood cells, people can feel:  Tired (fatigued).  Weak.  Severe anemia can cause:  Shortness of breath.  Dizziness.  Rapid heart rate.  Paleness.  Other Vitamin B12 deficiency symptoms include:  Diarrhea.  Numbness or tingling in the hands or feet.  Loss of appetite.  Confusion.  Sores on the tongue or in the mouth. LET YOUR CAREGIVER KNOW ABOUT:  Any allergies. It is very important to know if you are allergic or sensitive to cobalt. Vitamin B12 contains cobalt.  Any history of kidney disease.  All medications you are taking. Include prescription and over-the-counter medicines, herbs and creams.  Whether you are pregnant or breast-feeding.  If you have Leber's disease, a hereditary eye condition, vitamin B12 could make it worse. RISKS AND COMPLICATIONS Reactions to an injection are   usually temporary. They might include:  Pain at the injection site.  Redness, swelling or tenderness at the site.  Headache, dizziness or weakness.  Nausea, upset stomach or diarrhea.  Numbness or tingling.  Fever.  Joint pain.  Itching or rash. If a reaction does not go away in a short while, talk with your healthcare provider. A change in the way the shots are  given, or where they are given, might need to be made. BEFORE AN INJECTION To decide whether B12 injections are right for you, your healthcare provider will probably:  Ask about your medical history.  Ask questions about your diet.  Ask about symptoms such as:  Have you felt weak?  Do you feel unusually tired?  Do you get dizzy?  Order blood tests. These may include a test to:  Check the level of red cells in your blood.  Measure B12 levels.  Check for the presence of intrinsic factor. VITAMIN B12 INJECTIONS How often you will need a vitamin B12 injection will depend on how severe your deficiency is. This also will affect how long you will need to get them. People with pernicious anemia usually get injections for their entire life. Others might get them for a shorter period. For many people, injections are given daily or weekly for several weeks. Then, once B12 levels are normal, injections are given just once a month. If the cause of the deficiency can be fixed, the injections can be stopped. Talk with your healthcare provider about what you should expect. For an injection:  The injection site will be cleaned with an alcohol swab.  Your healthcare provider will insert a needle directly into a muscle. Most any muscle can be used. Most often, an arm muscle is used. A buttocks muscle can also be used. Many people say shots in that area are less painful.  A small adhesive bandage may be put over the injection site. It usually can be taken off in an hour or less. Injections can be given by your healthcare provider. In some cases, family members give them. Sometimes, people give them to themselves. Talk with your healthcare provider about what would be best for you. If someone other than your healthcare provider will be giving the shots, the person will need to be trained to give them correctly. HOME CARE INSTRUCTIONS   You can remove the adhesive bandage within an hour of getting a  shot.  You should be able to go about your normal activities right away.  Avoid drinking large amounts of alcohol while taking vitamin B12 shots. Alcohol can interfere with the body's use of the vitamin. SEEK MEDICAL CARE IF:   Pain, redness, swelling or tenderness at the injection site does not get better or gets worse.  Headache, dizziness or weakness does not go away.  You develop a fever of more than 100.5 F (38.1 C). SEEK IMMEDIATE MEDICAL CARE IF:   You have chest pain.  You develop shortness of breath.  You have muscle weakness that gets worse.  You develop numbness, weakness or tingling on one side or one area of the body.  You have symptoms of an allergic reaction, such as:  Hives.  Difficulty breathing.  Swelling of the lips, face, tongue or throat.  You develop a fever of more than 102.0 F (38.9 C). MAKE SURE YOU:   Understand these instructions.  Will watch your condition.  Will get help right away if you are not doing well or get worse. Document   Released: 02/17/2009 Document Revised: 02/13/2012 Document Reviewed: 02/17/2009 [employer] Patient Information 2014 [employer].  

## 2014-04-10 ENCOUNTER — Ambulatory Visit (INDEPENDENT_AMBULATORY_CARE_PROVIDER_SITE_OTHER): Payer: Medicare HMO | Admitting: *Deleted

## 2014-04-10 DIAGNOSIS — E538 Deficiency of other specified B group vitamins: Secondary | ICD-10-CM

## 2014-04-10 MED ORDER — CYANOCOBALAMIN 1000 MCG/ML IJ SOLN
1000.0000 ug | INTRAMUSCULAR | Status: AC
  Administered 2014-04-10 – 2015-03-27 (×12): 1000 ug via INTRAMUSCULAR

## 2014-04-10 NOTE — Patient Instructions (Signed)
Vitamin B12 Injections Every person needs vitamin B12. A deficiency develops when the body does not get enough of it. One way to overcome this is by getting B12 shots (injections). A B12 shot puts the vitamin directly into muscle tissue. This avoids any problems your body might have in absorbing it from food or a pill. In some people, the body has trouble using the vitamin correctly. This can cause a B12 deficiency. Not consuming enough of the vitamin can also cause a deficiency. Getting enough vitamin B12 can be hard for elderly people. Sometimes, they do not eat a well-balanced diet. The elderly are also more likely than younger people to have medical conditions or take medications that can lead to a deficiency. WHAT DOES VITAMIN B12 DO? Vitamin B12 does many things to help the body work right:  It helps the body make healthy red blood cells.  It helps maintain nerve cells.  It is involved in the body's process of converting food into energy (metabolism).  It is needed to make the genetic material in all cells (DNA). VITAMIN B12 FOOD SOURCES Most people get plenty of vitamin B12 through the foods they eat. It is present in:  Meat, fish, poultry, and eggs.  Milk and milk products.  It also is added when certain foods are made, including some breads, cereals and yogurts. The food is then called "fortified". CAUSES The most common causes of vitamin B12 deficiency are:  Pernicious anemia. The condition develops when the body cannot make enough healthy red blood cells. This stems from a lack of a protein made in the stomach (intrinsic factor). People without this protein cannot absorb enough vitamin B12 from food.  Malabsorption. This is when the body cannot absorb the vitamin. It can be caused by:  Pernicious anemia.  Surgery to remove part or all of the stomach can lead to malabsorption. Removal of part or all of the small intestine can also cause malabsorption.  Vegetarian diet.  People who are strict about not eating foods from animals could have trouble taking in enough vitamin B12 from diet alone.  Medications. Some medicines have been linked to B12 deficiency, such as Metformin (a drug prescribed for type 2 diabetes). Long-term use of stomach acid suppressants also can keep the vitamin from being absorbed.  Intestinal problems such as inflammatory bowel disease. If there are problems in the digestive tract, vitamin B12 may not be absorbed in good enough amounts. SYMPTOMS People who do not get enough B12 can develop problems. These can include:  Anemia. This is when the body has too few red blood cells. Red blood cells carry oxygen to the rest of the body. Without a healthy supply of red blood cells, people can feel:  Tired (fatigued).  Weak.  Severe anemia can cause:  Shortness of breath.  Dizziness.  Rapid heart rate.  Paleness.  Other Vitamin B12 deficiency symptoms include:  Diarrhea.  Numbness or tingling in the hands or feet.  Loss of appetite.  Confusion.  Sores on the tongue or in the mouth. LET YOUR CAREGIVER KNOW ABOUT:  Any allergies. It is very important to know if you are allergic or sensitive to cobalt. Vitamin B12 contains cobalt.  Any history of kidney disease.  All medications you are taking. Include prescription and over-the-counter medicines, herbs and creams.  Whether you are pregnant or breast-feeding.  If you have Leber's disease, a hereditary eye condition, vitamin B12 could make it worse. RISKS AND COMPLICATIONS Reactions to an injection are   usually temporary. They might include:  Pain at the injection site.  Redness, swelling or tenderness at the site.  Headache, dizziness or weakness.  Nausea, upset stomach or diarrhea.  Numbness or tingling.  Fever.  Joint pain.  Itching or rash. If a reaction does not go away in a short while, talk with your healthcare provider. A change in the way the shots are  given, or where they are given, might need to be made. BEFORE AN INJECTION To decide whether B12 injections are right for you, your healthcare provider will probably:  Ask about your medical history.  Ask questions about your diet.  Ask about symptoms such as:  Have you felt weak?  Do you feel unusually tired?  Do you get dizzy?  Order blood tests. These may include a test to:  Check the level of red cells in your blood.  Measure B12 levels.  Check for the presence of intrinsic factor. VITAMIN B12 INJECTIONS How often you will need a vitamin B12 injection will depend on how severe your deficiency is. This also will affect how long you will need to get them. People with pernicious anemia usually get injections for their entire life. Others might get them for a shorter period. For many people, injections are given daily or weekly for several weeks. Then, once B12 levels are normal, injections are given just once a month. If the cause of the deficiency can be fixed, the injections can be stopped. Talk with your healthcare provider about what you should expect. For an injection:  The injection site will be cleaned with an alcohol swab.  Your healthcare provider will insert a needle directly into a muscle. Most any muscle can be used. Most often, an arm muscle is used. A buttocks muscle can also be used. Many people say shots in that area are less painful.  A small adhesive bandage may be put over the injection site. It usually can be taken off in an hour or less. Injections can be given by your healthcare provider. In some cases, family members give them. Sometimes, people give them to themselves. Talk with your healthcare provider about what would be best for you. If someone other than your healthcare provider will be giving the shots, the person will need to be trained to give them correctly. HOME CARE INSTRUCTIONS   You can remove the adhesive bandage within an hour of getting a  shot.  You should be able to go about your normal activities right away.  Avoid drinking large amounts of alcohol while taking vitamin B12 shots. Alcohol can interfere with the body's use of the vitamin. SEEK MEDICAL CARE IF:   Pain, redness, swelling or tenderness at the injection site does not get better or gets worse.  Headache, dizziness or weakness does not go away.  You develop a fever of more than 100.5 F (38.1 C). SEEK IMMEDIATE MEDICAL CARE IF:   You have chest pain.  You develop shortness of breath.  You have muscle weakness that gets worse.  You develop numbness, weakness or tingling on one side or one area of the body.  You have symptoms of an allergic reaction, such as:  Hives.  Difficulty breathing.  Swelling of the lips, face, tongue or throat.  You develop a fever of more than 102.0 F (38.9 C). MAKE SURE YOU:   Understand these instructions.  Will watch your condition.  Will get help right away if you are not doing well or get worse. Document   Released: 02/17/2009 Document Revised: 02/13/2012 Document Reviewed: 02/17/2009 [employer] Patient Information 2014 [employer].  

## 2014-04-10 NOTE — Progress Notes (Signed)
Vitamin b12 injection given and tolerated well.  

## 2014-05-13 ENCOUNTER — Ambulatory Visit (INDEPENDENT_AMBULATORY_CARE_PROVIDER_SITE_OTHER): Payer: Medicare HMO | Admitting: *Deleted

## 2014-05-13 ENCOUNTER — Other Ambulatory Visit: Payer: Self-pay | Admitting: Family Medicine

## 2014-05-13 DIAGNOSIS — E538 Deficiency of other specified B group vitamins: Secondary | ICD-10-CM

## 2014-05-13 DIAGNOSIS — I2510 Atherosclerotic heart disease of native coronary artery without angina pectoris: Secondary | ICD-10-CM

## 2014-05-13 DIAGNOSIS — I10 Essential (primary) hypertension: Secondary | ICD-10-CM

## 2014-05-13 NOTE — Progress Notes (Signed)
Vitamin b12 given and tolerated well. 

## 2014-05-13 NOTE — Patient Instructions (Signed)
Vitamin B12 Injections Every person needs vitamin B12. A deficiency develops when the body does not get enough of it. One way to overcome this is by getting B12 shots (injections). A B12 shot puts the vitamin directly into muscle tissue. This avoids any problems your body might have in absorbing it from food or a pill. In some people, the body has trouble using the vitamin correctly. This can cause a B12 deficiency. Not consuming enough of the vitamin can also cause a deficiency. Getting enough vitamin B12 can be hard for elderly people. Sometimes, they do not eat a well-balanced diet. The elderly are also more likely than younger people to have medical conditions or take medications that can lead to a deficiency. WHAT DOES VITAMIN B12 DO? Vitamin B12 does many things to help the body work right:  It helps the body make healthy red blood cells.  It helps maintain nerve cells.  It is involved in the body's process of converting food into energy (metabolism).  It is needed to make the genetic material in all cells (DNA). VITAMIN B12 FOOD SOURCES Most people get plenty of vitamin B12 through the foods they eat. It is present in:  Meat, fish, poultry, and eggs.  Milk and milk products.  It also is added when certain foods are made, including some breads, cereals and yogurts. The food is then called "fortified". CAUSES The most common causes of vitamin B12 deficiency are:  Pernicious anemia. The condition develops when the body cannot make enough healthy red blood cells. This stems from a lack of a protein made in the stomach (intrinsic factor). People without this protein cannot absorb enough vitamin B12 from food.  Malabsorption. This is when the body cannot absorb the vitamin. It can be caused by:  Pernicious anemia.  Surgery to remove part or all of the stomach can lead to malabsorption. Removal of part or all of the small intestine can also cause malabsorption.  Vegetarian diet.  People who are strict about not eating foods from animals could have trouble taking in enough vitamin B12 from diet alone.  Medications. Some medicines have been linked to B12 deficiency, such as Metformin (a drug prescribed for type 2 diabetes). Long-term use of stomach acid suppressants also can keep the vitamin from being absorbed.  Intestinal problems such as inflammatory bowel disease. If there are problems in the digestive tract, vitamin B12 may not be absorbed in good enough amounts. SYMPTOMS People who do not get enough B12 can develop problems. These can include:  Anemia. This is when the body has too few red blood cells. Red blood cells carry oxygen to the rest of the body. Without a healthy supply of red blood cells, people can feel:  Tired (fatigued).  Weak.  Severe anemia can cause:  Shortness of breath.  Dizziness.  Rapid heart rate.  Paleness.  Other Vitamin B12 deficiency symptoms include:  Diarrhea.  Numbness or tingling in the hands or feet.  Loss of appetite.  Confusion.  Sores on the tongue or in the mouth. LET YOUR CAREGIVER KNOW ABOUT:  Any allergies. It is very important to know if you are allergic or sensitive to cobalt. Vitamin B12 contains cobalt.  Any history of kidney disease.  All medications you are taking. Include prescription and over-the-counter medicines, herbs and creams.  Whether you are pregnant or breast-feeding.  If you have Leber's disease, a hereditary eye condition, vitamin B12 could make it worse. RISKS AND COMPLICATIONS Reactions to an injection are   usually temporary. They might include:  Pain at the injection site.  Redness, swelling or tenderness at the site.  Headache, dizziness or weakness.  Nausea, upset stomach or diarrhea.  Numbness or tingling.  Fever.  Joint pain.  Itching or rash. If a reaction does not go away in a short while, talk with your healthcare provider. A change in the way the shots are  given, or where they are given, might need to be made. BEFORE AN INJECTION To decide whether B12 injections are right for you, your healthcare provider will probably:  Ask about your medical history.  Ask questions about your diet.  Ask about symptoms such as:  Have you felt weak?  Do you feel unusually tired?  Do you get dizzy?  Order blood tests. These may include a test to:  Check the level of red cells in your blood.  Measure B12 levels.  Check for the presence of intrinsic factor. VITAMIN B12 INJECTIONS How often you will need a vitamin B12 injection will depend on how severe your deficiency is. This also will affect how long you will need to get them. People with pernicious anemia usually get injections for their entire life. Others might get them for a shorter period. For many people, injections are given daily or weekly for several weeks. Then, once B12 levels are normal, injections are given just once a month. If the cause of the deficiency can be fixed, the injections can be stopped. Talk with your healthcare provider about what you should expect. For an injection:  The injection site will be cleaned with an alcohol swab.  Your healthcare provider will insert a needle directly into a muscle. Most any muscle can be used. Most often, an arm muscle is used. A buttocks muscle can also be used. Many people say shots in that area are less painful.  A small adhesive bandage may be put over the injection site. It usually can be taken off in an hour or less. Injections can be given by your healthcare provider. In some cases, family members give them. Sometimes, people give them to themselves. Talk with your healthcare provider about what would be best for you. If someone other than your healthcare provider will be giving the shots, the person will need to be trained to give them correctly. HOME CARE INSTRUCTIONS   You can remove the adhesive bandage within an hour of getting a  shot.  You should be able to go about your normal activities right away.  Avoid drinking large amounts of alcohol while taking vitamin B12 shots. Alcohol can interfere with the body's use of the vitamin. SEEK MEDICAL CARE IF:   Pain, redness, swelling or tenderness at the injection site does not get better or gets worse.  Headache, dizziness or weakness does not go away.  You develop a fever of more than 100.5 F (38.1 C). SEEK IMMEDIATE MEDICAL CARE IF:   You have chest pain.  You develop shortness of breath.  You have muscle weakness that gets worse.  You develop numbness, weakness or tingling on one side or one area of the body.  You have symptoms of an allergic reaction, such as:  Hives.  Difficulty breathing.  Swelling of the lips, face, tongue or throat.  You develop a fever of more than 102.0 F (38.9 C). MAKE SURE YOU:   Understand these instructions.  Will watch your condition.  Will get help right away if you are not doing well or get worse. Document   Released: 02/17/2009 Document Revised: 02/13/2012 Document Reviewed: 02/17/2009 [employer] Patient Information 2014 [employer].  

## 2014-05-14 ENCOUNTER — Ambulatory Visit (INDEPENDENT_AMBULATORY_CARE_PROVIDER_SITE_OTHER): Payer: Medicare HMO | Admitting: Family Medicine

## 2014-05-14 VITALS — BP 141/71 | HR 97 | Temp 97.0°F | Wt 195.6 lb

## 2014-05-14 DIAGNOSIS — I2510 Atherosclerotic heart disease of native coronary artery without angina pectoris: Secondary | ICD-10-CM

## 2014-05-14 DIAGNOSIS — G4700 Insomnia, unspecified: Secondary | ICD-10-CM

## 2014-05-14 DIAGNOSIS — R350 Frequency of micturition: Secondary | ICD-10-CM

## 2014-05-14 DIAGNOSIS — I10 Essential (primary) hypertension: Secondary | ICD-10-CM

## 2014-05-14 DIAGNOSIS — M129 Arthropathy, unspecified: Secondary | ICD-10-CM

## 2014-05-14 DIAGNOSIS — R079 Chest pain, unspecified: Secondary | ICD-10-CM

## 2014-05-14 DIAGNOSIS — M1990 Unspecified osteoarthritis, unspecified site: Secondary | ICD-10-CM

## 2014-05-14 DIAGNOSIS — E538 Deficiency of other specified B group vitamins: Secondary | ICD-10-CM

## 2014-05-14 DIAGNOSIS — F411 Generalized anxiety disorder: Secondary | ICD-10-CM

## 2014-05-14 LAB — POCT CBC
Granulocyte percent: 72.9 %G (ref 37–80)
HCT, POC: 43.7 % (ref 43.5–53.7)
Hemoglobin: 14.5 g/dL (ref 14.1–18.1)
Lymph, poc: 2 (ref 0.6–3.4)
MCH, POC: 30.9 pg (ref 27–31.2)
MCHC: 33.2 g/dL (ref 31.8–35.4)
MCV: 93 fL (ref 80–97)
MPV: 7.9 fL (ref 0–99.8)
POC Granulocyte: 6.1 (ref 2–6.9)
POC LYMPH PERCENT: 23.5 %L (ref 10–50)
Platelet Count, POC: 246 10*3/uL (ref 142–424)
RBC: 4.7 M/uL (ref 4.69–6.13)
RDW, POC: 15.2 %
WBC: 8.3 10*3/uL (ref 4.6–10.2)

## 2014-05-14 LAB — POCT UA - MICROSCOPIC ONLY
Bacteria, U Microscopic: NEGATIVE
Casts, Ur, LPF, POC: NEGATIVE
Crystals, Ur, HPF, POC: NEGATIVE
Yeast, UA: NEGATIVE

## 2014-05-14 LAB — POCT URINALYSIS DIPSTICK
Bilirubin, UA: NEGATIVE
Blood, UA: NEGATIVE
Glucose, UA: NEGATIVE
Ketones, UA: NEGATIVE
Nitrite, UA: NEGATIVE
Protein, UA: NEGATIVE
Spec Grav, UA: 1.02
Urobilinogen, UA: NEGATIVE
pH, UA: 6

## 2014-05-14 MED ORDER — AMITRIPTYLINE HCL 25 MG PO TABS: 25.0000 mg | ORAL_TABLET | Freq: Every day | ORAL | Status: DC

## 2014-05-14 MED ORDER — CITALOPRAM HYDROBROMIDE 20 MG PO TABS: 20.0000 mg | ORAL_TABLET | Freq: Every day | ORAL | Status: DC

## 2014-05-14 MED ORDER — MELOXICAM 15 MG PO TABS: 15.0000 mg | ORAL_TABLET | Freq: Every day | ORAL | Status: DC

## 2014-05-14 MED ORDER — AMLODIPINE BESYLATE 10 MG PO TABS: 10.0000 mg | ORAL_TABLET | Freq: Every day | ORAL | Status: DC

## 2014-05-14 NOTE — Progress Notes (Signed)
   Subjective:    Patient ID: Casey Klein, male    DOB: 08/24/1941, 73 y.o.   MRN: [id number]  HPI  This 73 y.o. male presents for evaluation of routine follow up.  He has hx of hypertension, b12 def, CAD, arthritis, chronic back pain, and anxiety.  He states he needs nerve medicine. He states he hurts a lot from artritis pain and back pain.  He states he has some problems with urinary frequency.  He States he gets chest pain on occasion.  He has hx of CAD and carotid artery stenosis.  He has seen Dr. Rosie Fate over a year ago.   Review of Systems C/o urinary freq, arthritis, back pain, anxiety, insomnia, chest pain No  SOB, HA, dizziness, vision change, N/V, diarrhea, constipation, dysuria, urinary urgency or frequency or rash.     Objective:   Physical Exam Vital signs noted  Well developed well nourished male.  HEENT - Head atraumatic Normocephalic                Eyes - PERRLA, Conjuctiva - clear Sclera- Clear EOMI                Ears - EAC's Wnl TM's Wnl Gross Hearing WNL                Throat - oropharanx wnl Respiratory - Lungs CTA bilateral Cardiac - RRR S1 and S2 without murmur GI - Abdomen soft Nontender and bowel sounds active x 4 GU - Testes nontender, penis uncircumcised, no inguinal hernia Extremities - No edema. Neuro - Grossly intact.       Assessment & Plan:  CAD (coronary artery disease) - Plan: amLODipine (NORVASC) 10 MG tablet, citalopram (CELEXA) 20 MG tablet, amitriptyline (ELAVIL) 25 MG tablet, meloxicam (MOBIC) 15 MG tablet, POCT CBC, CMP14+EGFR, TSH, Vitamin B12, Lipid panel  Essential hypertension, benign - Plan: amLODipine (NORVASC) 10 MG tablet, citalopram (CELEXA) 20 MG tablet, amitriptyline (ELAVIL) 25 MG tablet, meloxicam (MOBIC) 15 MG tablet, POCT CBC, CMP14+EGFR, TSH, Vitamin B12, Lipid panel  B12 deficiency - Plan: amLODipine (NORVASC) 10 MG tablet, citalopram (CELEXA) 20 MG tablet, amitriptyline (ELAVIL) 25 MG tablet, meloxicam (MOBIC) 15  MG tablet, POCT CBC, CMP14+EGFR, TSH, Vitamin B12, Lipid panel  Arthritis - Plan: amLODipine (NORVASC) 10 MG tablet, citalopram (CELEXA) 20 MG tablet, amitriptyline (ELAVIL) 25 MG tablet, meloxicam (MOBIC) 15 MG tablet, POCT CBC, CMP14+EGFR, TSH, Vitamin B12, Lipid panel  Anxiety state, unspecified - Plan: amLODipine (NORVASC) 10 MG tablet, citalopram (CELEXA) 20 MG tablet, amitriptyline (ELAVIL) 25 MG tablet, meloxicam (MOBIC) 15 MG tablet, POCT CBC, CMP14+EGFR, TSH, Vitamin B12, Lipid panel  Insomnia - Plan: amLODipine (NORVASC) 10 MG tablet, citalopram (CELEXA) 20 MG tablet, amitriptyline (ELAVIL) 25 MG tablet, meloxicam (MOBIC) 15 MG tablet, POCT CBC, CMP14+EGFR, TSH, Vitamin B12, Lipid panel  Urine frequency - Plan: POCT urinalysis dipstick, POCT UA - Microscopic Only  Follow up in one month  Lysbeth Penner FNP

## 2014-05-14 NOTE — Addendum Note (Signed)
Addended by: Prescott Gum on: 05/14/2014 10:57 AM   Modules accepted: Orders

## 2014-05-15 LAB — CMP14+EGFR
ALT: 24 IU/L (ref 0–44)
AST: 20 IU/L (ref 0–40)
Albumin/Globulin Ratio: 2.2 (ref 1.1–2.5)
Albumin: 4.7 g/dL (ref 3.5–4.8)
Alkaline Phosphatase: 53 IU/L (ref 39–117)
BUN/Creatinine Ratio: 15 (ref 10–22)
BUN: 13 mg/dL (ref 8–27)
CO2: 24 mmol/L (ref 18–29)
Calcium: 9.1 mg/dL (ref 8.6–10.2)
Chloride: 105 mmol/L (ref 97–108)
Creatinine, Ser: 0.87 mg/dL (ref 0.76–1.27)
GFR calc Af Amer: 98 mL/min/{1.73_m2} (ref >59)
GFR calc non Af Amer: 85 mL/min/{1.73_m2} (ref >59)
Globulin, Total: 2.1 g/dL (ref 1.5–4.5)
Glucose: 129 mg/dL — ABNORMAL HIGH (ref 65–99)
Potassium: 3.9 mmol/L (ref 3.5–5.2)
Sodium: 143 mmol/L (ref 134–144)
Total Bilirubin: 0.8 mg/dL (ref 0.0–1.2)
Total Protein: 6.8 g/dL (ref 6.0–8.5)

## 2014-05-15 LAB — VITAMIN B12: Vitamin B-12: 1510 pg/mL — ABNORMAL HIGH (ref 211–946)

## 2014-05-15 LAB — LIPID PANEL
Chol/HDL Ratio: 4.8 ratio units (ref 0.0–5.0)
Cholesterol, Total: 187 mg/dL (ref 100–199)
HDL: 39 mg/dL — ABNORMAL LOW (ref >39)
LDL Calculated: 95 mg/dL (ref 0–99)
Triglycerides: 265 mg/dL — ABNORMAL HIGH (ref 0–149)
VLDL Cholesterol Cal: 53 mg/dL — ABNORMAL HIGH (ref 5–40)

## 2014-05-15 LAB — TSH: TSH: 1.87 u[IU]/mL (ref 0.450–4.500)

## 2014-05-16 ENCOUNTER — Other Ambulatory Visit: Payer: Self-pay | Admitting: Family Medicine

## 2014-05-16 LAB — URINE CULTURE

## 2014-05-16 MED ORDER — CIPROFLOXACIN HCL 500 MG PO TABS: 500.0000 mg | ORAL_TABLET | Freq: Two times a day (BID) | ORAL | Status: DC

## 2014-05-20 NOTE — Telephone Encounter (Signed)
Medicine called into [facility] 05-13-14

## 2014-05-30 NOTE — Telephone Encounter (Signed)
RX called into [facility] 05-13-14.

## 2014-06-10 ENCOUNTER — Other Ambulatory Visit: Payer: Self-pay | Admitting: Family Medicine

## 2014-06-13 ENCOUNTER — Ambulatory Visit: Payer: Medicare HMO

## 2014-06-13 ENCOUNTER — Ambulatory Visit (INDEPENDENT_AMBULATORY_CARE_PROVIDER_SITE_OTHER): Payer: Medicare HMO | Admitting: *Deleted

## 2014-06-13 DIAGNOSIS — E538 Deficiency of other specified B group vitamins: Secondary | ICD-10-CM

## 2014-06-13 NOTE — Progress Notes (Signed)
Vitamin b12 given and tolerated well. 

## 2014-06-13 NOTE — Patient Instructions (Signed)
Vitamin B12 Injections Every person needs vitamin B12. A deficiency develops when the body does not get enough of it. One way to overcome this is by getting B12 shots (injections). A B12 shot puts the vitamin directly into muscle tissue. This avoids any problems your body might have in absorbing it from food or a pill. In some people, the body has trouble using the vitamin correctly. This can cause a B12 deficiency. Not consuming enough of the vitamin can also cause a deficiency. Getting enough vitamin B12 can be hard for elderly people. Sometimes, they do not eat a well-balanced diet. The elderly are also more likely than younger people to have medical conditions or take medications that can lead to a deficiency. WHAT DOES VITAMIN B12 DO? Vitamin B12 does many things to help the body work right:  It helps the body make healthy red blood cells.  It helps maintain nerve cells.  It is involved in the body's process of converting food into energy (metabolism).  It is needed to make the genetic material in all cells (DNA). VITAMIN B12 FOOD SOURCES Most people get plenty of vitamin B12 through the foods they eat. It is present in:  Meat, fish, poultry, and eggs.  Milk and milk products.  It also is added when certain foods are made, including some breads, cereals and yogurts. The food is then called "fortified". CAUSES The most common causes of vitamin B12 deficiency are:  Pernicious anemia. The condition develops when the body cannot make enough healthy red blood cells. This stems from a lack of a protein made in the stomach (intrinsic factor). People without this protein cannot absorb enough vitamin B12 from food.  Malabsorption. This is when the body cannot absorb the vitamin. It can be caused by:  Pernicious anemia.  Surgery to remove part or all of the stomach can lead to malabsorption. Removal of part or all of the small intestine can also cause malabsorption.  Vegetarian diet.  People who are strict about not eating foods from animals could have trouble taking in enough vitamin B12 from diet alone.  Medications. Some medicines have been linked to B12 deficiency, such as Metformin (a drug prescribed for type 2 diabetes). Long-term use of stomach acid suppressants also can keep the vitamin from being absorbed.  Intestinal problems such as inflammatory bowel disease. If there are problems in the digestive tract, vitamin B12 may not be absorbed in good enough amounts. SYMPTOMS People who do not get enough B12 can develop problems. These can include:  Anemia. This is when the body has too few red blood cells. Red blood cells carry oxygen to the rest of the body. Without a healthy supply of red blood cells, people can feel:  Tired (fatigued).  Weak.  Severe anemia can cause:  Shortness of breath.  Dizziness.  Rapid heart rate.  Paleness.  Other Vitamin B12 deficiency symptoms include:  Diarrhea.  Numbness or tingling in the hands or feet.  Loss of appetite.  Confusion.  Sores on the tongue or in the mouth. LET YOUR CAREGIVER KNOW ABOUT:  Any allergies. It is very important to know if you are allergic or sensitive to cobalt. Vitamin B12 contains cobalt.  Any history of kidney disease.  All medications you are taking. Include prescription and over-the-counter medicines, herbs and creams.  Whether you are pregnant or breast-feeding.  If you have Leber's disease, a hereditary eye condition, vitamin B12 could make it worse. RISKS AND COMPLICATIONS Reactions to an injection are   usually temporary. They might include:  Pain at the injection site.  Redness, swelling or tenderness at the site.  Headache, dizziness or weakness.  Nausea, upset stomach or diarrhea.  Numbness or tingling.  Fever.  Joint pain.  Itching or rash. If a reaction does not go away in a short while, talk with your healthcare provider. A change in the way the shots are  given, or where they are given, might need to be made. BEFORE AN INJECTION To decide whether B12 injections are right for you, your healthcare provider will probably:  Ask about your medical history.  Ask questions about your diet.  Ask about symptoms such as:  Have you felt weak?  Do you feel unusually tired?  Do you get dizzy?  Order blood tests. These may include a test to:  Check the level of red cells in your blood.  Measure B12 levels.  Check for the presence of intrinsic factor. VITAMIN B12 INJECTIONS How often you will need a vitamin B12 injection will depend on how severe your deficiency is. This also will affect how long you will need to get them. People with pernicious anemia usually get injections for their entire life. Others might get them for a shorter period. For many people, injections are given daily or weekly for several weeks. Then, once B12 levels are normal, injections are given just once a month. If the cause of the deficiency can be fixed, the injections can be stopped. Talk with your healthcare provider about what you should expect. For an injection:  The injection site will be cleaned with an alcohol swab.  Your healthcare provider will insert a needle directly into a muscle. Most any muscle can be used. Most often, an arm muscle is used. A buttocks muscle can also be used. Many people say shots in that area are less painful.  A small adhesive bandage may be put over the injection site. It usually can be taken off in an hour or less. Injections can be given by your healthcare provider. In some cases, family members give them. Sometimes, people give them to themselves. Talk with your healthcare provider about what would be best for you. If someone other than your healthcare provider will be giving the shots, the person will need to be trained to give them correctly. HOME CARE INSTRUCTIONS   You can remove the adhesive bandage within an hour of getting a  shot.  You should be able to go about your normal activities right away.  Avoid drinking large amounts of alcohol while taking vitamin B12 shots. Alcohol can interfere with the body's use of the vitamin. SEEK MEDICAL CARE IF:   Pain, redness, swelling or tenderness at the injection site does not get better or gets worse.  Headache, dizziness or weakness does not go away.  You develop a fever of more than 100.5 F (38.1 C). SEEK IMMEDIATE MEDICAL CARE IF:   You have chest pain.  You develop shortness of breath.  You have muscle weakness that gets worse.  You develop numbness, weakness or tingling on one side or one area of the body.  You have symptoms of an allergic reaction, such as:  Hives.  Difficulty breathing.  Swelling of the lips, face, tongue or throat.  You develop a fever of more than 102.0 F (38.9 C). MAKE SURE YOU:   Understand these instructions.  Will watch your condition.  Will get help right away if you are not doing well or get worse. Document   Released: 02/17/2009 Document Revised: 02/13/2012 Document Reviewed: 02/17/2009 [employer] Patient Information 2015 [employer]. This information is not intended to replace advice given to you by your health care provider. Make sure you discuss any questions you have with your health care provider.  

## 2014-06-16 ENCOUNTER — Ambulatory Visit: Payer: Medicare HMO | Admitting: Family Medicine

## 2014-06-17 ENCOUNTER — Encounter: Payer: Self-pay | Admitting: Family Medicine

## 2014-06-17 ENCOUNTER — Ambulatory Visit (INDEPENDENT_AMBULATORY_CARE_PROVIDER_SITE_OTHER): Payer: Medicare HMO | Admitting: Family Medicine

## 2014-06-17 VITALS — BP 121/56 | HR 92 | Temp 97.7°F | Ht 69.0 in | Wt 195.8 lb

## 2014-06-17 DIAGNOSIS — G4700 Insomnia, unspecified: Secondary | ICD-10-CM

## 2014-06-17 DIAGNOSIS — N390 Urinary tract infection, site not specified: Secondary | ICD-10-CM

## 2014-06-17 NOTE — Progress Notes (Signed)
   Subjective:    Patient ID: Casey Klein, male    DOB: 02-19-1941, 73 y.o.   MRN: [id number]  HPI  This 73 y.o. male presents for evaluation of hypertension, insomnia, and uti.  He did not p/u rx for cipro yet.  He was found to have uti on ua cx and cipro was sent to pharm for 14 days.  He has been taking elavil 25mg  po qhs and finds it too strong.  He is doing a lot better with his bp control since adding norvasc 10mg  po qd.  Review of Systems C/o urinary frequency   No chest pain, SOB, HA, dizziness, vision change, N/V, diarrhea, constipation, dysuria, urinary urgency or frequency, myalgias, arthralgias or rash.  Objective:   Physical Exam Vital signs noted  Well developed well nourished male.  HEENT - Head atraumatic Normocephalic                Eyes - PERRLA, Conjuctiva - clear Sclera- Clear EOMI                Ears - EAC's Wnl TM's Wnl Gross Hearing WNL                Nose - Nares patent                 Throat - oropharanx wnl Respiratory - Lungs CTA bilateral Cardiac - RRR S1 and S2 without murmur GI - Abdomen soft Nontender and bowel sounds active x 4 Extremities - No edema. Neuro - Grossly intact.       Assessment & Plan:  Insomnia -Cut elavil in half and take qhs prn  Urinary tract infection, site not specified - Pick up cipro rx sent to pharm  HTN - BP controlled

## 2014-07-14 ENCOUNTER — Ambulatory Visit (INDEPENDENT_AMBULATORY_CARE_PROVIDER_SITE_OTHER): Payer: Medicare HMO | Admitting: *Deleted

## 2014-07-14 DIAGNOSIS — E538 Deficiency of other specified B group vitamins: Secondary | ICD-10-CM

## 2014-07-14 NOTE — Patient Instructions (Signed)
Vitamin B12 Injections Every person needs vitamin B12. A deficiency develops when the body does not get enough of it. One way to overcome this is by getting B12 shots (injections). A B12 shot puts the vitamin directly into muscle tissue. This avoids any problems your body might have in absorbing it from food or a pill. In some people, the body has trouble using the vitamin correctly. This can cause a B12 deficiency. Not consuming enough of the vitamin can also cause a deficiency. Getting enough vitamin B12 can be hard for elderly people. Sometimes, they do not eat a well-balanced diet. The elderly are also more likely than younger people to have medical conditions or take medications that can lead to a deficiency. WHAT DOES VITAMIN B12 DO? Vitamin B12 does many things to help the body work right:  It helps the body make healthy red blood cells.  It helps maintain nerve cells.  It is involved in the body's process of converting food into energy (metabolism).  It is needed to make the genetic material in all cells (DNA). VITAMIN B12 FOOD SOURCES Most people get plenty of vitamin B12 through the foods they eat. It is present in:  Meat, fish, poultry, and eggs.  Milk and milk products.  It also is added when certain foods are made, including some breads, cereals and yogurts. The food is then called "fortified". CAUSES The most common causes of vitamin B12 deficiency are:  Pernicious anemia. The condition develops when the body cannot make enough healthy red blood cells. This stems from a lack of a protein made in the stomach (intrinsic factor). People without this protein cannot absorb enough vitamin B12 from food.  Malabsorption. This is when the body cannot absorb the vitamin. It can be caused by:  Pernicious anemia.  Surgery to remove part or all of the stomach can lead to malabsorption. Removal of part or all of the small intestine can also cause malabsorption.  Vegetarian diet.  People who are strict about not eating foods from animals could have trouble taking in enough vitamin B12 from diet alone.  Medications. Some medicines have been linked to B12 deficiency, such as Metformin (a drug prescribed for type 2 diabetes). Long-term use of stomach acid suppressants also can keep the vitamin from being absorbed.  Intestinal problems such as inflammatory bowel disease. If there are problems in the digestive tract, vitamin B12 may not be absorbed in good enough amounts. SYMPTOMS People who do not get enough B12 can develop problems. These can include:  Anemia. This is when the body has too few red blood cells. Red blood cells carry oxygen to the rest of the body. Without a healthy supply of red blood cells, people can feel:  Tired (fatigued).  Weak.  Severe anemia can cause:  Shortness of breath.  Dizziness.  Rapid heart rate.  Paleness.  Other Vitamin B12 deficiency symptoms include:  Diarrhea.  Numbness or tingling in the hands or feet.  Loss of appetite.  Confusion.  Sores on the tongue or in the mouth. LET YOUR CAREGIVER KNOW ABOUT:  Any allergies. It is very important to know if you are allergic or sensitive to cobalt. Vitamin B12 contains cobalt.  Any history of kidney disease.  All medications you are taking. Include prescription and over-the-counter medicines, herbs and creams.  Whether you are pregnant or breast-feeding.  If you have Leber's disease, a hereditary eye condition, vitamin B12 could make it worse. RISKS AND COMPLICATIONS Reactions to an injection are   usually temporary. They might include:  Pain at the injection site.  Redness, swelling or tenderness at the site.  Headache, dizziness or weakness.  Nausea, upset stomach or diarrhea.  Numbness or tingling.  Fever.  Joint pain.  Itching or rash. If a reaction does not go away in a short while, talk with your healthcare provider. A change in the way the shots are  given, or where they are given, might need to be made. BEFORE AN INJECTION To decide whether B12 injections are right for you, your healthcare provider will probably:  Ask about your medical history.  Ask questions about your diet.  Ask about symptoms such as:  Have you felt weak?  Do you feel unusually tired?  Do you get dizzy?  Order blood tests. These may include a test to:  Check the level of red cells in your blood.  Measure B12 levels.  Check for the presence of intrinsic factor. VITAMIN B12 INJECTIONS How often you will need a vitamin B12 injection will depend on how severe your deficiency is. This also will affect how long you will need to get them. People with pernicious anemia usually get injections for their entire life. Others might get them for a shorter period. For many people, injections are given daily or weekly for several weeks. Then, once B12 levels are normal, injections are given just once a month. If the cause of the deficiency can be fixed, the injections can be stopped. Talk with your healthcare provider about what you should expect. For an injection:  The injection site will be cleaned with an alcohol swab.  Your healthcare provider will insert a needle directly into a muscle. Most any muscle can be used. Most often, an arm muscle is used. A buttocks muscle can also be used. Many people say shots in that area are less painful.  A small adhesive bandage may be put over the injection site. It usually can be taken off in an hour or less. Injections can be given by your healthcare provider. In some cases, family members give them. Sometimes, people give them to themselves. Talk with your healthcare provider about what would be best for you. If someone other than your healthcare provider will be giving the shots, the person will need to be trained to give them correctly. HOME CARE INSTRUCTIONS   You can remove the adhesive bandage within an hour of getting a  shot.  You should be able to go about your normal activities right away.  Avoid drinking large amounts of alcohol while taking vitamin B12 shots. Alcohol can interfere with the body's use of the vitamin. SEEK MEDICAL CARE IF:   Pain, redness, swelling or tenderness at the injection site does not get better or gets worse.  Headache, dizziness or weakness does not go away.  You develop a fever of more than 100.5 F (38.1 C). SEEK IMMEDIATE MEDICAL CARE IF:   You have chest pain.  You develop shortness of breath.  You have muscle weakness that gets worse.  You develop numbness, weakness or tingling on one side or one area of the body.  You have symptoms of an allergic reaction, such as:  Hives.  Difficulty breathing.  Swelling of the lips, face, tongue or throat.  You develop a fever of more than 102.0 F (38.9 C). MAKE SURE YOU:   Understand these instructions.  Will watch your condition.  Will get help right away if you are not doing well or get worse. Document   Released: 02/17/2009 Document Revised: 02/13/2012 Document Reviewed: 02/17/2009 [employer] Patient Information 2015 [employer]. This information is not intended to replace advice given to you by your health care provider. Make sure you discuss any questions you have with your health care provider.  

## 2014-07-14 NOTE — Progress Notes (Signed)
Patient tolerated injection well.

## 2014-07-23 ENCOUNTER — Ambulatory Visit (INDEPENDENT_AMBULATORY_CARE_PROVIDER_SITE_OTHER): Payer: Commercial Managed Care - HMO | Admitting: Cardiology

## 2014-07-23 ENCOUNTER — Ambulatory Visit: Payer: Commercial Managed Care - HMO | Admitting: Cardiology

## 2014-07-23 ENCOUNTER — Encounter: Payer: Self-pay | Admitting: Cardiology

## 2014-07-23 VITALS — BP 140/78 | HR 91 | Ht 69.0 in | Wt 187.0 lb

## 2014-07-23 DIAGNOSIS — I2510 Atherosclerotic heart disease of native coronary artery without angina pectoris: Secondary | ICD-10-CM

## 2014-07-23 DIAGNOSIS — I10 Essential (primary) hypertension: Secondary | ICD-10-CM

## 2014-07-23 DIAGNOSIS — I6529 Occlusion and stenosis of unspecified carotid artery: Secondary | ICD-10-CM

## 2014-07-23 NOTE — Patient Instructions (Signed)
The current medical regimen is effective;  continue present plan and medications.  Follow up in 18 months with Dr. Hochrein in [place].  You will receive a letter in the mail 2 months before you are due.  Please call us when you receive this letter to schedule your follow up appointment.  

## 2014-07-23 NOTE — Progress Notes (Signed)
HPI The patient presents for evaluation of chest pain. He has had a cardiac catheterization in the past demonstrating 30% LAD stenosis. The patient presents for followup of this and his hypertension. Since I last saw her she has done well.  The patient denies any new symptoms such as chest discomfort, neck or arm discomfort. There has been no new shortness of breath, PND or orthopnea. There have been no reported palpitations, presyncope or syncope.  He does rarely get dyspnea when he climbs up the hill from his mailbox but this is unchanged.   Allergies  Allergen Reactions  . Penicillins Other (See Comments)    unknown  . Pneumococcal Vaccines     Pt states had pneumococcal vaccine at [facility] on 09-06-13 and arm swelled "huge with a lot swelling"     Current Outpatient Prescriptions  Medication Sig Dispense Refill  . acetaminophen (TYLENOL) 500 MG tablet Take 500-1,000 mg by mouth every 6 (six) hours as needed for pain.      Marland Kitchen amLODipine (NORVASC) 10 MG tablet Take 1 tablet (10 mg total) by mouth daily.  30 tablet  11  . ciprofloxacin (CIPRO) 500 MG tablet 500 mg 2 (two) times daily.       . citalopram (CELEXA) 20 MG tablet Take 1 tablet (20 mg total) by mouth daily.  30 tablet  11  . cyanocobalamin (,VITAMIN B-12,) 1000 MCG/ML injection Inject one ML IM daily for a week and then weekly for 4 weeks then monthly.  10 mL  3  . meloxicam (MOBIC) 15 MG tablet Take 1 tablet (15 mg total) by mouth daily.  30 tablet  11   Current Facility-Administered Medications  Medication Dose Route Frequency Provider Last Rate Last Dose  . cyanocobalamin ((VITAMIN B-12)) injection 1,000 mcg  1,000 mcg Intramuscular Q30 days Deatra Canter, FNP   1,000 mcg at 07/14/14 0837    ROS:  As stated in the HPI and negative for all other systems.  PHYSICAL EXAM BP 140/78  Pulse 91  Ht 5\' 9"  (1.753 m)  Wt 187 lb (84.823 kg)  BMI 27.60 kg/m2 GENERAL:  Well appearing NECK:  No jugular venous distention,  waveform within normal limits, carotid upstroke brisk and symmetric, no bruits, no thyromegaly LUNGS:  Clear to auscultation bilaterally CHEST:  Unremarkable HEART:  PMI not displaced or sustained,S1 and S2 within normal limits, no S3, no S4, no clicks, no rubs, soft apical systolic murmur early peaking, no diastolic murmurs ABD:  Flat, positive bowel sounds normal in frequency in pitch, no bruits, no rebound, no guarding, no midline pulsatile mass, no hepatomegaly, no splenomegaly EXT:  2 plus pulses throughout, no edema, no cyanosis no clubbing  EKG:  Sinus rhythm, rate 86, axis within normal limits, intervals within normal limits, no acute ST-T wave changes.  07/23/2014   ASSESSMENT AND PLAN  CHEST PAIN:  The patient has no new sypmtoms.  No further cardiovascular testing is indicated.    HTN:  The blood pressure is at target. No change in medications is indicated. We will continue with therapeutic lifestyle changes (TLC).  CAROTID STENOSIS:  This was mild been no further imaging is indicated.  MURMUR:  This might represent some dynamic outflow obstruction but it is very mild. I actually don't hear any change with Valsalva but there was some comment that this might have been evident on the 2010 oh. I reviewed these results. I don't think it represents any significant obstruction or mitral regurgitation. We can follow  it clinically.

## 2014-08-15 ENCOUNTER — Ambulatory Visit (INDEPENDENT_AMBULATORY_CARE_PROVIDER_SITE_OTHER): Payer: Medicare HMO

## 2014-08-15 DIAGNOSIS — E538 Deficiency of other specified B group vitamins: Secondary | ICD-10-CM

## 2014-09-15 ENCOUNTER — Ambulatory Visit: Payer: Medicare HMO

## 2014-09-17 ENCOUNTER — Encounter: Payer: Self-pay | Admitting: Family Medicine

## 2014-09-17 ENCOUNTER — Ambulatory Visit (INDEPENDENT_AMBULATORY_CARE_PROVIDER_SITE_OTHER): Payer: Medicare HMO | Admitting: Family Medicine

## 2014-09-17 VITALS — BP 135/76 | HR 82 | Temp 97.0°F | Ht 69.0 in | Wt 191.0 lb

## 2014-09-17 DIAGNOSIS — Z23 Encounter for immunization: Secondary | ICD-10-CM

## 2014-09-17 DIAGNOSIS — E538 Deficiency of other specified B group vitamins: Secondary | ICD-10-CM

## 2014-09-17 DIAGNOSIS — I10 Essential (primary) hypertension: Secondary | ICD-10-CM

## 2014-09-17 NOTE — Progress Notes (Signed)
Subjective:    Patient ID: Casey Klein, male    DOB: 04/23/1941, 72 y.o.   MRN: [id number]  HPI This 72 y.o. male presents for evaluation of routine follow up. He has hx of hypertension, CAD, and b12 deficiency.  He has no acute complaints.   Review of Systems    No chest pain, SOB, HA, dizziness, vision change, N/V, diarrhea, constipation, dysuria, urinary urgency or frequency, myalgias, arthralgias or rash.  Objective:   Physical Exam Vital signs noted  Well developed well nourished male.  HEENT - Head atraumatic Normocephalic                Eyes - PERRLA, Conjuctiva - clear Sclera- Clear EOMI                Ears - EAC's Wnl TM's Wnl Gross Hearing WNL                Nose - Nares patent                 Throat - oropharanx wnl Respiratory - Lungs CTA bilateral Cardiac - RRR S1 and S2 without murmur GI - Abdomen soft Nontender and bowel sounds active x 4 Extremities - No edema. Neuro - Grossly intact.       Assessment & Plan:  B12 deficiency - Plan: Vitamin B12  Essential hypertension - Plan: POCT CBC, CMP14+EGFR  Encounter for immunization  Follow up in 3 months  William J Oxford FNP 

## 2014-10-20 ENCOUNTER — Ambulatory Visit (INDEPENDENT_AMBULATORY_CARE_PROVIDER_SITE_OTHER): Payer: Medicare HMO | Admitting: *Deleted

## 2014-10-20 DIAGNOSIS — E538 Deficiency of other specified B group vitamins: Secondary | ICD-10-CM

## 2014-10-20 NOTE — Patient Instructions (Signed)

## 2014-10-20 NOTE — Progress Notes (Signed)
Patient ID: Casey Klein, male   DOB: 1941-02-25, 73 y.o.   MRN: [id number] Pt given B12 injection IM left deltoid, pt tolerated injection well.

## 2014-10-27 ENCOUNTER — Encounter: Payer: Self-pay | Admitting: *Deleted

## 2014-11-20 ENCOUNTER — Ambulatory Visit (INDEPENDENT_AMBULATORY_CARE_PROVIDER_SITE_OTHER): Payer: Medicare HMO | Admitting: *Deleted

## 2014-11-20 DIAGNOSIS — E538 Deficiency of other specified B group vitamins: Secondary | ICD-10-CM

## 2014-11-20 NOTE — Progress Notes (Signed)
Pt given b12 injection IM right deltoid, pt tolerated injection well. 

## 2014-11-20 NOTE — Patient Instructions (Signed)

## 2014-12-22 ENCOUNTER — Ambulatory Visit (INDEPENDENT_AMBULATORY_CARE_PROVIDER_SITE_OTHER): Payer: Medicare HMO | Admitting: *Deleted

## 2014-12-22 DIAGNOSIS — E538 Deficiency of other specified B group vitamins: Secondary | ICD-10-CM

## 2014-12-22 NOTE — Progress Notes (Signed)
Vitamin b12 injection given and tolerated well.  

## 2014-12-22 NOTE — Patient Instructions (Signed)

## 2015-01-19 ENCOUNTER — Ambulatory Visit: Payer: Medicare HMO | Admitting: Family

## 2015-01-19 ENCOUNTER — Ambulatory Visit: Payer: Medicare HMO | Admitting: Family Medicine

## 2015-01-23 ENCOUNTER — Ambulatory Visit: Payer: Medicare HMO

## 2015-01-23 ENCOUNTER — Ambulatory Visit (INDEPENDENT_AMBULATORY_CARE_PROVIDER_SITE_OTHER): Payer: Commercial Managed Care - HMO | Admitting: *Deleted

## 2015-01-23 DIAGNOSIS — E538 Deficiency of other specified B group vitamins: Secondary | ICD-10-CM

## 2015-01-23 NOTE — Progress Notes (Signed)
Pt given B12 injection IM right deltoid, pt tolerated injection well. 

## 2015-01-23 NOTE — Patient Instructions (Signed)

## 2015-01-26 ENCOUNTER — Encounter: Payer: Self-pay | Admitting: Family

## 2015-01-26 ENCOUNTER — Ambulatory Visit (INDEPENDENT_AMBULATORY_CARE_PROVIDER_SITE_OTHER): Payer: Commercial Managed Care - HMO | Admitting: Family

## 2015-01-26 VITALS — BP 138/77 | HR 82 | Temp 97.0°F | Ht 69.0 in | Wt 197.6 lb

## 2015-01-26 DIAGNOSIS — I10 Essential (primary) hypertension: Secondary | ICD-10-CM | POA: Diagnosis not present

## 2015-01-26 DIAGNOSIS — Z1321 Encounter for screening for nutritional disorder: Secondary | ICD-10-CM

## 2015-01-26 DIAGNOSIS — J4520 Mild intermittent asthma, uncomplicated: Secondary | ICD-10-CM

## 2015-01-26 DIAGNOSIS — F329 Major depressive disorder, single episode, unspecified: Secondary | ICD-10-CM

## 2015-01-26 DIAGNOSIS — F32A Depression, unspecified: Secondary | ICD-10-CM

## 2015-01-26 MED ORDER — BUDESONIDE-FORMOTEROL FUMARATE 80-4.5 MCG/ACT IN AERO: 2.0000 | INHALATION_SPRAY | Freq: Two times a day (BID) | RESPIRATORY_TRACT | Status: DC

## 2015-01-26 NOTE — Patient Instructions (Addendum)
Health Maintenance A healthy lifestyle and preventative care can promote health and wellness.  Maintain regular health, dental, and eye exams.  Eat a healthy diet. Foods like vegetables, fruits, whole grains, low-fat dairy products, and lean protein foods contain the nutrients you need and are low in calories. Decrease your intake of foods high in solid fats, added sugars, and salt. Get information about a proper diet from your health care provider, if necessary.  Regular physical exercise is one of the most important things you can do for your health. Most adults should get at least 150 minutes of moderate-intensity exercise (any activity that increases your heart rate and causes you to sweat) each week. In addition, most adults need muscle-strengthening exercises on 2 or more days a week.   Maintain a healthy weight. The body mass index (BMI) is a screening tool to identify possible weight problems. It provides an estimate of body fat based on height and weight. Your health care provider can find your BMI and can help you achieve or maintain a healthy weight. For males 20 years and older:  A BMI below 18.5 is considered underweight.  A BMI of 18.5 to 24.9 is normal.  A BMI of 25 to 29.9 is considered overweight.  A BMI of 30 and above is considered obese.  Maintain normal blood lipids and cholesterol by exercising and minimizing your intake of saturated fat. Eat a balanced diet with plenty of fruits and vegetables. Blood tests for lipids and cholesterol should begin at age 20 and be repeated every 5 years. If your lipid or cholesterol levels are high, you are over age 50, or you are at high risk for heart disease, you may need your cholesterol levels checked more frequently.Ongoing high lipid and cholesterol levels should be treated with medicines if diet and exercise are not working.  If you smoke, find out from your health care provider how to quit. If you do not use tobacco, do not  start.  Lung cancer screening is recommended for adults aged 55-80 years who are at high risk for developing lung cancer because of a history of smoking. A yearly low-dose CT scan of the lungs is recommended for people who have at least a 30-pack-year history of smoking and are current smokers or have quit within the past 15 years. A pack year of smoking is smoking an average of 1 pack of cigarettes a day for 1 year (for example, a 30-pack-year history of smoking could mean smoking 1 pack a day for 30 years or 2 packs a day for 15 years). Yearly screening should continue until the smoker has stopped smoking for at least 15 years. Yearly screening should be stopped for people who develop a health problem that would prevent them from having lung cancer treatment.  If you choose to drink alcohol, do not have more than 2 drinks per day. One drink is considered to be 12 oz (360 mL) of beer, 5 oz (150 mL) of wine, or 1.5 oz (45 mL) of liquor.  Avoid the use of street drugs. Do not share needles with anyone. Ask for help if you need support or instructions about stopping the use of drugs.  High blood pressure causes heart disease and increases the risk of stroke. Blood pressure should be checked at least every 1-2 years. Ongoing high blood pressure should be treated with medicines if weight loss and exercise are not effective.  If you are 45-79 years old, ask your health care provider if   you should take aspirin to prevent heart disease.  Diabetes screening involves taking a blood sample to check your fasting blood sugar level. This should be done once every 3 years after age 45 if you are at a normal weight and without risk factors for diabetes. Testing should be considered at a younger age or be carried out more frequently if you are overweight and have at least 1 risk factor for diabetes.  Colorectal cancer can be detected and often prevented. Most routine colorectal cancer screening begins at the age of 50  and continues through age 75. However, your health care provider may recommend screening at an earlier age if you have risk factors for colon cancer. On a yearly basis, your health care provider may provide home test kits to check for hidden blood in the stool. A small camera at the end of a tube may be used to directly examine the colon (sigmoidoscopy or colonoscopy) to detect the earliest forms of colorectal cancer. Talk to your health care provider about this at age 50 when routine screening begins. A direct exam of the colon should be repeated every 5-10 years through age 75, unless early forms of precancerous polyps or small growths are found.  People who are at an increased risk for hepatitis B should be screened for this virus. You are considered at high risk for hepatitis B if:  You were born in a country where hepatitis B occurs often. Talk with your health care provider about which countries are considered high risk.  Your parents were born in a high-risk country and you have not received a shot to protect against hepatitis B (hepatitis B vaccine).  You have HIV or AIDS.  You use needles to inject street drugs.  You live with, or have sex with, someone who has hepatitis B.  You are a man who has sex with other men (MSM).  You get hemodialysis treatment.  You take certain medicines for conditions like cancer, organ transplantation, and autoimmune conditions.  Hepatitis C blood testing is recommended for all people born from 1945 through 1965 and any individual with known risk factors for hepatitis C.  Healthy men should no longer receive prostate-specific antigen (PSA) blood tests as part of routine cancer screening. Talk to your health care provider about prostate cancer screening.  Testicular cancer screening is not recommended for adolescents or adult males who have no symptoms. Screening includes self-exam, a health care provider exam, and other screening tests. Consult with your  health care provider about any symptoms you have or any concerns you have about testicular cancer.  Practice safe sex. Use condoms and avoid high-risk sexual practices to reduce the spread of sexually transmitted infections (STIs).  You should be screened for STIs, including gonorrhea and chlamydia if:  You are sexually active and are younger than 24 years.  You are older than 24 years, and your health care provider tells you that you are at risk for this type of infection.  Your sexual activity has changed since you were last screened, and you are at an increased risk for chlamydia or gonorrhea. Ask your health care provider if you are at risk.  If you are at risk of being infected with HIV, it is recommended that you take a prescription medicine daily to prevent HIV infection. This is called pre-exposure prophylaxis (PrEP). You are considered at risk if:  You are a man who has sex with other men (MSM).  You are a heterosexual man who   is sexually active with multiple partners.  You take drugs by injection.  You are sexually active with a partner who has HIV.  Talk with your health care provider about whether you are at high risk of being infected with HIV. If you choose to begin PrEP, you should first be tested for HIV. You should then be tested every 3 months for as long as you are taking PrEP.  Use sunscreen. Apply sunscreen liberally and repeatedly throughout the day. You should seek shade when your shadow is shorter than you. Protect yourself by wearing long sleeves, pants, a wide-brimmed hat, and sunglasses year round whenever you are outdoors.  Tell your health care provider of new moles or changes in moles, especially if there is a change in shape or color. Also, tell your health care provider if a mole is larger than the size of a pencil eraser.  A one-time screening for abdominal aortic aneurysm (AAA) and surgical repair of large AAAs by ultrasound is recommended for men aged  65-75 years who are current or former smokers.  Stay current with your vaccines (immunizations). Document Released: 05/19/2008 Document Revised: 11/26/2013 Document Reviewed: 04/18/2011 [facility] Patient Information 2015 [place], [place]. This information is not intended to replace advice given to you by your health care provider. Make sure you discuss any questions you have with your health care provider. Asthma Asthma is a recurring condition in which the airways tighten and narrow. Asthma can make it difficult to breathe. It can cause coughing, wheezing, and shortness of breath. Asthma episodes, also called asthma attacks, range from minor to life-threatening. Asthma cannot be cured, but medicines and lifestyle changes can help control it. CAUSES Asthma is believed to be caused by inherited (genetic) and environmental factors, but its exact cause is unknown. Asthma may be triggered by allergens, lung infections, or irritants in the air. Asthma triggers are different for each person. Common triggers include:   Animal dander.  Dust mites.  Cockroaches.  Pollen from trees or grass.  Mold.  Smoke.  Air pollutants such as dust, household cleaners, hair sprays, aerosol sprays, paint fumes, strong chemicals, or strong odors.  Cold air, weather changes, and winds (which increase molds and pollens in the air).  Strong emotional expressions such as crying or laughing hard.  Stress.  Certain medicines (such as aspirin) or types of drugs (such as beta-blockers).  Sulfites in foods and drinks. Foods and drinks that may contain sulfites include dried fruit, potato chips, and sparkling grape juice.  Infections or inflammatory conditions such as the flu, a cold, or an inflammation of the nasal membranes (rhinitis).  Gastroesophageal reflux disease (GERD).  Exercise or strenuous activity. SYMPTOMS Symptoms may occur immediately after asthma is triggered or many hours later. Symptoms  include:  Wheezing.  Excessive nighttime or early morning coughing.  Frequent or severe coughing with a common cold.  Chest tightness.  Shortness of breath. DIAGNOSIS  The diagnosis of asthma is made by a review of your medical history and a physical exam. Tests may also be performed. These may include:  Lung function studies. These tests show how much air you breathe in and out.  Allergy tests.  Imaging tests such as X-rays. TREATMENT  Asthma cannot be cured, but it can usually be controlled. Treatment involves identifying and avoiding your asthma triggers. It also involves medicines. There are 2 classes of medicine used for asthma treatment:   Controller medicines. These prevent asthma symptoms from occurring. They are usually taken every day.  Reliever or rescue medicines. These quickly relieve asthma symptoms. They are used as needed and provide short-term relief. Your health care provider will help you create an asthma action plan. An asthma action plan is a written plan for managing and treating your asthma attacks. It includes a list of your asthma triggers and how they may be avoided. It also includes information on when medicines should be taken and when their dosage should be changed. An action plan may also involve the use of a device called a peak flow meter. A peak flow meter measures how well the lungs are working. It helps you monitor your condition. HOME CARE INSTRUCTIONS   Take medicines only as directed by your health care provider. Speak with your health care provider if you have questions about how or when to take the medicines.  Use a peak flow meter as directed by your health care provider. Record and keep track of readings.  Understand and use the action plan to help minimize or stop an asthma attack without needing to seek medical care.  Control your home environment in the following ways to help prevent asthma attacks:  Do not smoke. Avoid being exposed to  secondhand smoke.  Change your heating and air conditioning filter regularly.  Limit your use of fireplaces and wood stoves.  Get rid of pests (such as roaches and mice) and their droppings.  Throw away plants if you see mold on them.  Clean your floors and dust regularly. Use unscented cleaning products.  Try to have someone else vacuum for you regularly. Stay out of rooms while they are being vacuumed and for a short while afterward. If you vacuum, use a dust mask from a hardware store, a double-layered or microfilter vacuum cleaner bag, or a vacuum cleaner with a HEPA filter.  Replace carpet with wood, tile, or vinyl flooring. Carpet can trap dander and dust.  Use allergy-proof pillows, mattress covers, and box spring covers.  Wash bed sheets and blankets every week in hot water and dry them in a dryer.  Use blankets that are made of polyester or cotton.  Clean bathrooms and kitchens with bleach. If possible, have someone repaint the walls in these rooms with mold-resistant paint. Keep out of the rooms that are being cleaned and painted.  Wash hands frequently. SEEK MEDICAL CARE IF:   You have wheezing, shortness of breath, or a cough even if taking medicine to prevent attacks.  The colored mucus you cough up (sputum) is thicker than usual.  Your sputum changes from clear or white to yellow, green, gray, or bloody.  You have any problems that may be related to the medicines you are taking (such as a rash, itching, swelling, or trouble breathing).  You are using a reliever medicine more than 2-3 times per week.  Your peak flow is still at 50-79% of your personal best after following your action plan for 1 hour.  You have a fever. SEEK IMMEDIATE MEDICAL CARE IF:   You seem to be getting worse and are unresponsive to treatment during an asthma attack.  You are short of breath even at rest.  You get short of breath when doing very little physical activity.  You have  difficulty eating, drinking, or talking due to asthma symptoms.  You develop chest pain.  You develop a fast heartbeat.  You have a bluish color to your lips or fingernails.  You are light-headed, dizzy, or faint.  Your peak flow is less than 50%  of your personal best. MAKE SURE YOU:   Understand these instructions.  Will watch your condition.  Will get help right away if you are not doing well or get worse. Document Released: 11/21/2005 Document Revised: 04/07/2014 Document Reviewed: 06/20/2013 [facility] Patient Information 2015 [place]. This information is not intended to replace advice given to you by your health care provider. Make sure you discuss any questions you have with your health care provider.

## 2015-01-26 NOTE — Progress Notes (Signed)
Subjective:    Patient ID: Casey Klein, male    DOB: Oct 21, 1941, 74 y.o.   MRN: [id number]  Hypertension This is a chronic problem. The current episode started more than 1 year ago. The problem has been waxing and waning since onset. The problem is uncontrolled. Associated symptoms include shortness of breath ("at times:). Pertinent negatives include no anxiety, headaches, malaise/fatigue, palpitations, peripheral edema or sweats. Risk factors for coronary artery disease include male gender. Past treatments include calcium channel blockers. The current treatment provides mild improvement. There is no history of kidney disease, CAD/MI, CVA, heart failure or a thyroid problem. There is no history of sleep apnea.  Asthma He complains of shortness of breath ("at times:). There is no cough, difficulty breathing or hoarse voice. This is a chronic problem. The current episode started more than 1 year ago. The problem occurs intermittently. The problem has been waxing and waning. Pertinent negatives include no ear congestion, headaches, malaise/fatigue or sweats. His symptoms are aggravated by exercise. His symptoms are alleviated by rest. He reports moderate improvement on treatment. His past medical history is significant for asthma. There is no history of COPD.  Depression Pt currently taking celexa 20 mg daily and states he feels like his depression is well controlled.     Review of Systems  Constitutional: Negative.  Negative for malaise/fatigue.  HENT: Negative.  Negative for hoarse voice.   Respiratory: Positive for shortness of breath ("at times:). Negative for cough.   Cardiovascular: Negative.  Negative for palpitations.  Gastrointestinal: Negative.   Endocrine: Negative.   Genitourinary: Negative.   Musculoskeletal: Negative.   Neurological: Negative.  Negative for headaches.  Hematological: Negative.   Psychiatric/Behavioral: Negative.   All other systems reviewed and are  negative.      Objective:   Physical Exam  Constitutional: He is oriented to person, place, and time. He appears well-developed and well-nourished. No distress.  HENT:  Head: Normocephalic.  Right Ear: External ear normal.  Left Ear: External ear normal.  Mouth/Throat: Oropharynx is clear and moist.  Eyes: Pupils are equal, round, and reactive to light. Right eye exhibits no discharge. Left eye exhibits no discharge.  Neck: Normal range of motion. Neck supple. No thyromegaly present.  Cardiovascular: Normal rate, regular rhythm, normal heart sounds and intact distal pulses.   No murmur heard. Pulmonary/Chest: Effort normal and breath sounds normal. No respiratory distress. He has no wheezes.  Abdominal: Soft. Bowel sounds are normal. He exhibits no distension. There is no tenderness.  Musculoskeletal: Normal range of motion. He exhibits no edema or tenderness.  Neurological: He is alert and oriented to person, place, and time. He has normal reflexes. No cranial nerve deficit.  Skin: Skin is warm and dry. No rash noted. No erythema.  Psychiatric: He has a normal mood and affect. His behavior is normal. Judgment and thought content normal.  Vitals reviewed.   Blood pressure 138/77, pulse 82, temperature 97 F (36.1 C), temperature source Oral, height $RemoveBefo'5\' 9"'[id number]$  (1.753 m), weight 197 lb 9.6 oz (89.631 kg).       Assessment & Plan:  1. Essential hypertension, benign - CMP14+EGFR  2. Asthma, mild intermittent, uncomplicated -Pt started on symbicort today to help breathing- Pt given sample  - budesonide-formoterol (SYMBICORT) 80-4.5 MCG/ACT inhaler; Inhale 2 puffs into the lungs 2 (two) times daily.  Dispense: 1 Inhaler; Refill: 12  3. Encounter for vitamin deficiency screening - Vit D  25 hydroxy (rtn osteoporosis monitoring)  4. Depression  Continue all meds Labs pending Health Maintenance reviewed Diet and exercise encouraged RTO 6 months  Alvy Alsop, FNP   

## 2015-01-27 ENCOUNTER — Other Ambulatory Visit: Payer: Self-pay | Admitting: Family

## 2015-01-27 LAB — CMP14+EGFR
A/G RATIO: 1.9 (ref 1.1–2.5)
ALT: 23 IU/L (ref 0–44)
AST: 20 IU/L (ref 0–40)
Albumin: 4.7 g/dL (ref 3.5–4.8)
Alkaline Phosphatase: 52 IU/L (ref 39–117)
BUN / CREAT RATIO: 20 (ref 10–22)
BUN: 17 mg/dL (ref 8–27)
Bilirubin Total: 0.6 mg/dL (ref 0.0–1.2)
CO2: 22 mmol/L (ref 18–29)
Calcium: 8.9 mg/dL (ref 8.6–10.2)
Chloride: 105 mmol/L (ref 97–108)
Creatinine, Ser: 0.87 mg/dL (ref 0.76–1.27)
GFR, EST AFRICAN AMERICAN: 98 mL/min/{1.73_m2} (ref >59)
GFR, EST NON AFRICAN AMERICAN: 84 mL/min/{1.73_m2} (ref >59)
GLOBULIN, TOTAL: 2.5 g/dL (ref 1.5–4.5)
Glucose: 80 mg/dL (ref 65–99)
Potassium: 4.3 mmol/L (ref 3.5–5.2)
Sodium: 144 mmol/L (ref 134–144)
TOTAL PROTEIN: 7.2 g/dL (ref 6.0–8.5)

## 2015-01-27 LAB — VITAMIN D 25 HYDROXY (VIT D DEFICIENCY, FRACTURES): Vit D, 25-Hydroxy: 16 ng/mL — ABNORMAL LOW (ref 30.0–100.0)

## 2015-01-27 MED ORDER — VITAMIN D (ERGOCALCIFEROL) 1.25 MG (50000 UNIT) PO CAPS: 50000.0000 [IU] | ORAL_CAPSULE | ORAL | Status: DC

## 2015-02-23 ENCOUNTER — Ambulatory Visit (INDEPENDENT_AMBULATORY_CARE_PROVIDER_SITE_OTHER): Payer: Commercial Managed Care - HMO | Admitting: *Deleted

## 2015-02-23 DIAGNOSIS — E538 Deficiency of other specified B group vitamins: Secondary | ICD-10-CM | POA: Diagnosis not present

## 2015-02-23 NOTE — Progress Notes (Signed)
Vitamin b12 given and tolerated well. 

## 2015-02-23 NOTE — Patient Instructions (Signed)

## 2015-03-27 ENCOUNTER — Ambulatory Visit (INDEPENDENT_AMBULATORY_CARE_PROVIDER_SITE_OTHER): Payer: Commercial Managed Care - HMO | Admitting: *Deleted

## 2015-03-27 DIAGNOSIS — E538 Deficiency of other specified B group vitamins: Secondary | ICD-10-CM | POA: Diagnosis not present

## 2015-03-27 NOTE — Progress Notes (Signed)
Vitamin b12 injection given and tolerated well.  

## 2015-03-27 NOTE — Patient Instructions (Signed)

## 2015-04-27 ENCOUNTER — Ambulatory Visit (INDEPENDENT_AMBULATORY_CARE_PROVIDER_SITE_OTHER): Payer: Commercial Managed Care - HMO | Admitting: *Deleted

## 2015-04-27 DIAGNOSIS — E538 Deficiency of other specified B group vitamins: Secondary | ICD-10-CM | POA: Diagnosis not present

## 2015-04-27 MED ORDER — CYANOCOBALAMIN 1000 MCG/ML IJ SOLN
1000.0000 ug | INTRAMUSCULAR | Status: AC
  Administered 2015-04-27 – 2015-10-02 (×6): 1000 ug via INTRAMUSCULAR

## 2015-04-27 NOTE — Patient Instructions (Signed)

## 2015-04-27 NOTE — Progress Notes (Signed)
Pt given B12 injection IM left deltoid and tolerated well. °

## 2015-05-03 ENCOUNTER — Other Ambulatory Visit: Payer: Self-pay | Admitting: Family Medicine

## 2015-05-29 ENCOUNTER — Ambulatory Visit (INDEPENDENT_AMBULATORY_CARE_PROVIDER_SITE_OTHER): Payer: Commercial Managed Care - HMO

## 2015-05-29 ENCOUNTER — Encounter (INDEPENDENT_AMBULATORY_CARE_PROVIDER_SITE_OTHER): Payer: Self-pay

## 2015-05-29 DIAGNOSIS — E538 Deficiency of other specified B group vitamins: Secondary | ICD-10-CM

## 2015-06-03 ENCOUNTER — Other Ambulatory Visit: Payer: Self-pay | Admitting: Family

## 2015-06-22 ENCOUNTER — Encounter: Payer: Self-pay | Admitting: *Deleted

## 2015-06-29 ENCOUNTER — Ambulatory Visit (INDEPENDENT_AMBULATORY_CARE_PROVIDER_SITE_OTHER): Payer: Commercial Managed Care - HMO | Admitting: *Deleted

## 2015-06-29 DIAGNOSIS — E538 Deficiency of other specified B group vitamins: Secondary | ICD-10-CM

## 2015-06-29 NOTE — Progress Notes (Signed)
Patient tolerated well.

## 2015-06-29 NOTE — Patient Instructions (Signed)
Vitamin B12 Injections Every person needs vitamin B12. A deficiency develops when the body does not get enough of it. One way to overcome this is by getting B12 shots (injections). A B12 shot puts the vitamin directly into muscle tissue. This avoids any problems your body might have in absorbing it from food or a pill. In some people, the body has trouble using the vitamin correctly. This can cause a B12 deficiency. Not consuming enough of the vitamin can also cause a deficiency. Getting enough vitamin B12 can be hard for elderly people. Sometimes, they do not eat a well-balanced diet. The elderly are also more likely than younger people to have medical conditions or take medications that can lead to a deficiency. WHAT DOES VITAMIN B12 DO? Vitamin B12 does many things to help the body work right:  It helps the body make healthy red blood cells.  It helps maintain nerve cells.  It is involved in the body's process of converting food into energy (metabolism).  It is needed to make the genetic material in all cells (DNA). VITAMIN B12 FOOD SOURCES Most people get plenty of vitamin B12 through the foods they eat. It is present in:  Meat, fish, poultry, and eggs.  Milk and milk products.  It also is added when certain foods are made, including some breads, cereals and yogurts. The food is then called "fortified". CAUSES The most common causes of vitamin B12 deficiency are:  Pernicious anemia. The condition develops when the body cannot make enough healthy red blood cells. This stems from a lack of a protein made in the stomach (intrinsic factor). People without this protein cannot absorb enough vitamin B12 from food.  Malabsorption. This is when the body cannot absorb the vitamin. It can be caused by:  Pernicious anemia.  Surgery to remove part or all of the stomach can lead to malabsorption. Removal of part or all of the small intestine can also cause malabsorption.  Vegetarian diet.  People who are strict about not eating foods from animals could have trouble taking in enough vitamin B12 from diet alone.  Medications. Some medicines have been linked to B12 deficiency, such as Metformin (a drug prescribed for type 2 diabetes). Long-term use of stomach acid suppressants also can keep the vitamin from being absorbed.  Intestinal problems such as inflammatory bowel disease. If there are problems in the digestive tract, vitamin B12 may not be absorbed in good enough amounts. SYMPTOMS People who do not get enough B12 can develop problems. These can include:  Anemia. This is when the body has too few red blood cells. Red blood cells carry oxygen to the rest of the body. Without a healthy supply of red blood cells, people can feel:  Tired (fatigued).  Weak.  Severe anemia can cause:  Shortness of breath.  Dizziness.  Rapid heart rate.  Paleness.  Other Vitamin B12 deficiency symptoms include:  Diarrhea.  Numbness or tingling in the hands or feet.  Loss of appetite.  Confusion.  Sores on the tongue or in the mouth. LET YOUR CAREGIVER KNOW ABOUT:  Any allergies. It is very important to know if you are allergic or sensitive to cobalt. Vitamin B12 contains cobalt.  Any history of kidney disease.  All medications you are taking. Include prescription and over-the-counter medicines, herbs and creams.  Whether you are pregnant or breast-feeding.  If you have Leber's disease, a hereditary eye condition, vitamin B12 could make it worse. RISKS AND COMPLICATIONS Reactions to an injection are   usually temporary. They might include:  Pain at the injection site.  Redness, swelling or tenderness at the site.  Headache, dizziness or weakness.  Nausea, upset stomach or diarrhea.  Numbness or tingling.  Fever.  Joint pain.  Itching or rash. If a reaction does not go away in a short while, talk with your healthcare provider. A change in the way the shots are  given, or where they are given, might need to be made. BEFORE AN INJECTION To decide whether B12 injections are right for you, your healthcare provider will probably:  Ask about your medical history.  Ask questions about your diet.  Ask about symptoms such as:  Have you felt weak?  Do you feel unusually tired?  Do you get dizzy?  Order blood tests. These may include a test to:  Check the level of red cells in your blood.  Measure B12 levels.  Check for the presence of intrinsic factor. VITAMIN B12 INJECTIONS How often you will need a vitamin B12 injection will depend on how severe your deficiency is. This also will affect how long you will need to get them. People with pernicious anemia usually get injections for their entire life. Others might get them for a shorter period. For many people, injections are given daily or weekly for several weeks. Then, once B12 levels are normal, injections are given just once a month. If the cause of the deficiency can be fixed, the injections can be stopped. Talk with your healthcare provider about what you should expect. For an injection:  The injection site will be cleaned with an alcohol swab.  Your healthcare provider will insert a needle directly into a muscle. Most any muscle can be used. Most often, an arm muscle is used. A buttocks muscle can also be used. Many people say shots in that area are less painful.  A small adhesive bandage may be put over the injection site. It usually can be taken off in an hour or less. Injections can be given by your healthcare provider. In some cases, family members give them. Sometimes, people give them to themselves. Talk with your healthcare provider about what would be best for you. If someone other than your healthcare provider will be giving the shots, the person will need to be trained to give them correctly. HOME CARE INSTRUCTIONS   You can remove the adhesive bandage within an hour of getting a  shot.  You should be able to go about your normal activities right away.  Avoid drinking large amounts of alcohol while taking vitamin B12 shots. Alcohol can interfere with the body's use of the vitamin. SEEK MEDICAL CARE IF:   Pain, redness, swelling or tenderness at the injection site does not get better or gets worse.  Headache, dizziness or weakness does not go away.  You develop a fever of more than 100.5 F (38.1 C). SEEK IMMEDIATE MEDICAL CARE IF:   You have chest pain.  You develop shortness of breath.  You have muscle weakness that gets worse.  You develop numbness, weakness or tingling on one side or one area of the body.  You have symptoms of an allergic reaction, such as:  Hives.  Difficulty breathing.  Swelling of the lips, face, tongue or throat.  You develop a fever of more than 102.0 F (38.9 C). MAKE SURE YOU:   Understand these instructions.  Will watch your condition.  Will get help right away if you are not doing well or get worse. Document   Released: 02/17/2009 Document Revised: 02/13/2012 Document Reviewed: 02/17/2009 [employer] Patient Information 2015 [employer]. This information is not intended to replace advice given to you by your health care provider. Make sure you discuss any questions you have with your health care provider.  

## 2015-07-01 ENCOUNTER — Other Ambulatory Visit: Payer: Self-pay | Admitting: Family

## 2015-07-27 ENCOUNTER — Ambulatory Visit (INDEPENDENT_AMBULATORY_CARE_PROVIDER_SITE_OTHER): Payer: Commercial Managed Care - HMO | Admitting: Family

## 2015-07-27 ENCOUNTER — Encounter: Payer: Self-pay | Admitting: Family

## 2015-07-27 VITALS — BP 145/97 | HR 84 | Temp 96.0°F | Ht 69.0 in | Wt 196.8 lb

## 2015-07-27 DIAGNOSIS — F329 Major depressive disorder, single episode, unspecified: Secondary | ICD-10-CM | POA: Diagnosis not present

## 2015-07-27 DIAGNOSIS — F32A Depression, unspecified: Secondary | ICD-10-CM

## 2015-07-27 DIAGNOSIS — I2510 Atherosclerotic heart disease of native coronary artery without angina pectoris: Secondary | ICD-10-CM | POA: Diagnosis not present

## 2015-07-27 DIAGNOSIS — J4520 Mild intermittent asthma, uncomplicated: Secondary | ICD-10-CM

## 2015-07-27 DIAGNOSIS — M1990 Unspecified osteoarthritis, unspecified site: Secondary | ICD-10-CM | POA: Diagnosis not present

## 2015-07-27 DIAGNOSIS — E559 Vitamin D deficiency, unspecified: Secondary | ICD-10-CM | POA: Diagnosis not present

## 2015-07-27 DIAGNOSIS — I10 Essential (primary) hypertension: Secondary | ICD-10-CM | POA: Diagnosis not present

## 2015-07-27 DIAGNOSIS — F411 Generalized anxiety disorder: Secondary | ICD-10-CM | POA: Insufficient documentation

## 2015-07-27 MED ORDER — CITALOPRAM HYDROBROMIDE 20 MG PO TABS: ORAL_TABLET | ORAL | Status: DC

## 2015-07-27 MED ORDER — AMLODIPINE BESYLATE 10 MG PO TABS: ORAL_TABLET | ORAL | Status: DC

## 2015-07-27 MED ORDER — MELOXICAM 15 MG PO TABS: ORAL_TABLET | ORAL | Status: DC

## 2015-07-27 MED ORDER — MONTELUKAST SODIUM 10 MG PO TABS: 10.0000 mg | ORAL_TABLET | Freq: Every day | ORAL | Status: DC

## 2015-07-27 NOTE — Patient Instructions (Signed)

## 2015-07-27 NOTE — Progress Notes (Signed)
Subjective:    Patient ID: Casey Klein, male    DOB: [birth year], 74 y.o.   MRN: [id number]  Pt presents to the office today for chronic follow up.  Coronary Artery Disease Presents for follow-up visit. Symptoms include shortness of breath ("at times:). Pertinent negatives include no chest pain, dizziness or palpitations. Risk factors include hypertension.  Hypertension This is a chronic problem. The current episode started more than 1 year ago. The problem has been waxing and waning since onset. The problem is uncontrolled. Associated symptoms include shortness of breath ("at times:). Pertinent negatives include no anxiety, chest pain, headaches, malaise/fatigue, palpitations, peripheral edema or sweats. Risk factors for coronary artery disease include male gender. Past treatments include calcium channel blockers. The current treatment provides mild improvement. Hypertensive end-organ damage includes CAD/MI. There is no history of kidney disease, CVA, heart failure or a thyroid problem. There is no history of sleep apnea.  Asthma He complains of shortness of breath ("at times:) and wheezing. There is no cough, difficulty breathing or hoarse voice. This is a chronic problem. The current episode started more than 1 year ago. The problem occurs intermittently. The problem has been waxing and waning. Pertinent negatives include no chest pain, ear congestion, headaches, malaise/fatigue or sweats. His symptoms are aggravated by exercise. His symptoms are alleviated by rest. He reports moderate improvement on treatment. His past medical history is significant for asthma. There is no history of COPD.  Anxiety Presents for follow-up visit. Symptoms include depressed mood, excessive worry, nervous/anxious behavior and shortness of breath ("at times:). Patient reports no chest pain, dizziness or palpitations. Symptoms occur rarely.   His past medical history is significant for asthma and CAD. Past  treatments include SSRIs. Compliance with prior treatments has been good.  Arthritis Presents for follow-up visit. He complains of pain and stiffness. Affected locations include the left hip and left ankle. Associated symptoms include rash. His pertinent risk factors include overuse. Past treatments include NSAIDs. The treatment provided mild relief.      Review of Systems  Constitutional: Negative.  Negative for malaise/fatigue.  HENT: Negative.  Negative for hoarse voice.   Respiratory: Positive for shortness of breath ("at times:) and wheezing. Negative for cough.   Cardiovascular: Negative.  Negative for chest pain and palpitations.  Gastrointestinal: Negative.   Endocrine: Negative.   Genitourinary: Negative.   Musculoskeletal: Positive for arthritis and stiffness.  Skin: Positive for rash.  Neurological: Negative.  Negative for dizziness and headaches.  Hematological: Negative.   Psychiatric/Behavioral: The patient is nervous/anxious.   All other systems reviewed and are negative.      Objective:   Physical Exam  Constitutional: He is oriented to person, place, and time. He appears well-developed and well-nourished. No distress.  HENT:  Head: Normocephalic.  Right Ear: External ear normal.  Left Ear: External ear normal.  Nose: Nose normal.  Mouth/Throat: Oropharynx is clear and moist.  Eyes: Pupils are equal, round, and reactive to light. Right eye exhibits no discharge. Left eye exhibits no discharge.  Neck: Normal range of motion. Neck supple. No thyromegaly present.  Cardiovascular: Normal rate, regular rhythm, normal heart sounds and intact distal pulses.   No murmur heard. Pulmonary/Chest: Effort normal and breath sounds normal. No respiratory distress. He has no wheezes.  Abdominal: Soft. Bowel sounds are normal. He exhibits no distension. There is no tenderness.  Musculoskeletal: Normal range of motion. He exhibits no edema or tenderness.  Neurological: He is  alert and oriented to person,  place, and time. He has normal reflexes. No cranial nerve deficit.  Skin: Skin is warm and dry. No rash noted. No erythema.  Psychiatric: He has a normal mood and affect. His behavior is normal. Judgment and thought content normal.  Vitals reviewed.   BP 150/93 mmHg  Pulse 84  Temp(Src) 96 F (35.6 C) (Oral)  Ht 5' 9"  (1.753 m)  Wt 196 lb 12.8 oz (89.268 kg)  BMI 29.05 kg/m2       Assessment & Plan:  1. Depression - citalopram (CELEXA) 20 MG tablet; TAKE 1 TABLET (20 MG TOTAL) BY MOUTH DAILY.  Dispense: 90 tablet; Refill: 3 - CMP14+EGFR  2. Asthma, mild intermittent, uncomplicated Pt started on Singulair today  -Encouraged pt to wear mask while doing yard work - CMP14+EGFR - montelukast (SINGULAIR) 10 MG tablet; Take 1 tablet (10 mg total) by mouth at bedtime.  Dispense: 90 tablet; Refill: 3  3. Coronary artery disease involving native coronary artery of native heart without angina pectoris - CMP14+EGFR  4. Essential hypertension, benign - amLODipine (NORVASC) 10 MG tablet; TAKE 1 TABLET (10 MG TOTAL) BY MOUTH DAILY.  Dispense: 90 tablet; Refill: 3 - CMP14+EGFR  5. GAD (generalized anxiety disorder) - citalopram (CELEXA) 20 MG tablet; TAKE 1 TABLET (20 MG TOTAL) BY MOUTH DAILY.  Dispense: 90 tablet; Refill: 3 - CMP14+EGFR  6. Vitamin D deficiency - CMP14+EGFR - Vit D  25 hydroxy (rtn osteoporosis monitoring)  7. Arthritis - meloxicam (MOBIC) 15 MG tablet; TAKE 1 TABLET (15 MG TOTAL) BY MOUTH DAILY.  Dispense: 90 tablet; Refill: 1 - CMP14+EGFR   Continue all meds Labs pending Health Maintenance reviewed Diet and exercise encouraged RTO 6 months  Evelina Dun, FNP

## 2015-07-31 ENCOUNTER — Ambulatory Visit (INDEPENDENT_AMBULATORY_CARE_PROVIDER_SITE_OTHER): Payer: Commercial Managed Care - HMO

## 2015-07-31 DIAGNOSIS — E538 Deficiency of other specified B group vitamins: Secondary | ICD-10-CM

## 2015-08-03 ENCOUNTER — Other Ambulatory Visit: Payer: Self-pay | Admitting: Family

## 2015-08-14 ENCOUNTER — Ambulatory Visit (INDEPENDENT_AMBULATORY_CARE_PROVIDER_SITE_OTHER): Payer: Commercial Managed Care - HMO | Admitting: Family Medicine

## 2015-08-14 ENCOUNTER — Encounter: Payer: Self-pay | Admitting: Family Medicine

## 2015-08-14 VITALS — BP 139/87 | HR 83 | Temp 98.1°F | Ht 69.0 in | Wt 194.0 lb

## 2015-08-14 DIAGNOSIS — I10 Essential (primary) hypertension: Secondary | ICD-10-CM

## 2015-08-14 DIAGNOSIS — Z0000 Encounter for general adult medical examination without abnormal findings: Secondary | ICD-10-CM | POA: Diagnosis not present

## 2015-08-14 DIAGNOSIS — Z9181 History of falling: Secondary | ICD-10-CM | POA: Insufficient documentation

## 2015-08-14 DIAGNOSIS — E559 Vitamin D deficiency, unspecified: Secondary | ICD-10-CM

## 2015-08-14 NOTE — Assessment & Plan Note (Signed)
Patient is at increased risk for falls and has fallen in the past year twice that he admits to. He does not feel like he wants any home health aide and physical therapy. We did provide a list of places that he could go for exercising to help with this. He is able to ambulate without issues in the room he just says he gets occasional vertigo.

## 2015-08-14 NOTE — Patient Instructions (Signed)
Fall Prevention and Home Safety Falls cause injuries and can affect all age groups. It is possible to use preventive measures to significantly decrease the likelihood of falls. There are many simple measures which can make your home safer and prevent falls. OUTDOORS  Repair cracks and edges of walkways and driveways.  Remove high doorway thresholds.  Trim shrubbery on the main path into your home.  Have good outside lighting.  Clear walkways of tools, rocks, debris, and clutter.  Check that handrails are not broken and are securely fastened. Both sides of steps should have handrails.  Have leaves, snow, and ice cleared regularly.  Use sand or salt on walkways during winter months.  In the garage, clean up grease or oil spills. BATHROOM  Install night lights.  Install grab bars by the toilet and in the tub and shower.  Use non-skid mats or decals in the tub or shower.  Place a plastic non-slip stool in the shower to sit on, if needed.  Keep floors dry and clean up all water on the floor immediately.  Remove soap buildup in the tub or shower on a regular basis.  Secure bath mats with non-slip, double-sided rug tape.  Remove throw rugs and tripping hazards from the floors. BEDROOMS  Install night lights.  Make sure a bedside light is easy to reach.  Do not use oversized bedding.  Keep a telephone by your bedside.  Have a firm chair with side arms to use for getting dressed.  Remove throw rugs and tripping hazards from the floor. KITCHEN  Keep handles on pots and pans turned toward the center of the stove. Use back burners when possible.  Clean up spills quickly and allow time for drying.  Avoid walking on wet floors.  Avoid hot utensils and knives.  Position shelves so they are not too high or low.  Place commonly used objects within easy reach.  If necessary, use a sturdy step stool with a grab bar when reaching.  Keep electrical cables out of the  way.  Do not use floor polish or wax that makes floors slippery. If you must use wax, use non-skid floor wax.  Remove throw rugs and tripping hazards from the floor. STAIRWAYS  Never leave objects on stairs.  Place handrails on both sides of stairways and use them. Fix any loose handrails. Make sure handrails on both sides of the stairways are as long as the stairs.  Check carpeting to make sure it is firmly attached along stairs. Make repairs to worn or loose carpet promptly.  Avoid placing throw rugs at the top or bottom of stairways, or properly secure the rug with carpet tape to prevent slippage. Get rid of throw rugs, if possible.  Have an electrician put in a light switch at the top and bottom of the stairs. OTHER FALL PREVENTION TIPS  Wear low-heel or rubber-soled shoes that are supportive and fit well. Wear closed toe shoes.  When using a stepladder, make sure it is fully opened and both spreaders are firmly locked. Do not climb a closed stepladder.  Add color or contrast paint or tape to grab bars and handrails in your home. Place contrasting color strips on first and last steps.  Learn and use mobility aids as needed. Install an electrical emergency response system.  Turn on lights to avoid dark areas. Replace light bulbs that burn out immediately. Get light switches that glow.  Arrange furniture to create clear pathways. Keep furniture in the same place.    Firmly attach carpet with non-skid or double-sided tape.  Eliminate uneven floor surfaces.  Select a carpet pattern that does not visually hide the edge of steps.  Be aware of all pets. OTHER HOME SAFETY TIPS  Set the water temperature for 120 F (48.8 C).  Keep emergency numbers on or near the telephone.  Keep smoke detectors on every level of the home and near sleeping areas. Document Released: 11/11/2002 Document Revised: 05/22/2012 Document Reviewed: 02/10/2012 [facility] Patient Information 2015  [place], [place]. This information is not intended to replace advice given to you by your health care provider. Make sure you discuss any questions you have with your health care provider.   Community Occupational psychologist of Services Cost  A Matter of Balance Class locations vary. Call [place] on Aging for more information.  http://dawson-may.com/ [phone] 8-Session program addressing the fear of falling and increasing activity levels of older adults Free to minimal cost  A.C.T. By [employer] [address] [place], [place] [address].  BetaBlues.dk [phone]  Personal training, gym, classes including Silver Sneakers* and ACTion for Aging Adults Fee-based  A.H.O.Y. (Add Health to [place]) Airs on Time [employer] 13, M-F at [place]: [employer],  [facility] [place],  [place], [place] [facility], [address] [facility], [place], [place], [place] [address]  [place] Location: Sharrell Ku. [employer] [place]      [phone]  [phone]  [phone]  [phone]  [phone]  [phone]  [phone]  [phone]  [phone]  [phone]    [phone] A total-body conditioning class for adults 19 and older; designed to increase muscular strength, endurance, range of movement, flexibility, balance, agility and coordination Free  [facility] [place], [place] [address] [place]      [address]      [phone]      Pilate's class for individualsreturning to  exercise after an injury, before or after surgery or for individuals with complex musculoskeletal issues; designed to improve strength, balance , flexibility      $15/class  [place] [address] [place], [place] [address] [email or web address] [place] classes for beginners to advanced [place], [place] [address] [email or web address] [place] Chair Exercises Free, ages 59 and older; Ages 60-59 fee based  Marvia Pickles, [facility] [address], [place] [address] [email or web address] [phone]  A.H.O.Y. Tai Chi Fee-based Donation based or free  [place] Class locations vary.  Call or email Angela Burke or view website for more information. [email or web address] [email or web address] [phone] Ongoing classes at local YMCAs and gyms Fee-based  Silver Sneakers A.C.T. By [place] [employer]: [facility] [employer] [phone]  [phone] Classes  designed for older adults who want to improve their strength, flexibility, balance and endurance.   Silver sneakers is covered by some insurance plans and includes a fitness center membership at participating locations. Find out more by calling [phone] or visiting [email or web address] Covered by some insurance plans  [facility] [place] [phone] A.H.O.Y., fitness room, personal  training, fitness classes for injury prevention, strength, balance, flexibility, water fitness classes Ages 55+: $19 for 6 months; Ages 28-54: $75 for 6 months  Tai Chi for Everybody [facility] [address] [place], [place] [address] [email or web address] [phone] Tai Chi classes for beginners to advanced; geared for seniors Donation Based      [employer] ([place] Others Participate in Exercise     Loyal Gambler. Rosana Hoes, PhD, [place] [email or web address] [place]     [phone]     A comprehensive fitness program for adults.  The program paris senior-level undergraduates Kinesiology students with adults who desire to learn how to exercise safely.  Includes a structural exercise class focusing on functional fitnesss     $100/semester in fall and spring; $75 in summer (no trainers)    *Silver Sneakers is covered by some Personal assistant and includes a  Radio producer at participating locations.  Find out more by calling [phone] or visiting [email or web address]  For additional health and human services resources for senior adults, please contact SeniorLine at [phone] in [place] and [place] at [phone] in all other areas.

## 2015-08-14 NOTE — Assessment & Plan Note (Signed)
Is taking vitamin D, will recheck his levels.

## 2015-08-14 NOTE — Assessment & Plan Note (Signed)
Blood pressure control today on amlodipine. We will send for labs today.

## 2015-08-14 NOTE — Progress Notes (Signed)
Subjective:   Casey Klein is a 74 y.o. male who presents for an Initial Medicare Annual Wellness Visit.  Review of Systems  Review of Systems  Constitutional: Negative for fever, chills, weight loss and malaise/fatigue.  HENT: Negative for ear discharge, ear pain and hearing loss.   Eyes: Negative for discharge and redness.  Respiratory: Negative for cough and wheezing.   Cardiovascular: Negative for chest pain, palpitations and leg swelling.  Gastrointestinal: Negative for heartburn, abdominal pain, diarrhea, constipation and melena.  Genitourinary: Negative for dysuria and urgency.  Musculoskeletal: Negative for myalgias and joint pain.  Skin: Negative for rash.  Neurological: Negative for dizziness, tremors and weakness.  Psychiatric/Behavioral: Negative for depression and suicidal ideas. The patient is not nervous/anxious and does not have insomnia.     Cardiac Risk Factors include: advanced age (>58mn, >>70women);hypertension;male gender    Objective:    Today's Vitals   08/14/15 1316  BP: 139/87  Pulse: 83  Temp: 98.1 F (36.7 C)  TempSrc: Oral  Height: 5' 9"  (1.753 m)  Weight: 194 lb (87.998 kg)    Current Medications (verified) Outpatient Encounter Prescriptions as of 08/14/2015  Medication Sig  . acetaminophen (TYLENOL) 500 MG tablet Take 500-1,000 mg by mouth every 6 (six) hours as needed for pain.  .Marland KitchenamLODipine (NORVASC) 10 MG tablet TAKE 1 TABLET (10 MG TOTAL) BY MOUTH DAILY.  . budesonide-formoterol (SYMBICORT) 80-4.5 MCG/ACT inhaler Inhale 2 puffs into the lungs 2 (two) times daily.  . citalopram (CELEXA) 20 MG tablet TAKE 1 TABLET (20 MG TOTAL) BY MOUTH DAILY.  . cyanocobalamin (,VITAMIN B-12,) 1000 MCG/ML injection Inject one ML IM daily for a week and then weekly for 4 weeks then monthly.  . meloxicam (MOBIC) 15 MG tablet TAKE 1 TABLET (15 MG TOTAL) BY MOUTH DAILY.  . montelukast (SINGULAIR) 10 MG tablet Take 1 tablet (10 mg total) by mouth at  bedtime.  . Vitamin D, Ergocalciferol, (DRISDOL) 50000 UNITS CAPS capsule Take 1 capsule (50,000 Units total) by mouth every 7 (seven) days.  . [DISCONTINUED] amLODipine (NORVASC) 10 MG tablet TAKE 1 TABLET (10 MG TOTAL) BY MOUTH DAILY.  . [DISCONTINUED] citalopram (CELEXA) 20 MG tablet TAKE 1 TABLET (20 MG TOTAL) BY MOUTH DAILY.  . [DISCONTINUED] meloxicam (MOBIC) 15 MG tablet TAKE 1 TABLET (15 MG TOTAL) BY MOUTH DAILY.   Facility-Administered Encounter Medications as of 08/14/2015  Medication  . cyanocobalamin ((VITAMIN B-12)) injection 1,000 mcg    Allergies (verified) Penicillins and Pneumococcal vaccines   History: Past Medical History  Diagnosis Date  . CAD (coronary artery disease)     Nonobstructive  . Carotid stenosis     50% bilateral 2012  . PAT (paroxysmal atrial tachycardia)   . HTN (hypertension)   . History of kidney stones    Past Surgical History  Procedure Laterality Date  . Cervical disc surgery    . Back surgery      lumbar disc  . Cataract extraction w/phaco Right 05/06/2013    Procedure: CATARACT EXTRACTION PHACO AND INTRAOCULAR LENS PLACEMENT (IOC);  Surgeon: CWilliams Che MD;  Location: [facility];  Service: Ophthalmology;  Laterality: Right;  CDE: 19.84  . Cataract extraction w/phaco Left 06/24/2013    Procedure: CATARACT EXTRACTION PHACO AND INTRAOCULAR LENS PLACEMENT (IOC);  Surgeon: CWilliams Che MD;  Location: [facility];  Service: Ophthalmology;  Laterality: Left;  CDE:  18.21   History reviewed. No pertinent family history. Social History   Occupational History  .  Not on file.   Social History Main Topics  . Smoking status: Former Smoker -- 1.00 packs/day for 10 years    Types: Cigarettes    Start date: 02/07/1983  . Smokeless tobacco: Not on file  . Alcohol Use: No  . Drug Use: No  . Sexual Activity: Yes    Birth Control/ Protection: None   Tobacco Counseling Counseling given: Not Answered   Activities of Daily Living In your present  state of health, do you have any difficulty performing the following activities: 08/14/2015  Hearing? N  Vision? N  Difficulty concentrating or making decisions? N  Walking or climbing stairs? N  Dressing or bathing? N  Doing errands, shopping? N  Preparing Food and eating ? N  Using the Toilet? N  In the past six months, have you accidently leaked urine? Y  Do you have problems with loss of bowel control? N  Managing your Medications? N  Managing your Finances? N  Housekeeping or managing your Housekeeping? N    Immunizations and Health Maintenance Immunization History  Administered Date(s) Administered  . Influenza, High Dose Seasonal PF 09/06/2013  . Influenza,inj,Quad PF,36+ Mos 09/17/2014  . Pneumococcal-Unspecified 09/06/2013   There are no preventive care reminders to display for this patient.  Patient Care Team: Sharion Balloon, FNP as PCP - General (Nurse Practitioner) Minus Breeding, MD as Consulting Physician (Cardiology)  Indicate any recent Medical Services you may have received from other than [facility] providers in the past year (date may be approximate).    Assessment:   This is a routine wellness examination for [place].   Hearing/Vision screen Vision Screening Comments: Cataract removal last year with eye exam - Dr. Yolanda Bonine, [facility]  Dietary issues and exercise activities discussed: Current Exercise Habits:: Home exercise routine, Type of exercise: walking, Time (Minutes): 15, Frequency (Times/Week): 5, Weekly Exercise (Minutes/Week): 75, Intensity: Mild  Goals    None     Depression Screen PHQ 2/9 Scores 08/14/2015 07/27/2015 01/26/2015 09/17/2014  PHQ - 2 Score 0 0 0 1    Fall Risk Fall Risk  08/14/2015 07/27/2015 01/26/2015 09/17/2014 05/14/2014  Falls in the past year? Yes Yes No Yes Yes  Number falls in past yr: 2 or more 2 or more - 1 1  Injury with Fall? No No - No No    Cognitive Function: MMSE - Mini Mental State Exam 08/14/2015  Orientation to time 5    Orientation to Place 5  Registration 3  Attention/ Calculation 0  Recall 3  Language- name 2 objects 2  Language- repeat 1  Language- follow 3 step command 3  Language- read & follow direction 1  Write a sentence 1  Copy design 1  Total score 25    Screening Tests Health Maintenance  Topic Date Due  . INFLUENZA VACCINE  09/13/2015 (Originally 07/06/2015)  . COLONOSCOPY  01/27/2016 (Originally 11/18/1989)  . ZOSTAVAX  08/13/2016 (Originally 11/19/1999)  . PNA vac Low Risk Adult (2 of 2 - PCV13) 07/27/2027 (Originally 09/06/2014)  . TETANUS/TDAP  01/26/2023        Plan:     Problem List Items Addressed This Visit      Cardiovascular and Mediastinum   ESSENTIAL HYPERTENSION, BENIGN    Blood pressure control today on amlodipine. We will send for labs today.      Relevant Orders   BMP8+EGFR   Lipid panel     Other   Vitamin D deficiency    Is taking vitamin D,  will recheck his levels.      Relevant Orders   Vit D  25 hydroxy (rtn osteoporosis monitoring)   Risk for falls    Patient is at increased risk for falls and has fallen in the past year twice that he admits to. He does not feel like he wants any home health aide and physical therapy. We did provide a list of places that he could go for exercising to help with this. He is able to ambulate without issues in the room he just says he gets occasional vertigo.       Other Visit Diagnoses    Routine history and physical examination of adult    -  Primary    Essential hypertension        Relevant Orders    BMP8+EGFR    Vit D  25 hydroxy (rtn osteoporosis monitoring)    Lipid panel      During the course of the visit [place] was educated and counseled about the following appropriate screening and preventive services:   Vaccines to include Pneumoccal, Influenza, Hepatitis B, Td, Zostavax, HCV  Electrocardiogram  Colorectal cancer screening  Cardiovascular disease screening  Diabetes screening  Glaucoma  screening  Nutrition counseling  Prostate cancer screening  Smoking cessation counseling  Patient Instructions (the written plan) were given to the patient.   Worthy Rancher, MD   08/14/2015

## 2015-08-15 ENCOUNTER — Other Ambulatory Visit: Payer: Commercial Managed Care - HMO

## 2015-08-15 DIAGNOSIS — I10 Essential (primary) hypertension: Secondary | ICD-10-CM

## 2015-08-17 LAB — BMP8+EGFR
BUN / CREAT RATIO: 12 (ref 10–22)
BUN: 11 mg/dL (ref 8–27)
CALCIUM: 8.9 mg/dL (ref 8.6–10.2)
CO2: 22 mmol/L (ref 18–29)
Chloride: 103 mmol/L (ref 97–108)
Creatinine, Ser: 0.9 mg/dL (ref 0.76–1.27)
GFR, EST AFRICAN AMERICAN: 96 mL/min/{1.73_m2} (ref >59)
GFR, EST NON AFRICAN AMERICAN: 83 mL/min/{1.73_m2} (ref >59)
Glucose: 92 mg/dL (ref 65–99)
POTASSIUM: 3.6 mmol/L (ref 3.5–5.2)
SODIUM: 145 mmol/L — AB (ref 134–144)

## 2015-08-17 LAB — LIPID PANEL
CHOL/HDL RATIO: 4.2 ratio (ref 0.0–5.0)
Cholesterol, Total: 175 mg/dL (ref 100–199)
HDL: 42 mg/dL (ref >39)
LDL Calculated: 97 mg/dL (ref 0–99)
Triglycerides: 178 mg/dL — ABNORMAL HIGH (ref 0–149)
VLDL Cholesterol Cal: 36 mg/dL (ref 5–40)

## 2015-08-17 LAB — VITAMIN D 25 HYDROXY (VIT D DEFICIENCY, FRACTURES): VIT D 25 HYDROXY: 31.1 ng/mL (ref 30.0–100.0)

## 2015-09-01 ENCOUNTER — Ambulatory Visit (INDEPENDENT_AMBULATORY_CARE_PROVIDER_SITE_OTHER): Payer: Commercial Managed Care - HMO | Admitting: *Deleted

## 2015-09-01 DIAGNOSIS — Z23 Encounter for immunization: Secondary | ICD-10-CM

## 2015-09-01 DIAGNOSIS — E538 Deficiency of other specified B group vitamins: Secondary | ICD-10-CM

## 2015-09-01 NOTE — Patient Instructions (Signed)

## 2015-09-01 NOTE — Progress Notes (Signed)
Vitamin b12 injection given and tolerated well.  

## 2015-09-21 ENCOUNTER — Encounter: Payer: Self-pay | Admitting: Family

## 2015-10-02 ENCOUNTER — Ambulatory Visit (INDEPENDENT_AMBULATORY_CARE_PROVIDER_SITE_OTHER): Payer: Commercial Managed Care - HMO | Admitting: *Deleted

## 2015-10-02 ENCOUNTER — Telehealth: Payer: Self-pay

## 2015-10-02 VITALS — BP 133/74 | HR 62

## 2015-10-02 DIAGNOSIS — I10 Essential (primary) hypertension: Secondary | ICD-10-CM

## 2015-10-02 DIAGNOSIS — E538 Deficiency of other specified B group vitamins: Secondary | ICD-10-CM | POA: Diagnosis not present

## 2015-10-02 MED ORDER — MECLIZINE HCL 50 MG PO TABS: 50.0000 mg | ORAL_TABLET | Freq: Three times a day (TID) | ORAL | Status: DC | PRN

## 2015-10-02 NOTE — Telephone Encounter (Signed)
Antivert Prescription sent to pharmacy   

## 2015-10-02 NOTE — Progress Notes (Signed)
Pt here today for B12 injection given IM right deltoid and tolerated well. Pt also here to have BP checked.

## 2015-10-02 NOTE — Patient Instructions (Signed)

## 2015-10-02 NOTE — Telephone Encounter (Signed)
Patient is complaining of dizziness, [facility]

## 2015-12-19 ENCOUNTER — Other Ambulatory Visit: Payer: Self-pay | Admitting: Family

## 2015-12-22 ENCOUNTER — Ambulatory Visit (INDEPENDENT_AMBULATORY_CARE_PROVIDER_SITE_OTHER): Payer: Commercial Managed Care - HMO | Admitting: *Deleted

## 2015-12-22 DIAGNOSIS — E538 Deficiency of other specified B group vitamins: Secondary | ICD-10-CM | POA: Diagnosis not present

## 2015-12-22 MED ORDER — CYANOCOBALAMIN 1000 MCG/ML IJ SOLN
1000.0000 ug | INTRAMUSCULAR | Status: AC
  Administered 2015-12-22 – 2016-12-02 (×12): 1000 ug via INTRAMUSCULAR

## 2015-12-22 NOTE — Patient Instructions (Signed)

## 2015-12-22 NOTE — Progress Notes (Signed)
Vitamin b12 injection given and tolerated well.  

## 2016-01-13 ENCOUNTER — Encounter: Payer: Self-pay | Admitting: Cardiology

## 2016-01-13 ENCOUNTER — Ambulatory Visit (INDEPENDENT_AMBULATORY_CARE_PROVIDER_SITE_OTHER): Payer: Commercial Managed Care - HMO | Admitting: Cardiology

## 2016-01-13 VITALS — BP 142/89 | HR 108 | Ht 69.0 in | Wt 205.0 lb

## 2016-01-13 DIAGNOSIS — I6523 Occlusion and stenosis of bilateral carotid arteries: Secondary | ICD-10-CM | POA: Diagnosis not present

## 2016-01-13 DIAGNOSIS — R011 Cardiac murmur, unspecified: Secondary | ICD-10-CM

## 2016-01-13 DIAGNOSIS — I10 Essential (primary) hypertension: Secondary | ICD-10-CM | POA: Diagnosis not present

## 2016-01-13 NOTE — Patient Instructions (Signed)
Medication Instructions:  The current medical regimen is effective;  continue present plan and medications.  Testing/Procedures: Your physician has requested that you have an echocardiogram at the [place] office.  Please report to the Main Entrance of [facility] to check in at 10:15 AM.  Echocardiography is a painless test that uses sound waves to create images of your heart. It provides your doctor with information about the size and shape of your heart and how well your heart's chambers and valves are working. This procedure takes approximately one hour. There are no restrictions for this procedure.  Your physician has requested that you have a carotid duplex at [facility].  Please check in at 12N in the Radiology Department. This test is an ultrasound of the carotid arteries in your neck. It looks at blood flow through these arteries that supply the brain with blood. Allow one hour for this exam. There are no restrictions or special instructions.  Follow-Up: Follow up in 18 months with Dr. Antoine Poche in the [place] office.  You will receive a letter in the mail 2 months before you are due.  Please call us when you receive this letter to schedule your follow up appointment.  If you need a refill on your cardiac medications before your next appointment, please call your pharmacy.  Thank you for choosing [facility]!!

## 2016-01-13 NOTE — Progress Notes (Signed)
HPI The patient presents for evaluation of chest pain. He has had a cardiac catheterization in the past demonstrating 30% LAD stenosis. The patient presents for followup of this and his hypertension.   He has also had a soft systolic murmur in the past.  Since I last saw him he has done well.  The patient denies any new symptoms such as chest discomfort, neck or arm discomfort. There has been no new shortness of breath, PND or orthopnea. There have been no reported palpitations, presyncope or syncope.  He does get some tingling in his arms depending on how he sleeps at night.     Allergies  Allergen Reactions  . Penicillins Other (See Comments)    unknown  . Pneumococcal Vaccines     Pt states had pneumococcal vaccine at [facility] on 09-06-13 and arm swelled "huge with a lot swelling"     Current Outpatient Prescriptions  Medication Sig Dispense Refill  . amLODipine (NORVASC) 10 MG tablet TAKE 1 TABLET (10 MG TOTAL) BY MOUTH DAILY. 90 tablet 3  . budesonide-formoterol (SYMBICORT) 80-4.5 MCG/ACT inhaler Inhale 2 puffs into the lungs 2 (two) times daily. 1 Inhaler 12  . citalopram (CELEXA) 20 MG tablet TAKE 1 TABLET (20 MG TOTAL) BY MOUTH DAILY. 90 tablet 3  . cyanocobalamin (,VITAMIN B-12,) 1000 MCG/ML injection Inject one ML IM daily for a week and then weekly for 4 weeks then monthly. 10 mL 3  . meclizine (ANTIVERT) 50 MG tablet Take 1 tablet (50 mg total) by mouth 3 (three) times daily as needed. 45 tablet 0  . meloxicam (MOBIC) 15 MG tablet TAKE 1 TABLET (15 MG TOTAL) BY MOUTH DAILY. 90 tablet 1  . montelukast (SINGULAIR) 10 MG tablet Take 1 tablet (10 mg total) by mouth at bedtime. 90 tablet 3  . naproxen sodium (ANAPROX) 220 MG tablet Take 220 mg by mouth 2 (two) times daily with a meal.    . Vitamin D, Ergocalciferol, (DRISDOL) 50000 units CAPS capsule TAKE 1 CAPSULE (50,000 UNITS TOTAL) BY MOUTH EVERY 7 (SEVEN) DAYS. 12 capsule 0   Current Facility-Administered Medications  Medication  Dose Route Frequency Provider Last Rate Last Dose  . cyanocobalamin ((VITAMIN B-12)) injection 1,000 mcg  1,000 mcg Intramuscular Q30 days Junie Spencer, FNP   1,000 mcg at 12/22/15 1151    ROS:  As stated in the HPI and negative for all other systems.  PHYSICAL EXAM BP 142/89 mmHg  Pulse 108  Ht  (1.753 m)  Wt 205 lb (92.987 kg)  BMI 30.26 kg/m2  SpO2 95% GENERAL:  Well appearing NECK:  No jugular venous distention, waveform within normal limits, carotid upstroke brisk and symmetric, no bruits, no thyromegaly LUNGS:  Clear to auscultation bilaterally CHEST:  Unremarkable HEART:  PMI not displaced or sustained,S1 and S2 within normal limits, no S3, no S4, no clicks, no rubs, soft apical systolic murmur early peaking, no diastolic murmurs ABD:  Flat, positive bowel sounds normal in frequency in pitch, no bruits, no rebound, no guarding, no midline pulsatile mass, no hepatomegaly, no splenomegaly EXT:  2 plus pulses throughout, no edema, no cyanosis no clubbing  EKG:  Sinus rhythm, rate 90, axis within normal limits, intervals within normal limits, no acute ST-T wave changes.  01/13/2016   ASSESSMENT AND PLAN  CHEST PAIN:  The patient has no new sypmtoms.  No further cardiovascular testing is indicated.    HTN:  The blood pressure is at target. No change in medications is  indicated. We will continue with therapeutic lifestyle changes (TLC).  CAROTID STENOSIS:  He had 40-59% right stenosis and is overdue for follow-up. I will arrange a Doppler.  MURMUR:  This might represent some dynamic outflow obstruction but it is very mild. he has been quite a while since he had an echo (7 years).  I will follow up with one of these.

## 2016-01-14 ENCOUNTER — Encounter: Payer: Self-pay | Admitting: Cardiology

## 2016-01-22 ENCOUNTER — Ambulatory Visit (INDEPENDENT_AMBULATORY_CARE_PROVIDER_SITE_OTHER): Payer: Commercial Managed Care - HMO | Admitting: *Deleted

## 2016-01-22 DIAGNOSIS — E538 Deficiency of other specified B group vitamins: Secondary | ICD-10-CM

## 2016-01-22 NOTE — Patient Instructions (Signed)

## 2016-01-22 NOTE — Progress Notes (Signed)
Vitamin b12 injection given and tolerated well.  

## 2016-01-27 ENCOUNTER — Ambulatory Visit: Payer: Commercial Managed Care - HMO | Admitting: Cardiology

## 2016-01-28 ENCOUNTER — Ambulatory Visit (INDEPENDENT_AMBULATORY_CARE_PROVIDER_SITE_OTHER): Payer: Commercial Managed Care - HMO | Admitting: Family

## 2016-01-28 ENCOUNTER — Encounter: Payer: Self-pay | Admitting: Family

## 2016-01-28 ENCOUNTER — Other Ambulatory Visit: Payer: Self-pay | Admitting: Family

## 2016-01-28 VITALS — BP 145/84 | HR 76 | Temp 95.6°F | Ht 69.0 in | Wt 204.2 lb

## 2016-01-28 DIAGNOSIS — E559 Vitamin D deficiency, unspecified: Secondary | ICD-10-CM | POA: Diagnosis not present

## 2016-01-28 DIAGNOSIS — E669 Obesity, unspecified: Secondary | ICD-10-CM | POA: Diagnosis not present

## 2016-01-28 DIAGNOSIS — F411 Generalized anxiety disorder: Secondary | ICD-10-CM | POA: Diagnosis not present

## 2016-01-28 DIAGNOSIS — I2510 Atherosclerotic heart disease of native coronary artery without angina pectoris: Secondary | ICD-10-CM | POA: Diagnosis not present

## 2016-01-28 DIAGNOSIS — J4520 Mild intermittent asthma, uncomplicated: Secondary | ICD-10-CM | POA: Diagnosis not present

## 2016-01-28 DIAGNOSIS — F32A Depression, unspecified: Secondary | ICD-10-CM

## 2016-01-28 DIAGNOSIS — M1990 Unspecified osteoarthritis, unspecified site: Secondary | ICD-10-CM | POA: Diagnosis not present

## 2016-01-28 DIAGNOSIS — F329 Major depressive disorder, single episode, unspecified: Secondary | ICD-10-CM

## 2016-01-28 DIAGNOSIS — E538 Deficiency of other specified B group vitamins: Secondary | ICD-10-CM | POA: Diagnosis not present

## 2016-01-28 DIAGNOSIS — I10 Essential (primary) hypertension: Secondary | ICD-10-CM

## 2016-01-28 DIAGNOSIS — Z9181 History of falling: Secondary | ICD-10-CM | POA: Diagnosis not present

## 2016-01-28 MED ORDER — LISINOPRIL 10 MG PO TABS: 10.0000 mg | ORAL_TABLET | Freq: Every day | ORAL | Status: DC

## 2016-01-28 NOTE — Addendum Note (Signed)
Addended by: Monica Becton on: 01/28/2016 09:33 AM   Modules accepted: Kipp Brood

## 2016-01-28 NOTE — Patient Instructions (Signed)

## 2016-01-28 NOTE — Progress Notes (Signed)
Subjective:    Patient ID: Casey Klein, male    DOB: 11/17/1941, 75 y.o.   MRN: [id number]  Pt presents to the office today for chronic follow up. Pt is followed by Cardiologists once a year.  Hypertension This is a chronic problem. The current episode started more than 1 year ago. The problem has been waxing and waning since onset. The problem is uncontrolled. Associated symptoms include peripheral edema ("somethimes") and shortness of breath ("at times:). Pertinent negatives include no anxiety, chest pain, headaches, malaise/fatigue, palpitations or sweats. Risk factors for coronary artery disease include male gender. Past treatments include calcium channel blockers. The current treatment provides mild improvement. Hypertensive end-organ damage includes CAD/MI. There is no history of kidney disease, CVA, heart failure or a thyroid problem. There is no history of sleep apnea.  Depression        This is a chronic problem.  The current episode started more than 1 year ago.   The onset quality is gradual.   The problem occurs rarely.  The problem has been waxing and waning since onset.  Associated symptoms include sad.  Associated symptoms include no helplessness, no hopelessness, not irritable, no restlessness and no headaches.     The symptoms are aggravated by family issues.  Past treatments include SSRIs - Selective serotonin reuptake inhibitors.  Compliance with treatment is good.   Pertinent negatives include no thyroid problem and no anxiety. Coronary Artery Disease Presents for follow-up visit. Symptoms include shortness of breath ("at times:). Pertinent negatives include no chest pain, dizziness or palpitations. Risk factors include hypertension. Risk factors do not include tobacco use.  Asthma He complains of cough, shortness of breath ("at times:) and wheezing. There is no difficulty breathing or hoarse voice. This is a chronic problem. The current episode started more than 1 year ago.  The problem occurs intermittently. The problem has been waxing and waning. The cough is productive. Pertinent negatives include no chest pain, ear congestion, headaches, malaise/fatigue or sweats. His symptoms are aggravated by exercise. His symptoms are alleviated by rest. He reports moderate improvement on treatment. His past medical history is significant for asthma. There is no history of COPD.  Anxiety Presents for follow-up visit. Onset was more than 5 years ago. Symptoms include depressed mood, excessive worry, nervous/anxious behavior and shortness of breath ("at times:). Patient reports no chest pain, dizziness, palpitations or restlessness. Symptoms occur rarely.   His past medical history is significant for asthma and CAD. Past treatments include SSRIs. Compliance with prior treatments has been good.  Arthritis Presents for follow-up visit. He complains of pain and stiffness. Affected locations include the left hip, left ankle and left MCP. Associated symptoms include rash. His pertinent risk factors include overuse. Past treatments include NSAIDs. The treatment provided mild relief.      Review of Systems  Constitutional: Negative.  Negative for malaise/fatigue.  HENT: Negative.  Negative for hoarse voice.   Respiratory: Positive for cough, shortness of breath ("at times:) and wheezing.   Cardiovascular: Negative.  Negative for chest pain and palpitations.  Gastrointestinal: Negative.   Endocrine: Negative.   Genitourinary: Negative.   Musculoskeletal: Positive for arthritis and stiffness.  Skin: Positive for rash.  Neurological: Negative.  Negative for dizziness and headaches.  Hematological: Negative.   Psychiatric/Behavioral: Positive for depression. The patient is nervous/anxious.   All other systems reviewed and are negative.      Objective:   Physical Exam  Constitutional: He is oriented to person, place, and  time. He appears well-developed and well-nourished. He is  not irritable. No distress.  HENT:  Head: Normocephalic.  Right Ear: External ear normal.  Left Ear: External ear normal.  Nose: Nose normal.  Mouth/Throat: Oropharynx is clear and moist.  Eyes: Pupils are equal, round, and reactive to light. Right eye exhibits no discharge. Left eye exhibits no discharge.  Neck: Normal range of motion. Neck supple. No thyromegaly present.  Cardiovascular: Normal rate, regular rhythm, normal heart sounds and intact distal pulses.   No murmur heard. Pulmonary/Chest: Effort normal and breath sounds normal. No respiratory distress. He has no wheezes.  Abdominal: Soft. Bowel sounds are normal. He exhibits no distension. There is no tenderness.  Musculoskeletal: Normal range of motion. He exhibits no edema or tenderness.  Neurological: He is alert and oriented to person, place, and time. He has normal reflexes. No cranial nerve deficit.  Skin: Skin is warm and dry. No rash noted. No erythema.  Psychiatric: He has a normal mood and affect. His behavior is normal. Judgment and thought content normal.  Vitals reviewed.   BP 145/84 mmHg  Pulse 76  Temp(Src) 95.6 F (35.3 C) (Oral)  Ht 5' 9"  (1.753 m)  Wt 204 lb 3.2 oz (92.625 kg)  BMI 30.14 kg/m2       Assessment & Plan:  1. Essential hypertension, benign -Pt started on lisinopril 10 mg today - lisinopril (PRINIVIL,ZESTRIL) 10 MG tablet; Take 1 tablet (10 mg total) by mouth daily.  Dispense: 90 tablet; Refill: 1 - CMP14+EGFR  2. Coronary artery disease involving native coronary artery of native heart without angina pectoris - lisinopril (PRINIVIL,ZESTRIL) 10 MG tablet; Take 1 tablet (10 mg total) by mouth daily.  Dispense: 90 tablet; Refill: 1 - CMP14+EGFR  3. Asthma, mild intermittent, uncomplicated - UYE33+IDHW  4. Arthritis - CMP14+EGFR  5. Depression - CMP14+EGFR  6. GAD (generalized anxiety disorder) - CMP14+EGFR  7. Vitamin D deficiency - CMP14+EGFR - VITAMIN D 25 Hydroxy (Vit-D  Deficiency, Fractures)  8. Risk for falls - CMP14+EGFR  9. Obesity (BMI 30-39.9) - CMP14+EGFR  10. Vitamin B 12 deficiency - CMP14+EGFR - Vitamin B12   Continue all meds Labs pending Health Maintenance reviewed Diet and exercise encouraged RTO 2 weeks to recheck HTN  Evelina Dun, FNP

## 2016-01-29 LAB — CMP14+EGFR
ALBUMIN: 4.7 g/dL (ref 3.5–4.8)
ALT: 25 IU/L (ref 0–44)
AST: 23 IU/L (ref 0–40)
Albumin/Globulin Ratio: 2 (ref 1.1–2.5)
Alkaline Phosphatase: 48 IU/L (ref 39–117)
BILIRUBIN TOTAL: 0.8 mg/dL (ref 0.0–1.2)
BUN / CREAT RATIO: 15 (ref 10–22)
BUN: 13 mg/dL (ref 8–27)
CALCIUM: 8.9 mg/dL (ref 8.6–10.2)
CO2: 20 mmol/L (ref 18–29)
CREATININE: 0.87 mg/dL (ref 0.76–1.27)
Chloride: 103 mmol/L (ref 96–106)
GFR, EST AFRICAN AMERICAN: 97 mL/min/{1.73_m2} (ref >59)
GFR, EST NON AFRICAN AMERICAN: 84 mL/min/{1.73_m2} (ref >59)
GLUCOSE: 87 mg/dL (ref 65–99)
Globulin, Total: 2.4 g/dL (ref 1.5–4.5)
Potassium: 4.2 mmol/L (ref 3.5–5.2)
Sodium: 140 mmol/L (ref 134–144)
TOTAL PROTEIN: 7.1 g/dL (ref 6.0–8.5)

## 2016-01-29 LAB — VITAMIN B12: VITAMIN B 12: 613 pg/mL (ref 211–946)

## 2016-01-29 LAB — VITAMIN D 25 HYDROXY (VIT D DEFICIENCY, FRACTURES): VIT D 25 HYDROXY: 27.4 ng/mL — AB (ref 30.0–100.0)

## 2016-02-08 ENCOUNTER — Ambulatory Visit (HOSPITAL_COMMUNITY)
Admission: RE | Admit: 2016-02-08 | Discharge: 2016-02-08 | Disposition: A | Discharge status: Home / Self Care | Payer: Commercial Managed Care - HMO | Source: Ambulatory Visit | Attending: Cardiology | Admitting: Cardiology

## 2016-02-08 DIAGNOSIS — I10 Essential (primary) hypertension: Secondary | ICD-10-CM | POA: Diagnosis not present

## 2016-02-08 DIAGNOSIS — I119 Hypertensive heart disease without heart failure: Secondary | ICD-10-CM | POA: Insufficient documentation

## 2016-02-08 DIAGNOSIS — I6523 Occlusion and stenosis of bilateral carotid arteries: Secondary | ICD-10-CM

## 2016-02-08 DIAGNOSIS — I340 Nonrheumatic mitral (valve) insufficiency: Secondary | ICD-10-CM | POA: Insufficient documentation

## 2016-02-08 DIAGNOSIS — R011 Cardiac murmur, unspecified: Secondary | ICD-10-CM | POA: Insufficient documentation

## 2016-02-09 ENCOUNTER — Telehealth: Payer: Self-pay

## 2016-02-09 DIAGNOSIS — I6529 Occlusion and stenosis of unspecified carotid artery: Secondary | ICD-10-CM

## 2016-02-09 NOTE — Telephone Encounter (Signed)
Informed patient of results and verbal understanding expressed.  Repeat carotids ordered to be scheduled in one year. Patient agrees with treatment plan.  Forwarded to [employer]y Hawks.

## 2016-02-09 NOTE — Telephone Encounter (Signed)
-----   Message from James Hochrein, MD sent at 3/Rollene Rotunda6/2017 11:07 PM EST ----- Moderate right sided plaque.  Follow up in one year.  Call Mr. Ermalinda MemosBradshaw with the results and send results to Jannifer Rodneyhristy Hawks, FNP

## 2016-02-10 ENCOUNTER — Other Ambulatory Visit: Payer: Self-pay | Admitting: Family

## 2016-02-12 ENCOUNTER — Ambulatory Visit (INDEPENDENT_AMBULATORY_CARE_PROVIDER_SITE_OTHER): Payer: Commercial Managed Care - HMO | Admitting: Family

## 2016-02-12 ENCOUNTER — Encounter: Payer: Self-pay | Admitting: Family

## 2016-02-12 ENCOUNTER — Telehealth: Payer: Self-pay | Admitting: Family

## 2016-02-12 VITALS — BP 122/73 | HR 77 | Temp 96.7°F | Ht 69.0 in | Wt 205.2 lb

## 2016-02-12 DIAGNOSIS — I10 Essential (primary) hypertension: Secondary | ICD-10-CM

## 2016-02-12 LAB — BMP8+EGFR
BUN / CREAT RATIO: 19 (ref 10–22)
BUN: 17 mg/dL (ref 8–27)
CALCIUM: 9.3 mg/dL (ref 8.6–10.2)
CO2: 21 mmol/L (ref 18–29)
Chloride: 102 mmol/L (ref 96–106)
Creatinine, Ser: 0.91 mg/dL (ref 0.76–1.27)
GFR, EST AFRICAN AMERICAN: 94 mL/min/{1.73_m2} (ref >59)
GFR, EST NON AFRICAN AMERICAN: 82 mL/min/{1.73_m2} (ref >59)
Glucose: 120 mg/dL — ABNORMAL HIGH (ref 65–99)
Potassium: 4 mmol/L (ref 3.5–5.2)
Sodium: 141 mmol/L (ref 134–144)

## 2016-02-12 MED ORDER — ASPIRIN EC 81 MG PO TBEC: 81.0000 mg | DELAYED_RELEASE_TABLET | Freq: Every day | ORAL | Status: DC

## 2016-02-12 NOTE — Progress Notes (Signed)
   Subjective:    Patient ID: Casey Klein, male    DOB: [birth year], 75 y.o.   MRN: [id number]  Pt presents to the office today to recheck HTN. PT's BP is at goal today! Hypertension This is a chronic problem. The current episode started more than 1 year ago. The problem has been resolved since onset. The problem is controlled. Associated symptoms include anxiety. Pertinent negatives include no blurred vision, headaches, palpitations, peripheral edema or shortness of breath. Risk factors for coronary artery disease include male gender, obesity and family history. Past treatments include ACE inhibitors. The current treatment provides moderate improvement. There is no history of kidney disease, CAD/MI, CVA or heart failure.      Review of Systems  Constitutional: Negative.   HENT: Negative.   Eyes: Negative for blurred vision.  Respiratory: Negative.  Negative for shortness of breath.   Cardiovascular: Negative.  Negative for palpitations.  Gastrointestinal: Negative.   Endocrine: Negative.   Genitourinary: Negative.   Musculoskeletal: Negative.   Neurological: Negative.  Negative for headaches.  Hematological: Negative.   Psychiatric/Behavioral: Negative.   All other systems reviewed and are negative.      Objective:   Physical Exam  Constitutional: He is oriented to person, place, and time. He appears well-developed and well-nourished. No distress.  HENT:  Head: Normocephalic.  Eyes: Pupils are equal, round, and reactive to light. Right eye exhibits no discharge. Left eye exhibits no discharge.  Neck: Normal range of motion. Neck supple. No thyromegaly present.  Cardiovascular: Normal rate, regular rhythm and intact distal pulses.   Murmur heard. Pulmonary/Chest: Effort normal and breath sounds normal. No respiratory distress. He has no wheezes.  Abdominal: Soft. Bowel sounds are normal. He exhibits no distension. There is no tenderness.  Musculoskeletal: Normal range of  motion. He exhibits no edema or tenderness.  Neurological: He is alert and oriented to person, place, and time.  Skin: Skin is warm and dry. No rash noted. No erythema.  Psychiatric: He has a normal mood and affect. His behavior is normal. Judgment and thought content normal.  Vitals reviewed.   BP 122/73 mmHg  Pulse 77  Temp(Src) 96.7 F (35.9 C) (Oral)  Ht '5\' 9"'$  (1.753 m)  Wt 205 lb 3.2 oz (93.078 kg)  BMI 30.29 kg/m2       Assessment & Plan:  1. Essential hypertension, benign -Dash diet information given -Exercise encouraged - Stress Management  -Continue current meds -RTO in 6 months - BMP8+EGFR   Continue all meds Labs pending Health Maintenance reviewed Diet and exercise encouraged RTO 6 months  Evelina Dun, FNP

## 2016-02-12 NOTE — Patient Instructions (Signed)
DASH Eating Plan °DASH stands for "Dietary Approaches to Stop Hypertension." The DASH eating plan is a healthy eating plan that has been shown to reduce high blood pressure (hypertension). Additional health benefits may include reducing the risk of type 2 diabetes mellitus, heart disease, and stroke. The DASH eating plan may also help with weight loss. °WHAT DO I NEED TO KNOW ABOUT THE DASH EATING PLAN? °For the DASH eating plan, you will follow these general guidelines: °· Choose foods with a percent daily value for sodium of less than 5% (as listed on the food label). °· Use salt-free seasonings or herbs instead of table salt or sea salt. °· Check with your health care provider or pharmacist before using salt substitutes. °· Eat lower-sodium products, often labeled as "lower sodium" or "no salt added." °· Eat fresh foods. °· Eat more vegetables, fruits, and low-fat dairy products. °· Choose whole grains. Look for the word "whole" as the first word in the ingredient list. °· Choose fish and skinless chicken or turkey more often than red meat. Limit fish, poultry, and meat to 6 oz (170 g) each day. °· Limit sweets, desserts, sugars, and sugary drinks. °· Choose heart-healthy fats. °· Limit cheese to 1 oz (28 g) per day. °· Eat more home-cooked food and less restaurant, buffet, and fast food. °· Limit fried foods. °· Cook foods using methods other than frying. °· Limit canned vegetables. If you do use them, rinse them well to decrease the sodium. °· When eating at a restaurant, ask that your food be prepared with less salt, or no salt if possible. °WHAT FOODS CAN I EAT? °Seek help from a dietitian for individual calorie needs. °Grains °Whole grain or whole wheat bread. Brown rice. Whole grain or whole wheat pasta. Quinoa, bulgur, and whole grain cereals. Low-sodium cereals. Corn or whole wheat flour tortillas. Whole grain cornbread. Whole grain crackers. Low-sodium crackers. °Vegetables °Fresh or frozen vegetables  (raw, steamed, roasted, or grilled). Low-sodium or reduced-sodium tomato and vegetable juices. Low-sodium or reduced-sodium tomato sauce and paste. Low-sodium or reduced-sodium canned vegetables.  °Fruits °All fresh, canned (in natural juice), or frozen fruits. °Meat and Other Protein Products °Ground beef (85% or leaner), grass-fed beef, or beef trimmed of fat. Skinless chicken or turkey. Ground chicken or turkey. Pork trimmed of fat. All fish and seafood. Eggs. Dried beans, peas, or lentils. Unsalted nuts and seeds. Unsalted canned beans. °Dairy °Low-fat dairy products, such as skim or 1% milk, 2% or reduced-fat cheeses, low-fat ricotta or cottage cheese, or plain low-fat yogurt. Low-sodium or reduced-sodium cheeses. °Fats and Oils °Tub margarines without trans fats. Light or reduced-fat mayonnaise and salad dressings (reduced sodium). Avocado. Safflower, olive, or canola oils. Natural peanut or almond butter. °Other °Unsalted popcorn and pretzels. °The items listed above may not be a complete list of recommended foods or beverages. Contact your dietitian for more options. °WHAT FOODS ARE NOT RECOMMENDED? °Grains °White bread. White pasta. White rice. Refined cornbread. Bagels and croissants. Crackers that contain trans fat. °Vegetables °Creamed or fried vegetables. Vegetables in a cheese sauce. Regular canned vegetables. Regular canned tomato sauce and paste. Regular tomato and vegetable juices. °Fruits °Dried fruits. Canned fruit in light or heavy syrup. Fruit juice. °Meat and Other Protein Products °Fatty cuts of meat. Ribs, chicken wings, bacon, sausage, bologna, salami, chitterlings, fatback, hot dogs, bratwurst, and packaged luncheon meats. Salted nuts and seeds. Canned beans with salt. °Dairy °Whole or 2% milk, cream, half-and-half, and cream cheese. Whole-fat or sweetened yogurt. Full-fat   cheeses or blue cheese. Nondairy creamers and whipped toppings. Processed cheese, cheese spreads, or cheese  curds. °Condiments °Onion and garlic salt, seasoned salt, table salt, and sea salt. Canned and packaged gravies. Worcestershire sauce. Tartar sauce. Barbecue sauce. Teriyaki sauce. Soy sauce, including reduced sodium. Steak sauce. Fish sauce. Oyster sauce. Cocktail sauce. Horseradish. Ketchup and mustard. Meat flavorings and tenderizers. Bouillon cubes. Hot sauce. Tabasco sauce. Marinades. Taco seasonings. Relishes. °Fats and Oils °Butter, stick margarine, lard, shortening, ghee, and bacon fat. Coconut, palm kernel, or palm oils. Regular salad dressings. °Other °Pickles and olives. Salted popcorn and pretzels. °The items listed above may not be a complete list of foods and beverages to avoid. Contact your dietitian for more information. °WHERE CAN I FIND MORE INFORMATION? °[employer], [employer]: www.nhlbi.nih.gov/health/health-topics/topics/dash/ °  °This information is not intended to replace advice given to you by your health care provider. Make sure you discuss any questions you have with your health care provider. °  °Document Released: 11/10/2011 Document Revised: 12/12/2014 Document Reviewed: 09/25/2013 °Elsevier Interactive Patient Education ©2016 [employer] ° °Hypertension °Hypertension, commonly called high blood pressure, is when the force of blood pumping through your arteries is too strong. Your arteries are the blood vessels that carry blood from your heart throughout your body. A blood pressure reading consists of a higher number over a lower number, such as 110/72. The higher number (systolic) is the pressure inside your arteries when your heart pumps. The lower number (diastolic) is the pressure inside your arteries when your heart relaxes. Ideally you want your blood pressure below 120/80. °Hypertension forces your heart to work harder to pump blood. Your arteries may become narrow or stiff. Having untreated or uncontrolled hypertension can cause heart attack, stroke, kidney  disease, and other problems. °RISK FACTORS °Some risk factors for high blood pressure are controllable. Others are not.  °Risk factors you cannot control include:  °· Race. You may be at higher risk if you are African American. °· Age. Risk increases with age. °· Gender. Men are at higher risk than women before age 45 years. After age 65, women are at higher risk than men. °Risk factors you can control include: °· Not getting enough exercise or physical activity. °· Being overweight. °· Getting too much fat, sugar, calories, or salt in your diet. °· Drinking too much alcohol. °SIGNS AND SYMPTOMS °Hypertension does not usually cause signs or symptoms. Extremely high blood pressure (hypertensive crisis) may cause headache, anxiety, shortness of breath, and nosebleed. °DIAGNOSIS °To check if you have hypertension, your health care provider will measure your blood pressure while you are seated, with your arm held at the level of your heart. It should be measured at least twice using the same arm. Certain conditions can cause a difference in blood pressure between your right and left arms. A blood pressure reading that is higher than normal on one occasion does not mean that you need treatment. If it is not clear whether you have high blood pressure, you may be asked to return on a different day to have your blood pressure checked again. Or, you may be asked to monitor your blood pressure at home for 1 or more weeks. °TREATMENT °Treating high blood pressure includes making lifestyle changes and possibly taking medicine. Living a healthy lifestyle can help lower high blood pressure. You may need to change some of your habits. °Lifestyle changes may include: °· Following the DASH diet. This diet is high in fruits, vegetables, and whole   grains. It is low in salt, red meat, and added sugars. °· Keep your sodium intake below 2,300 mg per day. °· Getting at least 30-45 minutes of aerobic exercise at least 4 times per  week. °· Losing weight if necessary. °· Not smoking. °· Limiting alcoholic beverages. °· Learning ways to reduce stress. °Your health care provider may prescribe medicine if lifestyle changes are not enough to get your blood pressure under control, and if one of the following is true: °· You are 18-59 years of age and your systolic blood pressure is above 140. °· You are 60 years of age or older, and your systolic blood pressure is above 150. °· Your diastolic blood pressure is above 90. °· You have diabetes, and your systolic blood pressure is over 140 or your diastolic blood pressure is over 90. °· You have kidney disease and your blood pressure is above 140/90. °· You have heart disease and your blood pressure is above 140/90. °Your personal target blood pressure may vary depending on your medical conditions, your age, and other factors. °HOME CARE INSTRUCTIONS °· Have your blood pressure rechecked as directed by your health care provider.   °· Take medicines only as directed by your health care provider. Follow the directions carefully. Blood pressure medicines must be taken as prescribed. The medicine does not work as well when you skip doses. Skipping doses also puts you at risk for problems. °· Do not smoke.   °· Monitor your blood pressure at home as directed by your health care provider.  °SEEK MEDICAL CARE IF:  °· You think you are having a reaction to medicines taken. °· You have recurrent headaches or feel dizzy. °· You have swelling in your ankles. °· You have trouble with your vision. °SEEK IMMEDIATE MEDICAL CARE IF: °· You develop a severe headache or confusion. °· You have unusual weakness, numbness, or feel faint. °· You have severe chest or abdominal pain. °· You vomit repeatedly. °· You have trouble breathing. °MAKE SURE YOU:  °· Understand these instructions. °· Will watch your condition. °· Will get help right away if you are not doing well or get worse. °  °This information is not intended to  replace advice given to you by your health care provider. Make sure you discuss any questions you have with your health care provider. °  °Document Released: 11/21/2005 Document Revised: 04/07/2015 Document Reviewed: 09/13/2013 °Elsevier Interactive Patient Education ©2016 [employer] ° °

## 2016-02-12 NOTE — Telephone Encounter (Signed)
Patient call by mistake

## 2016-02-15 NOTE — Progress Notes (Signed)
Patient aware.

## 2016-02-22 ENCOUNTER — Ambulatory Visit (INDEPENDENT_AMBULATORY_CARE_PROVIDER_SITE_OTHER): Payer: Commercial Managed Care - HMO | Admitting: *Deleted

## 2016-02-22 DIAGNOSIS — E538 Deficiency of other specified B group vitamins: Secondary | ICD-10-CM

## 2016-02-22 NOTE — Progress Notes (Signed)
Pt given B12 injection IM right deltoid and tolerated well. 

## 2016-02-22 NOTE — Patient Instructions (Signed)

## 2016-02-25 ENCOUNTER — Other Ambulatory Visit: Payer: Self-pay | Admitting: Family

## 2016-03-25 ENCOUNTER — Ambulatory Visit (INDEPENDENT_AMBULATORY_CARE_PROVIDER_SITE_OTHER): Payer: Commercial Managed Care - HMO | Admitting: *Deleted

## 2016-03-25 DIAGNOSIS — E538 Deficiency of other specified B group vitamins: Secondary | ICD-10-CM

## 2016-03-25 NOTE — Progress Notes (Signed)
Patient given b12 injection and tolerated well.  

## 2016-03-25 NOTE — Patient Instructions (Signed)

## 2016-04-25 ENCOUNTER — Ambulatory Visit (INDEPENDENT_AMBULATORY_CARE_PROVIDER_SITE_OTHER): Payer: Commercial Managed Care - HMO | Admitting: *Deleted

## 2016-04-25 DIAGNOSIS — E538 Deficiency of other specified B group vitamins: Secondary | ICD-10-CM | POA: Diagnosis not present

## 2016-04-25 NOTE — Progress Notes (Signed)
Pt given B12 injection IM right deltoid and tolerated well. 

## 2016-04-25 NOTE — Patient Instructions (Signed)

## 2016-05-17 ENCOUNTER — Encounter: Payer: Self-pay | Admitting: Family

## 2016-05-17 ENCOUNTER — Ambulatory Visit (INDEPENDENT_AMBULATORY_CARE_PROVIDER_SITE_OTHER): Payer: Commercial Managed Care - HMO | Admitting: Family

## 2016-05-17 VITALS — BP 127/71 | HR 72 | Temp 96.8°F | Ht 69.0 in | Wt 199.4 lb

## 2016-05-17 DIAGNOSIS — I2510 Atherosclerotic heart disease of native coronary artery without angina pectoris: Secondary | ICD-10-CM | POA: Diagnosis not present

## 2016-05-17 DIAGNOSIS — M1990 Unspecified osteoarthritis, unspecified site: Secondary | ICD-10-CM | POA: Diagnosis not present

## 2016-05-17 DIAGNOSIS — Z1211 Encounter for screening for malignant neoplasm of colon: Secondary | ICD-10-CM

## 2016-05-17 DIAGNOSIS — F411 Generalized anxiety disorder: Secondary | ICD-10-CM | POA: Diagnosis not present

## 2016-05-17 DIAGNOSIS — Z9181 History of falling: Secondary | ICD-10-CM | POA: Diagnosis not present

## 2016-05-17 DIAGNOSIS — E538 Deficiency of other specified B group vitamins: Secondary | ICD-10-CM | POA: Diagnosis not present

## 2016-05-17 DIAGNOSIS — F329 Major depressive disorder, single episode, unspecified: Secondary | ICD-10-CM

## 2016-05-17 DIAGNOSIS — I10 Essential (primary) hypertension: Secondary | ICD-10-CM | POA: Diagnosis not present

## 2016-05-17 DIAGNOSIS — E669 Obesity, unspecified: Secondary | ICD-10-CM | POA: Diagnosis not present

## 2016-05-17 DIAGNOSIS — F32A Depression, unspecified: Secondary | ICD-10-CM

## 2016-05-17 DIAGNOSIS — J4520 Mild intermittent asthma, uncomplicated: Secondary | ICD-10-CM

## 2016-05-17 DIAGNOSIS — E8881 Metabolic syndrome: Secondary | ICD-10-CM

## 2016-05-17 DIAGNOSIS — E559 Vitamin D deficiency, unspecified: Secondary | ICD-10-CM

## 2016-05-17 DIAGNOSIS — Z23 Encounter for immunization: Secondary | ICD-10-CM

## 2016-05-17 MED ORDER — MELOXICAM 15 MG PO TABS: ORAL_TABLET | ORAL | Status: DC

## 2016-05-17 MED ORDER — TRAMADOL HCL 50 MG PO TABS: 50.0000 mg | ORAL_TABLET | Freq: Three times a day (TID) | ORAL | Status: DC | PRN

## 2016-05-17 MED ORDER — LISINOPRIL 10 MG PO TABS: 10.0000 mg | ORAL_TABLET | Freq: Every day | ORAL | Status: DC

## 2016-05-17 MED ORDER — CITALOPRAM HYDROBROMIDE 20 MG PO TABS: ORAL_TABLET | ORAL | Status: DC

## 2016-05-17 NOTE — Progress Notes (Addendum)
Subjective:    Patient ID: Casey Klein, male    DOB: 11/21/1941, 75 y.o.   MRN: [id number]  Pt presents to the office today for chronic follow up. Pt is followed by Cardiologists once a year.  Hypertension This is a chronic problem. The current episode started more than 1 year ago. The problem has been resolved since onset. The problem is controlled. Associated symptoms include shortness of breath ("at times:). Pertinent negatives include no anxiety, chest pain, headaches, malaise/fatigue, palpitations, peripheral edema or sweats. Risk factors for coronary artery disease include male gender, obesity and sedentary lifestyle. Past treatments include calcium channel blockers and ACE inhibitors. The current treatment provides mild improvement. Hypertensive end-organ damage includes CAD/MI. There is no history of kidney disease, CVA, heart failure or a thyroid problem. There is no history of sleep apnea.  Depression        This is a chronic problem.  The current episode started more than 1 year ago.   The onset quality is gradual.   The problem occurs rarely.  The problem has been waxing and waning since onset.  Associated symptoms include sad.  Associated symptoms include no helplessness, no hopelessness, not irritable, no restlessness, no headaches and no suicidal ideas.     The symptoms are aggravated by family issues.  Past treatments include SSRIs - Selective serotonin reuptake inhibitors.  Compliance with treatment is good.   Pertinent negatives include no thyroid problem and no anxiety. Coronary Artery Disease Presents for follow-up visit. The disease course has been stable. Symptoms include shortness of breath ("at times:). Pertinent negatives include no chest pain, chest pressure, chest tightness, dizziness, palpitations or weight gain. Risk factors include hypertension. Risk factors do not include tobacco use.  Asthma He complains of cough and shortness of breath ("at times:). There is no  difficulty breathing, hoarse voice or wheezing. This is a chronic problem. The current episode started more than 1 year ago. The problem occurs intermittently. The problem has been waxing and waning. The cough is productive. Pertinent negatives include no chest pain, ear congestion, headaches, malaise/fatigue or sweats. His symptoms are aggravated by exercise. His symptoms are alleviated by rest. He reports moderate improvement on treatment. His past medical history is significant for asthma. There is no history of COPD.  Anxiety Presents for follow-up visit. Onset was more than 5 years ago. Symptoms include depressed mood, excessive worry, nervous/anxious behavior and shortness of breath ("at times:). Patient reports no chest pain, dizziness, palpitations, restlessness or suicidal ideas. Symptoms occur rarely.   His past medical history is significant for asthma and CAD. Past treatments include SSRIs. Compliance with prior treatments has been good.  Arthritis Presents for follow-up visit. He complains of pain and stiffness. Affected locations include the left hip, left ankle and left MCP (Bilateral hip). His pain is at a severity of 7/10. Associated symptoms include rash. His pertinent risk factors include overuse. Past treatments include NSAIDs. The treatment provided mild relief.      Review of Systems  Constitutional: Negative.  Negative for weight gain and malaise/fatigue.  HENT: Negative.  Negative for hoarse voice.   Respiratory: Positive for cough and shortness of breath ("at times:). Negative for chest tightness and wheezing.   Cardiovascular: Negative.  Negative for chest pain and palpitations.  Gastrointestinal: Negative.   Endocrine: Negative.   Genitourinary: Negative.   Musculoskeletal: Positive for arthritis and stiffness.  Skin: Positive for rash.  Neurological: Negative.  Negative for dizziness and headaches.  Hematological: Negative.  Psychiatric/Behavioral: Positive for  depression. Negative for suicidal ideas. The patient is nervous/anxious.   All other systems reviewed and are negative.      Objective:   Physical Exam  Constitutional: He is oriented to person, place, and time. He appears well-developed and well-nourished. He is not irritable. No distress.  HENT:  Head: Normocephalic.  Right Ear: External ear normal.  Left Ear: External ear normal.  Nose: Nose normal.  Mouth/Throat: Oropharynx is clear and moist.  Eyes: Pupils are equal, round, and reactive to light. Right eye exhibits no discharge. Left eye exhibits no discharge.  Neck: Normal range of motion. Neck supple. No thyromegaly present.  Cardiovascular: Normal rate, regular rhythm and intact distal pulses.   Murmur heard. Pulmonary/Chest: Effort normal and breath sounds normal. No respiratory distress. He has no wheezes.  Abdominal: Soft. Bowel sounds are normal. He exhibits no distension. There is no tenderness.  Musculoskeletal: Normal range of motion. He exhibits no edema or tenderness.  Neurological: He is alert and oriented to person, place, and time. No cranial nerve deficit.  Skin: Skin is warm and dry. No rash noted. No erythema.  Psychiatric: He has a normal mood and affect. His behavior is normal. Judgment and thought content normal.  Vitals reviewed.   BP 127/71 mmHg  Pulse 72  Temp(Src) 96.8 F (36 C) (Oral)  Ht 5' 9"  (1.753 m)  Wt 199 lb 6.4 oz (90.447 kg)  BMI 29.43 kg/m2       Assessment & Plan:  1. Essential hypertension, benign - lisinopril (PRINIVIL,ZESTRIL) 10 MG tablet; Take 1 tablet (10 mg total) by mouth daily.  Dispense: 90 tablet; Refill: 1 - CMP14+EGFR  2. Coronary artery disease involving native coronary artery of native heart without angina pectoris - lisinopril (PRINIVIL,ZESTRIL) 10 MG tablet; Take 1 tablet (10 mg total) by mouth daily.  Dispense: 90 tablet; Refill: 1 - CMP14+EGFR  3. Depression - citalopram (CELEXA) 20 MG tablet; TAKE 1 TABLET  (20 MG TOTAL) BY MOUTH DAILY.  Dispense: 90 tablet; Refill: 3 - CMP14+EGFR  4. GAD (generalized anxiety disorder) - citalopram (CELEXA) 20 MG tablet; TAKE 1 TABLET (20 MG TOTAL) BY MOUTH DAILY.  Dispense: 90 tablet; Refill: 3 - CMP14+EGFR  5. Asthma, mild intermittent, uncomplicated - BIP77+PZPS  6. Vitamin B 12 deficiency - CMP14+EGFR  7. Arthritis - meloxicam (MOBIC) 15 MG tablet; TAKE 1 TABLET (15 MG TOTAL) BY MOUTH DAILY.  Dispense: 30 tablet; Refill: 2 - CMP14+EGFR - traMADol (ULTRAM) 50 MG tablet; Take 1-2 tablets (50-100 mg total) by mouth every 8 (eight) hours as needed.  Dispense: 90 tablet; Refill: 2  8. Vitamin D deficiency - CMP14+EGFR  9. Risk for falls - CMP14+EGFR  10. Obesity (BMI 30-39.9) - CMP14+EGFR  11. Metabolic syndrome - UGA48+EFUW  12. Colon cancer screening - Fecal occult blood, imunochemical; Future   Continue all meds Labs pending Health Maintenance reviewed- Zoster given today Diet and exercise encouraged RTO 6 months  Evelina Dun, FNP

## 2016-05-17 NOTE — Addendum Note (Signed)
Addended by: Jannifer RodneyHAWKS, Zuleyma Scharf A on: 05/17/2016 11:08 AM   Modules accepted: SmartSet

## 2016-05-17 NOTE — Addendum Note (Signed)
Addended by: Almeta MonasSTONE, Treyvion Durkee M on: 05/17/2016 12:11 PM   Modules accepted: Orders, SmartSet

## 2016-05-17 NOTE — Patient Instructions (Signed)

## 2016-05-18 LAB — CMP14+EGFR
A/G RATIO: 1.7 (ref 1.2–2.2)
ALT: 21 IU/L (ref 0–44)
AST: 17 IU/L (ref 0–40)
Albumin: 4.5 g/dL (ref 3.5–4.8)
Alkaline Phosphatase: 54 IU/L (ref 39–117)
BILIRUBIN TOTAL: 0.7 mg/dL (ref 0.0–1.2)
BUN/Creatinine Ratio: 11 (ref 10–24)
BUN: 10 mg/dL (ref 8–27)
CALCIUM: 9.1 mg/dL (ref 8.6–10.2)
CHLORIDE: 102 mmol/L (ref 96–106)
CO2: 21 mmol/L (ref 18–29)
Creatinine, Ser: 0.87 mg/dL (ref 0.76–1.27)
GFR calc Af Amer: 97 mL/min/{1.73_m2} (ref >59)
GFR, EST NON AFRICAN AMERICAN: 84 mL/min/{1.73_m2} (ref >59)
GLOBULIN, TOTAL: 2.7 g/dL (ref 1.5–4.5)
Glucose: 108 mg/dL — ABNORMAL HIGH (ref 65–99)
POTASSIUM: 3.5 mmol/L (ref 3.5–5.2)
SODIUM: 141 mmol/L (ref 134–144)
Total Protein: 7.2 g/dL (ref 6.0–8.5)

## 2016-05-27 ENCOUNTER — Ambulatory Visit (INDEPENDENT_AMBULATORY_CARE_PROVIDER_SITE_OTHER): Payer: Commercial Managed Care - HMO

## 2016-05-27 DIAGNOSIS — E538 Deficiency of other specified B group vitamins: Secondary | ICD-10-CM | POA: Diagnosis not present

## 2016-06-27 ENCOUNTER — Ambulatory Visit (INDEPENDENT_AMBULATORY_CARE_PROVIDER_SITE_OTHER): Payer: Commercial Managed Care - HMO | Admitting: *Deleted

## 2016-06-27 DIAGNOSIS — E538 Deficiency of other specified B group vitamins: Secondary | ICD-10-CM | POA: Diagnosis not present

## 2016-06-27 NOTE — Progress Notes (Signed)
Pt given B12 injection IM right deltoid and tolerated well. 

## 2016-07-12 ENCOUNTER — Other Ambulatory Visit: Payer: Self-pay | Admitting: Family

## 2016-07-21 ENCOUNTER — Other Ambulatory Visit: Payer: Self-pay | Admitting: Family

## 2016-07-21 DIAGNOSIS — J4520 Mild intermittent asthma, uncomplicated: Secondary | ICD-10-CM

## 2016-07-27 ENCOUNTER — Other Ambulatory Visit: Payer: Self-pay | Admitting: Family

## 2016-07-27 DIAGNOSIS — I10 Essential (primary) hypertension: Secondary | ICD-10-CM

## 2016-07-29 ENCOUNTER — Ambulatory Visit (INDEPENDENT_AMBULATORY_CARE_PROVIDER_SITE_OTHER): Payer: Commercial Managed Care - HMO | Admitting: *Deleted

## 2016-07-29 DIAGNOSIS — E538 Deficiency of other specified B group vitamins: Secondary | ICD-10-CM | POA: Diagnosis not present

## 2016-07-29 NOTE — Progress Notes (Signed)
Pt given B12 injection IM left deltoid and tolerated well. °

## 2016-08-23 ENCOUNTER — Ambulatory Visit: Payer: Commercial Managed Care - HMO | Admitting: Family

## 2016-08-30 ENCOUNTER — Ambulatory Visit (INDEPENDENT_AMBULATORY_CARE_PROVIDER_SITE_OTHER): Payer: Commercial Managed Care - HMO | Admitting: *Deleted

## 2016-08-30 DIAGNOSIS — E538 Deficiency of other specified B group vitamins: Secondary | ICD-10-CM

## 2016-08-30 DIAGNOSIS — Z23 Encounter for immunization: Secondary | ICD-10-CM | POA: Diagnosis not present

## 2016-08-30 NOTE — Progress Notes (Signed)
Pt given Vit B12 an influenza vaccine Pt tolerated well

## 2016-09-30 ENCOUNTER — Ambulatory Visit (INDEPENDENT_AMBULATORY_CARE_PROVIDER_SITE_OTHER): Payer: Commercial Managed Care - HMO | Admitting: *Deleted

## 2016-09-30 DIAGNOSIS — E538 Deficiency of other specified B group vitamins: Secondary | ICD-10-CM

## 2016-09-30 NOTE — Progress Notes (Signed)
Pt given Vit B12 inj Tolerated well 

## 2016-10-03 ENCOUNTER — Other Ambulatory Visit: Payer: Self-pay | Admitting: Family

## 2016-10-03 DIAGNOSIS — M1990 Unspecified osteoarthritis, unspecified site: Secondary | ICD-10-CM

## 2016-10-25 ENCOUNTER — Ambulatory Visit (INDEPENDENT_AMBULATORY_CARE_PROVIDER_SITE_OTHER): Payer: Commercial Managed Care - HMO

## 2016-10-25 ENCOUNTER — Encounter: Payer: Self-pay | Admitting: Family

## 2016-10-25 ENCOUNTER — Ambulatory Visit (INDEPENDENT_AMBULATORY_CARE_PROVIDER_SITE_OTHER): Payer: Commercial Managed Care - HMO | Admitting: Family

## 2016-10-25 VITALS — BP 137/85 | HR 77 | Temp 96.7°F | Ht 69.0 in | Wt 197.4 lb

## 2016-10-25 DIAGNOSIS — R0602 Shortness of breath: Secondary | ICD-10-CM

## 2016-10-25 DIAGNOSIS — J209 Acute bronchitis, unspecified: Secondary | ICD-10-CM | POA: Diagnosis not present

## 2016-10-25 MED ORDER — PREDNISONE 10 MG (21) PO TBPK: ORAL_TABLET | ORAL | 0 refills | Status: DC

## 2016-10-25 MED ORDER — METHYLPREDNISOLONE ACETATE 80 MG/ML IJ SUSP
80.0000 mg | Freq: Once | INTRAMUSCULAR | Status: AC
  Administered 2016-10-25: 80 mg via INTRAMUSCULAR

## 2016-10-25 MED ORDER — LEVOFLOXACIN 500 MG PO TABS: 500.0000 mg | ORAL_TABLET | Freq: Every day | ORAL | 0 refills | Status: DC

## 2016-10-25 NOTE — Patient Instructions (Signed)

## 2016-10-25 NOTE — Progress Notes (Signed)
Subjective:    Patient ID: Casey Klein, male    DOB: 12/03/1941, 75 y.o.   MRN: [id number]  Cough  This is a new problem. The current episode started in the past 7 days. The problem has been waxing and waning. The problem occurs every few minutes. The cough is productive of sputum. Associated symptoms include ear pain, headaches, postnasal drip, rhinorrhea and wheezing. Pertinent negatives include no chills, ear congestion, fever, nasal congestion or sore throat. The symptoms are aggravated by pollens and fumes. He has tried rest and OTC cough suppressant for the symptoms. The treatment provided mild relief. His past medical history is significant for asthma.      Review of Systems  Constitutional: Negative for chills and fever.  HENT: Positive for ear pain, postnasal drip and rhinorrhea. Negative for sore throat.   Respiratory: Positive for cough and wheezing.   Neurological: Positive for headaches.  All other systems reviewed and are negative.      Objective:   Physical Exam  Constitutional: He is oriented to person, place, and time. He appears well-developed and well-nourished. No distress.  HENT:  Head: Normocephalic.  Right Ear: External ear normal.  Left Ear: External ear normal.  Nose: Mucosal edema and rhinorrhea present.  Mouth/Throat: Posterior oropharyngeal erythema present.  Nasal passage erythemas with mild swelling   Eyes: Pupils are equal, round, and reactive to light. Right eye exhibits no discharge. Left eye exhibits no discharge.  Neck: Normal range of motion. Neck supple. No thyromegaly present.  Cardiovascular: Normal rate, regular rhythm, normal heart sounds and intact distal pulses.   No murmur heard. Pulmonary/Chest: Effort normal. No respiratory distress. He has decreased breath sounds. He has no wheezes. He has rhonchi.  Abdominal: Soft. Bowel sounds are normal. He exhibits no distension. There is no tenderness.  Musculoskeletal: Normal range of  motion. He exhibits no edema or tenderness.  Neurological: He is alert and oriented to person, place, and time. He has normal reflexes. No cranial nerve deficit.  Skin: Skin is warm and dry. No rash noted. No erythema.  Psychiatric: He has a normal mood and affect. His behavior is normal. Judgment and thought content normal.  Vitals reviewed.     BP 137/85   Pulse 77   Temp (!) 96.7 F (35.9 C) (Oral)   Ht 5\' 9"  (1.753 m)   Wt 197 lb 6.4 oz (89.5 kg)   BMI 29.15 kg/m      Assessment & Plan:  1. Acute bronchitis, unspecified organism -- Take meds as prescribed - Use a cool mist humidifier  -Use saline nose sprays frequently -Saline irrigations of the nose can be very helpful if done frequently.  * 4X daily for 1 week*  * Use of a nettie pot can be helpful with this. Follow directions with this* -Force fluids -For any cough or congestion  Use plain Mucinex- regular strength or max strength is fine   * Children- consult with Pharmacist for dosing -For fever or aces or pains- take tylenol or ibuprofen appropriate for age and weight.  * for fevers greater than 101 orally you may alternate ibuprofen and tylenol every  3 hours. -Throat lozenges if help - levofloxacin (LEVAQUIN) 500 MG tablet; Take 1 tablet (500 mg total) by mouth daily.  Dispense: 7 tablet; Refill: 0 - methylPREDNISolone acetate (DEPO-MEDROL) injection 80 mg; Inject 1 mL (80 mg total) into the muscle once.  2. SOB (shortness of breath) - DG Chest 2 View; Future  Evelina Dun, FNP

## 2016-11-01 ENCOUNTER — Ambulatory Visit (INDEPENDENT_AMBULATORY_CARE_PROVIDER_SITE_OTHER): Payer: Commercial Managed Care - HMO | Admitting: *Deleted

## 2016-11-01 DIAGNOSIS — E538 Deficiency of other specified B group vitamins: Secondary | ICD-10-CM

## 2016-11-01 NOTE — Progress Notes (Signed)
Pt given Vit B12 inj Tolerated well 

## 2016-11-04 ENCOUNTER — Other Ambulatory Visit: Payer: Self-pay | Admitting: Family

## 2016-11-04 DIAGNOSIS — J209 Acute bronchitis, unspecified: Secondary | ICD-10-CM

## 2016-11-22 ENCOUNTER — Other Ambulatory Visit: Payer: Self-pay | Admitting: Family

## 2016-11-22 DIAGNOSIS — I10 Essential (primary) hypertension: Secondary | ICD-10-CM

## 2016-12-02 ENCOUNTER — Ambulatory Visit (INDEPENDENT_AMBULATORY_CARE_PROVIDER_SITE_OTHER): Payer: Commercial Managed Care - HMO | Admitting: *Deleted

## 2016-12-02 DIAGNOSIS — E538 Deficiency of other specified B group vitamins: Secondary | ICD-10-CM

## 2016-12-02 NOTE — Progress Notes (Signed)
Pt given B12 injection IM left deltoid and tolerated well. °

## 2016-12-08 ENCOUNTER — Other Ambulatory Visit: Payer: Self-pay | Admitting: *Deleted

## 2016-12-08 NOTE — Telephone Encounter (Signed)
Pt is requesting 90 day supply of meloxicam.

## 2016-12-12 MED ORDER — MELOXICAM 15 MG PO TABS: ORAL_TABLET | ORAL | 1 refills | Status: DC

## 2017-01-03 ENCOUNTER — Ambulatory Visit (INDEPENDENT_AMBULATORY_CARE_PROVIDER_SITE_OTHER): Payer: Medicare PPO | Admitting: *Deleted

## 2017-01-03 DIAGNOSIS — E538 Deficiency of other specified B group vitamins: Secondary | ICD-10-CM

## 2017-01-03 MED ORDER — CYANOCOBALAMIN 1000 MCG/ML IJ SOLN
1000.0000 ug | INTRAMUSCULAR | Status: DC
  Administered 2017-01-03 – 2018-06-13 (×17): 1000 ug via INTRAMUSCULAR

## 2017-01-03 NOTE — Progress Notes (Signed)
Pt given Vit B12 inj Tolerated well 

## 2017-01-11 ENCOUNTER — Encounter: Payer: Self-pay | Admitting: Pharmacist

## 2017-01-11 ENCOUNTER — Ambulatory Visit (INDEPENDENT_AMBULATORY_CARE_PROVIDER_SITE_OTHER): Payer: Medicare PPO | Admitting: Pharmacist

## 2017-01-11 VITALS — BP 128/62 | HR 90 | Ht 69.0 in | Wt 206.0 lb

## 2017-01-11 DIAGNOSIS — Z0000 Encounter for general adult medical examination without abnormal findings: Secondary | ICD-10-CM

## 2017-01-11 DIAGNOSIS — I10 Essential (primary) hypertension: Secondary | ICD-10-CM

## 2017-01-11 DIAGNOSIS — R7989 Other specified abnormal findings of blood chemistry: Secondary | ICD-10-CM

## 2017-01-11 NOTE — Patient Instructions (Addendum)
  Mr. Casey Klein , Thank you for taking time to come for your Medicare Wellness Visit. I appreciate your ongoing commitment to your health goals. Please review the following plan we discussed and let me know if I can assist you in the future.   These are the goals we discussed:  Look for copy of Advanced Directives - Living Will and Healthcare Power of Attorney (important to know where these are kept - you can also bring copy to our office to be placed in our file / electronic chart)  Stop naproxen / Aleve - you have prescription for meloxicam for this.   Restart taking aspirin 81mg  daily.  Increase non-starchy vegetables - carrots, green bean, squash, zucchini, tomatoes, onions, peppers, spinach and other green leafy vegetables, cabbage, lettuce, cucumbers, asparagus, okra (not fried), eggplant Limit sugar and processed foods (cakes, cookies, ice cream, crackers and chips) Increase fresh fruit but limit serving sizes 1/2 cup or about the size of tennis or baseball Limit red meat to no more than 1-2 times per week (serving size about the size of your palm) Choose whole grains / lean proteins - whole wheat bread, quinoa, whole grain rice (1/2 cup), fish, chicken, [place] Avoid sugar and calorie containing beverages - soda, sweet tea and juice.  Choose water or unsweetened tea instead.     This is a list of the screening recommended for you and due dates:  Health Maintenance  Topic Date Due  . Pneumonia vaccines (2 of 2 - PCV13) 07/27/2027*  . Tetanus Vaccine  01/26/2023  . Flu Shot  Completed  . Shingles Vaccine  Completed  *Topic was postponed. The date shown is not the original due date.

## 2017-01-11 NOTE — Progress Notes (Signed)
Patient ID: Casey Klein, male   DOB: [birth year], 76 y.o.   MRN: [id number]     Subjective:   Casey Klein is a 76 y.o. male who presents for a Subsequent Medicare Annual Wellness Visit.  Mr Shannahan lives by himself in [place], [place].  He attends church regularly and reports that he and his neighbor go out to eat together several times per week.    Patient reports that he has experienced some pain in back of head and neck over the last 1-2 months.  He has not mentioned this to PCP yet. Pain comes and goes and sometimes experiences numbness of left hand.   Takes Aleve and usually this helps. He also mentions occasional left hip pain  Also reports dizziness and increased falls  Social History: Born/Raised: [place], [place] / [place] Occupational history: [employer], retired after 61 years Marital history: widowed for 9 years Children: 1 daughter and 2 sons Alcohol/Tobacco/Substances: no    Current Medications (verified) Outpatient Encounter Prescriptions as of 01/11/2017  Medication Sig  . amLODipine (NORVASC) 10 MG tablet TAKE 1 TABLET (10 MG TOTAL) BY MOUTH DAILY.  . citalopram (CELEXA) 20 MG tablet TAKE 1 TABLET (20 MG TOTAL) BY MOUTH DAILY.  . cyanocobalamin (,VITAMIN B-12,) 1000 MCG/ML injection Inject one ML IM daily for a week and then weekly for 4 weeks then monthly.  Marland Kitchen lisinopril (PRINIVIL,ZESTRIL) 10 MG tablet Take 1 tablet (10 mg total) by mouth daily.  . meloxicam (MOBIC) 15 MG tablet TAKE 1 TABLET (15 MG TOTAL) BY MOUTH DAILY.  . montelukast (SINGULAIR) 10 MG tablet TAKE 1 TABLET (10 MG TOTAL) BY MOUTH AT BEDTIME.  . SYMBICORT 80-4.5 MCG/ACT inhaler INHALE 2 PUFFS INTO THE LUNGS 2 (TWO) TIMES DAILY.  Marland Kitchen Vitamin D, Cholecalciferol, 400 units CAPS Take 400 Units by mouth daily.  . [DISCONTINUED] naproxen sodium (ANAPROX) 220 MG tablet Take 220 mg by mouth as needed.  Marland Kitchen aspirin EC 81 MG tablet Take 1 tablet (81 mg total) by mouth daily. (Patient not taking: Reported  on 01/11/2017)  . [DISCONTINUED] levofloxacin (LEVAQUIN) 500 MG tablet Take 1 tablet (500 mg total) by mouth daily. (Patient not taking: Reported on 01/11/2017)  . [DISCONTINUED] traMADol (ULTRAM) 50 MG tablet Take 1-2 tablets (50-100 mg total) by mouth every 8 (eight) hours as needed. (Patient not taking: Reported on 01/11/2017)  . [DISCONTINUED] Vitamin D, Ergocalciferol, (DRISDOL) 50000 units CAPS capsule TAKE 1 CAPSULE (50,000 UNITS TOTAL) BY MOUTH EVERY 7 (SEVEN) DAYS. (Patient not taking: Reported on 01/11/2017)   Facility-Administered Encounter Medications as of 01/11/2017  Medication  . cyanocobalamin ((VITAMIN B-12)) injection 1,000 mcg    Allergies (verified) Pneumococcal vaccines and Penicillins   History: Past Medical History:  Diagnosis Date  . Allergy   . Asthma   . CAD (coronary artery disease)    Nonobstructive  . Carotid stenosis    50% bilateral 2012  . Cataract   . History of kidney stones   . HTN (hypertension)   . PAT (paroxysmal atrial tachycardia) ([place])    Past Surgical History:  Procedure Laterality Date  . BACK SURGERY     lumbar disc  . CATARACT EXTRACTION W/PHACO Right 05/06/2013   Procedure: CATARACT EXTRACTION PHACO AND INTRAOCULAR LENS PLACEMENT (IOC);  Surgeon: Williams Che, MD;  Location: [facility];  Service: Ophthalmology;  Laterality: Right;  CDE: 19.84  . CATARACT EXTRACTION W/PHACO Left 06/24/2013   Procedure: CATARACT EXTRACTION PHACO AND INTRAOCULAR LENS PLACEMENT (IOC);  Surgeon: Williams Che,  MD;  Location: [facility];  Service: Ophthalmology;  Laterality: Left;  CDE:  18.21  . CERVICAL DISC SURGERY    . EYE SURGERY     History reviewed. No pertinent family history. Social History   Occupational History  . Not on file.   Social History Main Topics  . Smoking status: Former Smoker    Packs/day: 1.00    Years: 10.00    Types: Cigarettes    Start date: 02/07/1983  . Smokeless tobacco: Never Used  . Alcohol use No  . Drug use: No  . Sexual  activity: No    Do you feel safe at home?  Yes Are there smokers in your home (other than you)? No  Dietary issues and exercise activities discussed: Current Exercise Habits: The patient does not participate in regular exercise at present (walks to mailbox daily - about 0.25 miles there and back), Exercise limited by: orthopedic condition(s)  Current Dietary habits:  Eats out most meals.   Breakfast - eggs, bacon or sausage  Lunch - sandwich or soup Dinner / supper - meat + vegetables Beverages - coffee and water   Cardiac Risk Factors include: advanced age (>44mn, >>23women);hypertension;male gender;sedentary lifestyle  Objective:    Today's Vitals   01/11/17 0940 01/11/17 0944  BP: 130/70 128/62  Pulse: 90   Weight: 206 lb (93.4 kg)   Height: 5' 9"  (1.753 m)   PainSc: 2    PainLoc: Neck    Body mass index is 30.42 kg/m.   Activities of Daily Living In your present state of health, do you have any difficulty performing the following activities: 01/11/2017  Hearing? N  Vision? N  Difficulty concentrating or making decisions? Y  Walking or climbing stairs? Y  Dressing or bathing? N  Doing errands, shopping? N  Preparing Food and eating ? N  Using the Toilet? N  In the past six months, have you accidently leaked urine? N  Do you have problems with loss of bowel control? N  Managing your Medications? N  Managing your Finances? N  Housekeeping or managing your Housekeeping? N  Some recent data might be hidden     Depression Screen PHQ 2/9 Scores 01/11/2017 10/25/2016 05/17/2016 01/28/2016  PHQ - 2 Score 2 0 0 1  PHQ- 9 Score 5 - - -     Fall Risk Fall Risk  01/11/2017 10/25/2016 05/17/2016 01/28/2016 08/14/2015  Falls in the past year? Yes No No Yes Yes  Number falls in past yr: 2 or more - - 2 or more 2 or more  Injury with Fall? No - - Yes No  Risk Factor Category  High Fall Risk - - - -  Risk for fall due to : History of fall(s);Impaired balance/gait - - - -  Follow  up Falls prevention discussed;Falls evaluation completed;Follow up appointment - - - -    Cognitive Function: MMSE - Mini Mental State Exam 01/11/2017 08/14/2015  Orientation to time 5 5  Orientation to Place 5 5  Registration 3 3  Attention/ Calculation 5 0  Recall 3 3  Language- name 2 objects 2 2  Language- repeat 1 1  Language- follow 3 step command 3 3  Language- read & follow direction 1 1  Write a sentence 1 1  Copy design 1 1  Total score 30 25    Immunizations and Health Maintenance Immunization History  Administered Date(s) Administered  . Influenza, High Dose Seasonal PF 09/06/2013, 08/30/2016  . Influenza,inj,Quad PF,36+  Mos 09/17/2014, 09/01/2015  . Pneumococcal-Unspecified 09/06/2013  . Zoster 05/17/2016   There are no preventive care reminders to display for this patient.  Patient Care Team: Sharion Balloon, FNP as PCP - General (Nurse Practitioner) Minus Breeding, MD as Consulting Physician (Cardiology)  Indicate any recent Medical Services you may have received from other than [facility] providers in the past year (date may be approximate).    Assessment:    Annual Wellness Visit  Neck and hip pain High fall risk HTN - controlled and no postural hypotension noted today Low serum vitamin D - last checked over 1 year ago    Screening Tests Health Maintenance  Topic Date Due  . PNA vac Low Risk Adult (2 of 2 - PCV13) 07/27/2027 (Originally 09/06/2014)  . TETANUS/TDAP  01/26/2023  . INFLUENZA VACCINE  Completed  . ZOSTAVAX  Completed        Plan:   During the course of the visit Rayshon was educated and counseled about the following appropriate screening and preventive services:   Vaccines to include Pneumoccal, Influenza,  Td, Zostavax - patient had past reaction to pneumonia vaccine; declined Tdap today  Colorectal cancer screening - due to age no longer required if not GI problems  Cardiovascular disease screening - UTD; sees Dr Jenkins Rouge  yearly  Diabetes screening - checking BG today - pending  Glaucoma screening /  Eye Exam - has been several years - reminded patient to have eye exam  Nutrition counseling - discussed limiting high fat foods and increasing lean proteins like fish, chicken and [place].  Increase fruit and vegetables intake  Prostate cancer screening - no longer required due to patient age  Advanced Directives  Requested copy for chart  Physical Activity - continue to walk to mailbox daily.    RTC for evaluation for neck and hip pain - appt made with Dr Wendi Snipes since his PCP did not have any opening for over 2 weeks  Stop Aleve since he has meloxicam  Restart ASA 57m qd - history of atrial tachycardia  Fall prevention discussed - after evaluation for hip and neck pain may consider PT referral  Orders Placed This Encounter  Procedures  . CMP14+EGFR  . VITAMIN D 25 Hydroxy (Vit-D Deficiency, Fractures)     Patient Instructions (the written plan) were given to the patient.   TCherre Robins PharmD   01/11/2017

## 2017-01-12 ENCOUNTER — Other Ambulatory Visit: Payer: Self-pay | Admitting: Pharmacist

## 2017-01-12 LAB — CMP14+EGFR
ALBUMIN: 4.3 g/dL (ref 3.5–4.8)
ALT: 25 IU/L (ref 0–44)
AST: 21 IU/L (ref 0–40)
Albumin/Globulin Ratio: 1.7 (ref 1.2–2.2)
Alkaline Phosphatase: 45 IU/L (ref 39–117)
BUN / CREAT RATIO: 15 (ref 10–24)
BUN: 14 mg/dL (ref 8–27)
Bilirubin Total: 0.6 mg/dL (ref 0.0–1.2)
CO2: 22 mmol/L (ref 18–29)
CREATININE: 0.93 mg/dL (ref 0.76–1.27)
Calcium: 8.9 mg/dL (ref 8.6–10.2)
Chloride: 105 mmol/L (ref 96–106)
GFR, EST AFRICAN AMERICAN: 91 mL/min/{1.73_m2} (ref >59)
GFR, EST NON AFRICAN AMERICAN: 79 mL/min/{1.73_m2} (ref >59)
GLUCOSE: 121 mg/dL — AB (ref 65–99)
Globulin, Total: 2.5 g/dL (ref 1.5–4.5)
Potassium: 3.9 mmol/L (ref 3.5–5.2)
Sodium: 141 mmol/L (ref 134–144)
TOTAL PROTEIN: 6.8 g/dL (ref 6.0–8.5)

## 2017-01-12 LAB — VITAMIN D 25 HYDROXY (VIT D DEFICIENCY, FRACTURES): Vit D, 25-Hydroxy: 19 ng/mL — ABNORMAL LOW (ref 30.0–100.0)

## 2017-01-12 MED ORDER — VITAMIN D (ERGOCALCIFEROL) 1.25 MG (50000 UNIT) PO CAPS: 50000.0000 [IU] | ORAL_CAPSULE | ORAL | 0 refills | Status: DC

## 2017-01-13 ENCOUNTER — Encounter: Payer: Self-pay | Admitting: Family Medicine

## 2017-01-13 ENCOUNTER — Ambulatory Visit (INDEPENDENT_AMBULATORY_CARE_PROVIDER_SITE_OTHER): Payer: Medicare PPO | Admitting: Family Medicine

## 2017-01-13 VITALS — BP 130/75 | HR 77 | Temp 95.0°F | Ht 69.0 in | Wt 203.4 lb

## 2017-01-13 DIAGNOSIS — R51 Headache: Secondary | ICD-10-CM

## 2017-01-13 DIAGNOSIS — R519 Headache, unspecified: Secondary | ICD-10-CM

## 2017-01-13 MED ORDER — SUMATRIPTAN SUCCINATE 100 MG PO TABS: 50.0000 mg | ORAL_TABLET | ORAL | 0 refills | Status: DC | PRN

## 2017-01-13 NOTE — Patient Instructions (Signed)
Great to see you!   

## 2017-01-13 NOTE — Progress Notes (Signed)
   HPI  Patient presents today with headache.  Patient states had this headache off and on for years.   Describes left-sided postauricular headache, pain is dull and achy and radiating down to his left neck and 4 to the left frontal part of his head. He does have photophobia and occasional nausea with the headaches. This is similar to previous migraine headaches.  He has done very well for years with Aleve for this.  After lengthy discussion it turns out that Tylenol also gives a very good relief from headaches  PMH: Smoking status noted ROS: Per HPI  Objective: BP 130/75   Pulse 77   Temp (!) 95 F (35 C) (Oral)   Ht 5\' 9"  (1.753 m)   Wt 203 lb 6.4 oz (92.3 kg)   BMI 30.04 kg/m  Gen: NAD, alert, cooperative with exam HEENT: NCAT, EOMI, PERRL CV: RRR, good S1/S2, no murmur Resp: CTABL, no wheezes, non-laboredy Ext: No edema, warm Neuro: Alert and oriented, strength 5/5 and sensation intact in all 4 extremities, cranial nerves II through XII intact  Assessment and plan:  # headache Most likely migraine headache with onset several years ago in approximately 3-4 headaches a month. Recommend using Tylenol I was going to consider Imitrex, however he has a history of CAD and this could cause severe complications. Prescription was sent and then called and canceled. Recommended no NSAIDs.  The recommended seeing a neurologist considering his age and unique distribution of headache, the patient declines this. His neurologic exam is reassuring.    Murtis SinkSam Adamson, MD [facility] 01/13/2017, 11:28 AM

## 2017-02-01 ENCOUNTER — Ambulatory Visit (INDEPENDENT_AMBULATORY_CARE_PROVIDER_SITE_OTHER): Payer: Medicare PPO | Admitting: *Deleted

## 2017-02-01 DIAGNOSIS — E538 Deficiency of other specified B group vitamins: Secondary | ICD-10-CM

## 2017-02-01 NOTE — Progress Notes (Signed)
Pt given Vit B12 Tolerated well 

## 2017-02-27 ENCOUNTER — Other Ambulatory Visit: Payer: Self-pay | Admitting: Family

## 2017-02-27 DIAGNOSIS — I10 Essential (primary) hypertension: Secondary | ICD-10-CM

## 2017-03-02 ENCOUNTER — Ambulatory Visit (INDEPENDENT_AMBULATORY_CARE_PROVIDER_SITE_OTHER): Payer: Medicare PPO | Admitting: *Deleted

## 2017-03-02 DIAGNOSIS — E538 Deficiency of other specified B group vitamins: Secondary | ICD-10-CM

## 2017-03-02 NOTE — Patient Instructions (Signed)
Cyanocobalamin, Vitamin B12 injection What is this medicine? CYANOCOBALAMIN (sye an oh koe BAL a min) is a man made form of vitamin B12. Vitamin B12 is used in the growth of healthy blood cells, nerve cells, and proteins in the body. It also helps with the metabolism of fats and carbohydrates. This medicine is used to treat people who can not absorb vitamin B12. This medicine may be used for other purposes; ask your health care provider or pharmacist if you have questions. COMMON BRAND NAME(S): B-12 Compliance Kit, B-12 Injection Kit, Cyomin, LA-12, Nutri-Twelve, Physicians EZ Use B-12, Primabalt What should I tell my health care provider before I take this medicine? They need to know if you have any of these conditions: -kidney disease -Leber's disease -megaloblastic anemia -an unusual or allergic reaction to cyanocobalamin, cobalt, other medicines, foods, dyes, or preservatives -pregnant or trying to get pregnant -breast-feeding How should I use this medicine? This medicine is injected into a muscle or deeply under the skin. It is usually given by a health care professional in a clinic or doctor's office. However, your doctor may teach you how to inject yourself. Follow all instructions. Talk to your pediatrician regarding the use of this medicine in children. Special care may be needed. Overdosage: If you think you have taken too much of this medicine contact a poison control center or emergency room at once. NOTE: This medicine is only for you. Do not share this medicine with others. What if I miss a dose? If you are given your dose at a clinic or doctor's office, call to reschedule your appointment. If you give your own injections and you miss a dose, take it as soon as you can. If it is almost time for your next dose, take only that dose. Do not take double or extra doses. What may interact with this medicine? -colchicine -heavy alcohol intake This list may not describe all possible  interactions. Give your health care provider a list of all the medicines, herbs, non-prescription drugs, or dietary supplements you use. Also tell them if you smoke, drink alcohol, or use illegal drugs. Some items may interact with your medicine. What should I watch for while using this medicine? Visit your doctor or health care professional regularly. You may need blood work done while you are taking this medicine. You may need to follow a special diet. Talk to your doctor. Limit your alcohol intake and avoid smoking to get the best benefit. What side effects may I notice from receiving this medicine? Side effects that you should report to your doctor or health care professional as soon as possible: -allergic reactions like skin rash, itching or hives, swelling of the face, lips, or tongue -blue tint to skin -chest tightness, pain -difficulty breathing, wheezing -dizziness -red, swollen painful area on the leg Side effects that usually do not require medical attention (report to your doctor or health care professional if they continue or are bothersome): -diarrhea -headache This list may not describe all possible side effects. Call your doctor for medical advice about side effects. You may report side effects to FDA at 1-800-FDA-1088. Where should I keep my medicine? Keep out of the reach of children. Store at room temperature between 15 and 30 degrees C (59 and 85 degrees F). Protect from light. Throw away any unused medicine after the expiration date. NOTE: This sheet is a summary. It may not cover all possible information. If you have questions about this medicine, talk to your doctor, pharmacist, or   health care provider.  2018 Elsevier/Gold Standard (2008-03-03 22:10:20)  

## 2017-03-02 NOTE — Progress Notes (Signed)
Vitamin b12 injection given and tolerated well.  

## 2017-03-03 ENCOUNTER — Other Ambulatory Visit: Payer: Self-pay | Admitting: Family

## 2017-03-03 DIAGNOSIS — I2510 Atherosclerotic heart disease of native coronary artery without angina pectoris: Secondary | ICD-10-CM

## 2017-03-03 DIAGNOSIS — I10 Essential (primary) hypertension: Secondary | ICD-10-CM

## 2017-04-03 ENCOUNTER — Ambulatory Visit (INDEPENDENT_AMBULATORY_CARE_PROVIDER_SITE_OTHER): Payer: Medicare PPO | Admitting: *Deleted

## 2017-04-03 ENCOUNTER — Other Ambulatory Visit: Payer: Self-pay | Admitting: Pharmacist

## 2017-04-03 DIAGNOSIS — E538 Deficiency of other specified B group vitamins: Secondary | ICD-10-CM | POA: Diagnosis not present

## 2017-04-03 NOTE — Progress Notes (Signed)
Pt given Vit B12 inj Tolerated well 

## 2017-05-04 ENCOUNTER — Ambulatory Visit (INDEPENDENT_AMBULATORY_CARE_PROVIDER_SITE_OTHER): Payer: Medicare PPO | Admitting: *Deleted

## 2017-05-04 DIAGNOSIS — E538 Deficiency of other specified B group vitamins: Secondary | ICD-10-CM

## 2017-05-04 NOTE — Progress Notes (Signed)
Pt given Vit B12 inj Tolerated well 

## 2017-06-02 ENCOUNTER — Other Ambulatory Visit: Payer: Self-pay | Admitting: Family

## 2017-06-02 DIAGNOSIS — I10 Essential (primary) hypertension: Secondary | ICD-10-CM

## 2017-06-02 DIAGNOSIS — F32A Depression, unspecified: Secondary | ICD-10-CM

## 2017-06-02 DIAGNOSIS — F411 Generalized anxiety disorder: Secondary | ICD-10-CM

## 2017-06-02 DIAGNOSIS — I2510 Atherosclerotic heart disease of native coronary artery without angina pectoris: Secondary | ICD-10-CM

## 2017-06-02 DIAGNOSIS — F329 Major depressive disorder, single episode, unspecified: Secondary | ICD-10-CM

## 2017-06-05 ENCOUNTER — Ambulatory Visit (INDEPENDENT_AMBULATORY_CARE_PROVIDER_SITE_OTHER): Payer: Medicare PPO

## 2017-06-05 DIAGNOSIS — E538 Deficiency of other specified B group vitamins: Secondary | ICD-10-CM

## 2017-06-05 NOTE — Progress Notes (Signed)
Patient given B-12 injection to left deltoid, tolerated well.

## 2017-06-05 NOTE — Patient Instructions (Signed)
Cyanocobalamin, Vitamin B12 injection What is this medicine? CYANOCOBALAMIN (sye an oh koe BAL a min) is a man made form of vitamin B12. Vitamin B12 is used in the growth of healthy blood cells, nerve cells, and proteins in the body. It also helps with the metabolism of fats and carbohydrates. This medicine is used to treat people who can not absorb vitamin B12. This medicine may be used for other purposes; ask your health care provider or pharmacist if you have questions. COMMON BRAND NAME(S): B-12 Compliance Kit, B-12 Injection Kit, Cyomin, LA-12, Nutri-Twelve, Physicians EZ Use B-12, Primabalt What should I tell my health care provider before I take this medicine? They need to know if you have any of these conditions: -kidney disease -Leber's disease -megaloblastic anemia -an unusual or allergic reaction to cyanocobalamin, cobalt, other medicines, foods, dyes, or preservatives -pregnant or trying to get pregnant -breast-feeding How should I use this medicine? This medicine is injected into a muscle or deeply under the skin. It is usually given by a health care professional in a clinic or doctor's office. However, your doctor may teach you how to inject yourself. Follow all instructions. Talk to your pediatrician regarding the use of this medicine in children. Special care may be needed. Overdosage: If you think you have taken too much of this medicine contact a poison control center or emergency room at once. NOTE: This medicine is only for you. Do not share this medicine with others. What if I miss a dose? If you are given your dose at a clinic or doctor's office, call to reschedule your appointment. If you give your own injections and you miss a dose, take it as soon as you can. If it is almost time for your next dose, take only that dose. Do not take double or extra doses. What may interact with this medicine? -colchicine -heavy alcohol intake This list may not describe all possible  interactions. Give your health care provider a list of all the medicines, herbs, non-prescription drugs, or dietary supplements you use. Also tell them if you smoke, drink alcohol, or use illegal drugs. Some items may interact with your medicine. What should I watch for while using this medicine? Visit your doctor or health care professional regularly. You may need blood work done while you are taking this medicine. You may need to follow a special diet. Talk to your doctor. Limit your alcohol intake and avoid smoking to get the best benefit. What side effects may I notice from receiving this medicine? Side effects that you should report to your doctor or health care professional as soon as possible: -allergic reactions like skin rash, itching or hives, swelling of the face, lips, or tongue -blue tint to skin -chest tightness, pain -difficulty breathing, wheezing -dizziness -red, swollen painful area on the leg Side effects that usually do not require medical attention (report to your doctor or health care professional if they continue or are bothersome): -diarrhea -headache This list may not describe all possible side effects. Call your doctor for medical advice about side effects. You may report side effects to FDA at 1-800-FDA-1088. Where should I keep my medicine? Keep out of the reach of children. Store at room temperature between 15 and 30 degrees C (59 and 85 degrees F). Protect from light. Throw away any unused medicine after the expiration date. NOTE: This sheet is a summary. It may not cover all possible information. If you have questions about this medicine, talk to your doctor, pharmacist, or  health care provider.  2018 Elsevier/Gold Standard (2008-03-03 22:10:20)  

## 2017-07-07 ENCOUNTER — Other Ambulatory Visit: Payer: Self-pay | Admitting: Pharmacist

## 2017-07-07 ENCOUNTER — Ambulatory Visit (INDEPENDENT_AMBULATORY_CARE_PROVIDER_SITE_OTHER): Payer: Medicare PPO | Admitting: *Deleted

## 2017-07-07 ENCOUNTER — Ambulatory Visit (INDEPENDENT_AMBULATORY_CARE_PROVIDER_SITE_OTHER): Payer: Medicare PPO | Admitting: Family Medicine

## 2017-07-07 ENCOUNTER — Other Ambulatory Visit: Payer: Self-pay | Admitting: Family

## 2017-07-07 ENCOUNTER — Encounter: Payer: Self-pay | Admitting: Family Medicine

## 2017-07-07 VITALS — BP 135/76 | HR 85 | Temp 96.8°F | Ht 69.0 in | Wt 199.0 lb

## 2017-07-07 DIAGNOSIS — E538 Deficiency of other specified B group vitamins: Secondary | ICD-10-CM | POA: Diagnosis not present

## 2017-07-07 DIAGNOSIS — I200 Unstable angina: Secondary | ICD-10-CM | POA: Diagnosis not present

## 2017-07-07 NOTE — Progress Notes (Signed)
Pt given Cyanocobalamin inj Tolerated well 

## 2017-07-07 NOTE — Progress Notes (Signed)
BP 135/76   Pulse 85   Temp (!) 96.8 F (36 C) (Oral)   Ht 5\' 9"  (1.753 m)   Wt 199 lb (90.3 kg)   SpO2 96%   BMI 29.39 kg/m    Subjective:    Patient ID: Casey Klein, male    DOB: [birth year], 76 y.o.   MRN: [id number]  HPI: Casey Klein is a 76 y.o. male presenting on 07/07/2017 for Shortness of Breath; numbness in left arm; and pain in chest radiating to neck   HPI Shortness of breath and chest pain Patient has left-sided chest pain that goes up into the left side of his neck that last about 2-3 minutes and is mostly with exertion. He did feel short of breath when it happened. Each time it just lasted a short amount of time it has happened at least 3 or 4 times over the past week. Patient does have a cardiologist in Dr. Sander RadonHochran who he sees infrequently and has been diagnosed with CAD without blockage sufficient to require a stent in the past. Patient does have an extensive smoking history and has COPD as well. He says right now in office he is not having the chest pain or shortness of breath or pain going up in the left side of his neck. He is also not having any more numbness in the left arm than he usually does which is due to a flu vaccine reaction that caused the numbness in his arm in the past.  Relevant past medical, surgical, family and social history reviewed and updated as indicated. Interim medical history since our last visit reviewed. Allergies and medications reviewed and updated.  Review of Systems  Constitutional: Negative for chills and fever.  HENT: Negative for congestion.   Respiratory: Positive for chest tightness and shortness of breath. Negative for cough and wheezing.   Cardiovascular: Positive for chest pain. Negative for palpitations and leg swelling.  Gastrointestinal: Negative for abdominal pain.  Musculoskeletal: Negative for back pain and gait problem.  Skin: Negative for rash.  Neurological: Negative for dizziness, light-headedness and  headaches.  All other systems reviewed and are negative.   Per HPI unless specifically indicated above        Objective:    BP 135/76   Pulse 85   Temp (!) 96.8 F (36 C) (Oral)   Ht 5\' 9"  (1.753 m)   Wt 199 lb (90.3 kg)   SpO2 96%   BMI 29.39 kg/m   Wt Readings from Last 3 Encounters:  07/07/17 199 lb (90.3 kg)  01/13/17 203 lb 6.4 oz (92.3 kg)  01/11/17 206 lb (93.4 kg)    Physical Exam  Constitutional: He is oriented to person, place, and time. He appears well-developed and well-nourished. No distress.  Eyes: Conjunctivae are normal. No scleral icterus.  Neck: Neck supple. No thyromegaly present.  Cardiovascular: Normal rate, normal heart sounds and intact distal pulses.  An irregularly irregular rhythm present.  No murmur heard. Pulmonary/Chest: Effort normal and breath sounds normal. No respiratory distress. He has no wheezes. He has no rales.  Musculoskeletal: Normal range of motion. He exhibits no edema.  Lymphadenopathy:    He has no cervical adenopathy.  Neurological: He is alert and oriented to person, place, and time. Coordination normal.  Skin: Skin is warm and dry. No rash noted. He is not diaphoretic.  Psychiatric: He has a normal mood and affect. His behavior is normal.  Nursing note and vitals reviewed.   EKG:  Patient has recurrent PACs with right atrial enlargement and sinus rhythm.    Assessment & Plan:   Problem List Items Addressed This Visit    None    Visit Diagnoses    Unstable angina (HCC)    -  Primary   Patient has been feeling chest pains that go up into his neck at rest and activity, last 2-3 minutes also short of breath, calling cardiology for appointment   Relevant Orders   EKG 12-Lead (Completed)      Patient is asymptomatic currently today, will put out a call to his cardiology office to see if we can get him in next week and warned him of the signs of what to watch for Bonita QuinLinda go to the emergency department if he has chest  pressure that lasts more than 20 minutes of shortness of breath that last more than 20 minutes at rest and needs to go directly to the emergency department and not wait.  Follow up plan: Return if symptoms worsen or fail to improve.  Counseling provided for all of the vaccine components Orders Placed This Encounter  Procedures  . EKG 12-Lead    Arville CareJoshua Dettinger, MD [facility] 07/07/2017, 4:13 PM

## 2017-07-08 ENCOUNTER — Ambulatory Visit: Payer: Medicare PPO

## 2017-07-10 NOTE — Progress Notes (Addendum)
HPI The patient presents for evaluation of  neck and arm pain. He had a past history of nonobstructive coronary disease with 30% LAD stenosis. He has some moderate peripheral vascular disease with carotid stenosis. He says that he's been getting left upper chest discomfort for a few months. This seems to be relatively frequent although he can't really quantify or qualify it. He says it's a fleeting discomfort. He describes some discomfort going up to the left side of his neck and down his left arm. It seems to happen unprovoked. He does not describe associated symptoms such as nausea vomiting or diaphoresis. He doesn't report palpitations, presyncope or syncope. He does get short of breath walking uphill from his mailbox and this sounds like it's about 50 yards. He's not describing any PND or orthopnea. He saw his primary provider on third and had no acute EKG changes but said up to see me because of these complaints.   Allergies  Allergen Reactions  . Pneumococcal Vaccines     Pt [facility] and arm swelled "huge with a lot swelling"   . Penicillins Other (See Comments)    unknown    Current Outpatient Prescriptions  Medication Sig Dispense Refill  . amLODipine (NORVASC) 10 MG tablet TAKE 1 TABLET (10 MG TOTAL) BY MOUTH DAILY. 90 tablet 1  . aspirin EC 81 MG tablet Take 1 tablet (81 mg total) by mouth daily. 90 tablet 1  . citalopram (CELEXA) 20 MG tablet TAKE 1 TABLET (20 MG TOTAL) BY MOUTH DAILY. 90 tablet 0  . cyanocobalamin (,VITAMIN B-12,) 1000 MCG/ML injection Inject one ML IM daily for a week and then weekly for 4 weeks then monthly. 10 mL 3  . lisinopril (PRINIVIL,ZESTRIL) 10 MG tablet TAKE 1 TABLET (10 MG TOTAL) BY MOUTH DAILY. 90 tablet 0  . meloxicam (MOBIC) 15 MG tablet TAKE 1 TABLET (15 MG TOTAL) BY MOUTH DAILY. 90 tablet 1  . montelukast (SINGULAIR) 10 MG tablet TAKE 1 TABLET (10 MG TOTAL) BY MOUTH AT BEDTIME. 90 tablet 4  . SYMBICORT  80-4.5 MCG/ACT inhaler INHALE 2 PUFFS INTO THE LUNGS 2 (TWO) TIMES DAILY. 10.2 Inhaler 5  . Vitamin D, Ergocalciferol, (DRISDOL) 50000 units CAPS capsule TAKE 1 CAPSULE (50,000 UNITS TOTAL) BY MOUTH EVERY 7 (SEVEN) DAYS. 12 capsule 0  . Vitamin D, Ergocalciferol, (DRISDOL) 50000 units CAPS capsule TAKE 1 CAPSULE (50,000 UNITS TOTAL) BY MOUTH EVERY 7 (SEVEN) DAYS. 12 capsule 0   Current Facility-Administered Medications  Medication Dose Route Frequency Provider Last Rate Last Dose  . cyanocobalamin ((VITAMIN B-12)) injection 1,000 mcg  1,000 mcg Intramuscular Q30 days Junie SpencerHawks, [name] A, FNP   1,000 mcg at 07/07/17 [id number]   Past Medical History:  Diagnosis Date  . Allergy   . Asthma   . CAD (coronary artery disease)    Nonobstructive  . Carotid stenosis    50% bilateral 2012  . Cataract   . History of kidney stones   . HTN (hypertension)   . PAT (paroxysmal atrial tachycardia) (HCC)    Past Surgical History:  Procedure Laterality Date  . BACK SURGERY     lumbar disc  . CATARACT EXTRACTION W/PHACO Right 05/06/2013   Procedure: CATARACT EXTRACTION PHACO AND INTRAOCULAR LENS PLACEMENT (IOC);  Surgeon: Susa Simmondsarroll F Haines, MD;  Location: [facility];  Service: Ophthalmology;  Laterality: Right;  CDE: 19.84  . CATARACT EXTRACTION W/PHACO Left 06/24/2013   Procedure: CATARACT EXTRACTION PHACO AND INTRAOCULAR LENS PLACEMENT (IOC);  Surgeon: Susa Simmonds, MD;  Location: [facility];  Service: Ophthalmology;  Laterality: Left;  CDE:  18.21  . CERVICAL DISC SURGERY    . EYE SURGERY      ROS:  As stated in the HPI and negative for all other systems.    PHYSICAL EXAM BP (!) 144/89   Pulse 81   Ht 5\' 9"  (1.753 m)   Wt 197 lb (89.4 kg)   BMI 29.09 kg/m   GENERAL:  Well appearing NECK:  No jugular venous distention, waveform within normal limits, carotid upstroke brisk and symmetric, no bruits, no thyromegaly LUNGS:  Clear to auscultation bilaterally BACK:  No CVA tenderness CHEST:   Unremarkable HEART:  PMI not displaced or sustained,S1 and S2 within normal limits, no S3, no S4, no clicks, no rubs, soft apical early peaking systolic murmur, no diastolic murmurs ABD:  Flat, positive bowel sounds normal in frequency in pitch, no bruits, no rebound, no guarding, no midline pulsatile mass, no hepatomegaly, no splenomegaly EXT:  2 plus pulses throughout, no edema, no cyanosis no clubbing   EKG:  Sinus rhythm, rate 81, axis within normal limits, intervals within normal limits, no acute ST-T wave changes.  07/07/17   ASSESSMENT AND PLAN  CHEST PAIN:   The patient's chest pain has some typical other atypical features. This could be unstable angina. I think the pretest probability is moderate to low. I like to perform a stress test but he would not be a walk on a treadmill. Therefore, he will have a [employer].  HTN:  The blood pressure is slightly elevated but this is unusual. He will continue on the meds as listed and keep an eye on this.  CAROTID STENOSIS:  He had 40-59% right stenosis and is overdue for follow-up. He is overdue for follow-up.  I will arrange this.   MURMUR:   He had no significant abnormalities on echo last year.  No further work up is planned.

## 2017-07-10 NOTE — Telephone Encounter (Signed)
Last Vit D 01/11/17  19.0

## 2017-07-11 ENCOUNTER — Encounter: Payer: Self-pay | Admitting: Cardiology

## 2017-07-11 ENCOUNTER — Ambulatory Visit (INDEPENDENT_AMBULATORY_CARE_PROVIDER_SITE_OTHER): Payer: Medicare PPO | Admitting: Cardiology

## 2017-07-11 VITALS — BP 144/89 | HR 81 | Ht 69.0 in | Wt 197.0 lb

## 2017-07-11 DIAGNOSIS — E785 Hyperlipidemia, unspecified: Secondary | ICD-10-CM | POA: Diagnosis not present

## 2017-07-11 DIAGNOSIS — I200 Unstable angina: Secondary | ICD-10-CM | POA: Diagnosis not present

## 2017-07-11 DIAGNOSIS — I6523 Occlusion and stenosis of bilateral carotid arteries: Secondary | ICD-10-CM | POA: Diagnosis not present

## 2017-07-11 NOTE — Patient Instructions (Signed)
Medication Instructions:  Continue current medication  If you need a refill on your cardiac medications before your next appointment, please call your pharmacy.  Labwork: Fasting Lipids  Testing/Procedures: Your physician has requested that you have a lexiscan myoview. For further information please visit [email or web address]. Please follow instruction sheet, as given.  Your physician has requested that you have a carotid duplex. This test is an ultrasound of the carotid arteries in your neck. It looks at blood flow through these arteries that supply the brain with blood. Allow one hour for this exam. There are no restrictions or special instructions.  Follow-Up: Your physician wants you to follow-up in: 1 Year. You should receive a reminder letter in the mail two months in advance. If you do not receive a letter, please call our office [phone].     [facility] you [facility] choosing [facility]!!

## 2017-07-12 ENCOUNTER — Encounter: Payer: Self-pay | Admitting: Cardiology

## 2017-07-12 DIAGNOSIS — E785 Hyperlipidemia, unspecified: Secondary | ICD-10-CM | POA: Insufficient documentation

## 2017-07-12 DIAGNOSIS — I6523 Occlusion and stenosis of bilateral carotid arteries: Secondary | ICD-10-CM | POA: Insufficient documentation

## 2017-07-12 DIAGNOSIS — I200 Unstable angina: Secondary | ICD-10-CM | POA: Insufficient documentation

## 2017-07-14 ENCOUNTER — Other Ambulatory Visit: Payer: Self-pay | Admitting: Family

## 2017-07-21 ENCOUNTER — Telehealth: Payer: Self-pay | Admitting: Cardiology

## 2017-07-21 NOTE — Telephone Encounter (Signed)
New message      Calling to let Dr Antoine Poche know that Dr Luisa Hart has approved the stat request for a nuclear cardiolite

## 2017-07-25 ENCOUNTER — Encounter (HOSPITAL_COMMUNITY): Payer: Medicare PPO

## 2017-07-25 ENCOUNTER — Inpatient Hospital Stay (HOSPITAL_COMMUNITY): Admission: RE | Admit: 2017-07-25 | Payer: Medicare PPO | Source: Ambulatory Visit

## 2017-07-26 ENCOUNTER — Ambulatory Visit (HOSPITAL_COMMUNITY)
Admission: RE | Admit: 2017-07-26 | Discharge: 2017-07-26 | Disposition: A | Discharge status: Home / Self Care | Payer: Medicare PPO | Source: Ambulatory Visit | Attending: Internal Medicine | Admitting: Internal Medicine

## 2017-07-26 DIAGNOSIS — M79603 Pain in arm, unspecified: Secondary | ICD-10-CM | POA: Diagnosis not present

## 2017-07-26 DIAGNOSIS — E663 Overweight: Secondary | ICD-10-CM | POA: Insufficient documentation

## 2017-07-26 DIAGNOSIS — I200 Unstable angina: Secondary | ICD-10-CM

## 2017-07-26 DIAGNOSIS — I6523 Occlusion and stenosis of bilateral carotid arteries: Secondary | ICD-10-CM | POA: Diagnosis not present

## 2017-07-26 DIAGNOSIS — J449 Chronic obstructive pulmonary disease, unspecified: Secondary | ICD-10-CM | POA: Insufficient documentation

## 2017-07-26 DIAGNOSIS — Z6829 Body mass index (BMI) 29.0-29.9, adult: Secondary | ICD-10-CM | POA: Insufficient documentation

## 2017-07-26 DIAGNOSIS — I10 Essential (primary) hypertension: Secondary | ICD-10-CM | POA: Diagnosis not present

## 2017-07-26 DIAGNOSIS — R0609 Other forms of dyspnea: Secondary | ICD-10-CM | POA: Diagnosis not present

## 2017-07-26 DIAGNOSIS — Z87891 Personal history of nicotine dependence: Secondary | ICD-10-CM | POA: Diagnosis not present

## 2017-07-26 DIAGNOSIS — I25110 Atherosclerotic heart disease of native coronary artery with unstable angina pectoris: Secondary | ICD-10-CM | POA: Insufficient documentation

## 2017-07-26 DIAGNOSIS — R079 Chest pain, unspecified: Secondary | ICD-10-CM | POA: Insufficient documentation

## 2017-07-26 LAB — MYOCARDIAL PERFUSION IMAGING
CHL CUP RESTING HR STRESS: 68 {beats}/min
CSEPPHR: 91 {beats}/min
LV sys vol: 44 mL
LVDIAVOL: 112 mL (ref 62–150)
SDS: 0
SRS: 4
SSS: 4
TID: 1.33

## 2017-07-26 MED ORDER — TECHNETIUM TC 99M TETROFOSMIN IV KIT
10.5000 | PACK | Freq: Once | INTRAVENOUS | Status: AC | PRN
  Administered 2017-07-26: 10.5 via INTRAVENOUS
  Filled 2017-07-26: qty 11

## 2017-07-26 MED ORDER — TECHNETIUM TC 99M TETROFOSMIN IV KIT
31.5000 | PACK | Freq: Once | INTRAVENOUS | Status: AC | PRN
  Administered 2017-07-26: 31.5 via INTRAVENOUS
  Filled 2017-07-26: qty 32

## 2017-07-26 MED ORDER — REGADENOSON 0.4 MG/5ML IV SOLN
0.4000 mg | Freq: Once | INTRAVENOUS | Status: AC
  Administered 2017-07-26: 0.4 mg via INTRAVENOUS

## 2017-08-09 ENCOUNTER — Ambulatory Visit (INDEPENDENT_AMBULATORY_CARE_PROVIDER_SITE_OTHER): Payer: Medicare PPO | Admitting: *Deleted

## 2017-08-09 DIAGNOSIS — E538 Deficiency of other specified B group vitamins: Secondary | ICD-10-CM | POA: Diagnosis not present

## 2017-08-09 NOTE — Progress Notes (Signed)
Pt given Vit B12 inj Tolerated well 

## 2017-08-31 ENCOUNTER — Other Ambulatory Visit: Payer: Self-pay | Admitting: Family

## 2017-08-31 DIAGNOSIS — I10 Essential (primary) hypertension: Secondary | ICD-10-CM

## 2017-09-02 ENCOUNTER — Other Ambulatory Visit: Payer: Self-pay | Admitting: Family

## 2017-09-02 DIAGNOSIS — I10 Essential (primary) hypertension: Secondary | ICD-10-CM

## 2017-09-02 DIAGNOSIS — I2510 Atherosclerotic heart disease of native coronary artery without angina pectoris: Secondary | ICD-10-CM

## 2017-09-11 ENCOUNTER — Other Ambulatory Visit: Payer: Self-pay | Admitting: Family

## 2017-09-11 ENCOUNTER — Ambulatory Visit (INDEPENDENT_AMBULATORY_CARE_PROVIDER_SITE_OTHER): Payer: Medicare PPO | Admitting: *Deleted

## 2017-09-11 DIAGNOSIS — Z23 Encounter for immunization: Secondary | ICD-10-CM

## 2017-09-11 DIAGNOSIS — E538 Deficiency of other specified B group vitamins: Secondary | ICD-10-CM | POA: Diagnosis not present

## 2017-09-11 DIAGNOSIS — F32A Depression, unspecified: Secondary | ICD-10-CM

## 2017-09-11 DIAGNOSIS — F329 Major depressive disorder, single episode, unspecified: Secondary | ICD-10-CM

## 2017-09-11 DIAGNOSIS — F411 Generalized anxiety disorder: Secondary | ICD-10-CM

## 2017-09-11 NOTE — Progress Notes (Signed)
Pt given Cyanocobalamin inj Tolerated well 

## 2017-09-29 ENCOUNTER — Other Ambulatory Visit: Payer: Self-pay | Admitting: Family

## 2017-09-29 NOTE — Telephone Encounter (Signed)
Last Vit D 01/11/17  19.0 

## 2017-10-13 ENCOUNTER — Other Ambulatory Visit: Payer: Self-pay | Admitting: Family

## 2017-10-13 ENCOUNTER — Ambulatory Visit (INDEPENDENT_AMBULATORY_CARE_PROVIDER_SITE_OTHER): Payer: Medicare PPO | Admitting: *Deleted

## 2017-10-13 DIAGNOSIS — E538 Deficiency of other specified B group vitamins: Secondary | ICD-10-CM | POA: Diagnosis not present

## 2017-10-13 MED ORDER — VITAMIN D (ERGOCALCIFEROL) 1.25 MG (50000 UNIT) PO CAPS: 50000.0000 [IU] | ORAL_CAPSULE | ORAL | 0 refills | Status: DC

## 2017-10-13 NOTE — Progress Notes (Signed)
Pt given cyanocobalamin inj Tolerated well 

## 2017-10-14 ENCOUNTER — Other Ambulatory Visit: Payer: Self-pay | Admitting: Family Medicine

## 2017-11-13 ENCOUNTER — Ambulatory Visit: Payer: Medicare PPO

## 2017-11-15 ENCOUNTER — Ambulatory Visit (INDEPENDENT_AMBULATORY_CARE_PROVIDER_SITE_OTHER): Payer: Medicare PPO | Admitting: *Deleted

## 2017-11-15 DIAGNOSIS — E538 Deficiency of other specified B group vitamins: Secondary | ICD-10-CM

## 2017-11-15 NOTE — Progress Notes (Signed)
Pt given Cyanocobalamin inj Tolerated well 

## 2017-11-16 ENCOUNTER — Ambulatory Visit: Payer: Medicare PPO

## 2017-11-30 ENCOUNTER — Other Ambulatory Visit: Payer: Self-pay | Admitting: *Deleted

## 2017-11-30 DIAGNOSIS — I2510 Atherosclerotic heart disease of native coronary artery without angina pectoris: Secondary | ICD-10-CM

## 2017-11-30 DIAGNOSIS — I10 Essential (primary) hypertension: Secondary | ICD-10-CM

## 2017-11-30 MED ORDER — LISINOPRIL 10 MG PO TABS: 10.0000 mg | ORAL_TABLET | Freq: Every day | ORAL | 0 refills | Status: DC

## 2017-11-30 MED ORDER — AMLODIPINE BESYLATE 10 MG PO TABS: ORAL_TABLET | ORAL | 0 refills | Status: DC

## 2017-12-08 ENCOUNTER — Other Ambulatory Visit: Payer: Self-pay

## 2017-12-08 DIAGNOSIS — F32A Depression, unspecified: Secondary | ICD-10-CM

## 2017-12-08 DIAGNOSIS — F411 Generalized anxiety disorder: Secondary | ICD-10-CM

## 2017-12-08 DIAGNOSIS — F329 Major depressive disorder, single episode, unspecified: Secondary | ICD-10-CM

## 2017-12-08 MED ORDER — CITALOPRAM HYDROBROMIDE 20 MG PO TABS: ORAL_TABLET | ORAL | 0 refills | Status: DC

## 2017-12-19 ENCOUNTER — Ambulatory Visit (INDEPENDENT_AMBULATORY_CARE_PROVIDER_SITE_OTHER): Payer: Medicare HMO

## 2017-12-19 DIAGNOSIS — E538 Deficiency of other specified B group vitamins: Secondary | ICD-10-CM

## 2017-12-19 NOTE — Progress Notes (Signed)
B12 injection given to left deltoid.  Patient tolerated well. 

## 2017-12-19 NOTE — Patient Instructions (Signed)
Cyanocobalamin, Vitamin B12 injection What is this medicine? CYANOCOBALAMIN (sye an oh koe BAL a min) is a man made form of vitamin B12. Vitamin B12 is used in the growth of healthy blood cells, nerve cells, and proteins in the body. It also helps with the metabolism of fats and carbohydrates. This medicine is used to treat people who can not absorb vitamin B12. This medicine may be used for other purposes; ask your health care provider or pharmacist if you have questions. COMMON BRAND NAME(S): B-12 Compliance Kit, B-12 Injection Kit, Cyomin, LA-12, Nutri-Twelve, Physicians EZ Use B-12, Primabalt What should I tell my health care provider before I take this medicine? They need to know if you have any of these conditions: -kidney disease -Leber's disease -megaloblastic anemia -an unusual or allergic reaction to cyanocobalamin, cobalt, other medicines, foods, dyes, or preservatives -pregnant or trying to get pregnant -breast-feeding How should I use this medicine? This medicine is injected into a muscle or deeply under the skin. It is usually given by a health care professional in a clinic or doctor's office. However, your doctor may teach you how to inject yourself. Follow all instructions. Talk to your pediatrician regarding the use of this medicine in children. Special care may be needed. Overdosage: If you think you have taken too much of this medicine contact a poison control center or emergency room at once. NOTE: This medicine is only for you. Do not share this medicine with others. What if I miss a dose? If you are given your dose at a clinic or doctor's office, call to reschedule your appointment. If you give your own injections and you miss a dose, take it as soon as you can. If it is almost time for your next dose, take only that dose. Do not take double or extra doses. What may interact with this medicine? -colchicine -heavy alcohol intake This list may not describe all possible  interactions. Give your health care provider a list of all the medicines, herbs, non-prescription drugs, or dietary supplements you use. Also tell them if you smoke, drink alcohol, or use illegal drugs. Some items may interact with your medicine. What should I watch for while using this medicine? Visit your doctor or health care professional regularly. You may need blood work done while you are taking this medicine. You may need to follow a special diet. Talk to your doctor. Limit your alcohol intake and avoid smoking to get the best benefit. What side effects may I notice from receiving this medicine? Side effects that you should report to your doctor or health care professional as soon as possible: -allergic reactions like skin rash, itching or hives, swelling of the face, lips, or tongue -blue tint to skin -chest tightness, pain -difficulty breathing, wheezing -dizziness -red, swollen painful area on the leg Side effects that usually do not require medical attention (report to your doctor or health care professional if they continue or are bothersome): -diarrhea -headache This list may not describe all possible side effects. Call your doctor for medical advice about side effects. You may report side effects to FDA at 1-800-FDA-1088. Where should I keep my medicine? Keep out of the reach of children. Store at room temperature between 15 and 30 degrees C (59 and 85 degrees F). Protect from light. Throw away any unused medicine after the expiration date. NOTE: This sheet is a summary. It may not cover all possible information. If you have questions about this medicine, talk to your doctor, pharmacist, or   health care provider.  2018 Elsevier/Gold Standard (2008-03-03 22:10:20)  

## 2018-01-10 ENCOUNTER — Ambulatory Visit (INDEPENDENT_AMBULATORY_CARE_PROVIDER_SITE_OTHER): Payer: Medicare HMO | Admitting: *Deleted

## 2018-01-10 DIAGNOSIS — E538 Deficiency of other specified B group vitamins: Secondary | ICD-10-CM | POA: Diagnosis not present

## 2018-01-10 NOTE — Progress Notes (Signed)
Pt given Cyanocobalamin inj Tolerated well 

## 2018-01-29 ENCOUNTER — Other Ambulatory Visit: Payer: Self-pay | Admitting: *Deleted

## 2018-01-31 ENCOUNTER — Other Ambulatory Visit: Payer: Self-pay | Admitting: Family

## 2018-01-31 NOTE — Telephone Encounter (Signed)
Last seen 07/07/17  [facility]

## 2018-02-08 ENCOUNTER — Ambulatory Visit (INDEPENDENT_AMBULATORY_CARE_PROVIDER_SITE_OTHER): Payer: Medicare HMO | Admitting: *Deleted

## 2018-02-08 DIAGNOSIS — E538 Deficiency of other specified B group vitamins: Secondary | ICD-10-CM

## 2018-02-08 NOTE — Progress Notes (Signed)
Pt given cyanocobalamin inj Tolerated well 

## 2018-02-27 IMAGING — NM NM MISC PROCEDURE
9 series · 54 of 54 positions shown · non-contrast
Comparison: none

[Series 1: wbr_r-proj_st wbr rest · 6.40mm/px · 6 of 64 frames shown]
[frame 6/64]
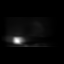
[frame 16/64]
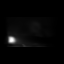
[frame 27/64]
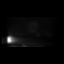
[frame 38/64]
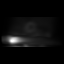
[frame 48/64]
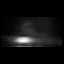
[frame 59/64]
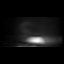

[Series 1: rest sax · 6.4mm · 6.40mm/px · 6 of 23 frames shown]
[frame 2/23]
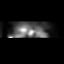
[frame 6/23]
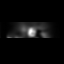
[frame 10/23]
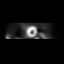
[frame 14/23]
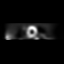
[frame 18/23]
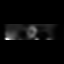
[frame 22/23]
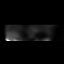

[Series 1: wbr rest · 6.40mm/px · 6 of 64 frames shown]
[frame 6/64]
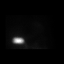
[frame 16/64]
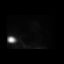
[frame 27/64]
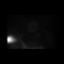
[frame 38/64]
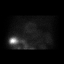
[frame 48/64]
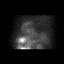
[frame 59/64]
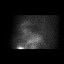

[Series 2: wbr stress-gsp · 6.40mm/px · 6 of 512 frames shown]
[frame 43/512]
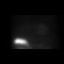
[frame 128/512]
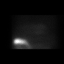
[frame 214/512]
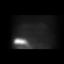
[frame 299/512]
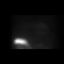
[frame 384/512]
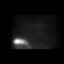
[frame 470/512]
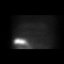

[Series 2: wbr_s-proj_st wbr stress-gsp · 6.40mm/px · 6 of 512 frames shown]
[frame 43/512]
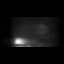
[frame 128/512]
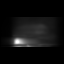
[frame 214/512]
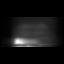
[frame 299/512]
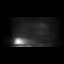
[frame 384/512]
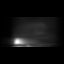
[frame 470/512]
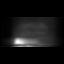

[Series 2: stress sax gs · 6.4mm · 6.40mm/px · 6 of 200 frames shown]
[frame 17/200]
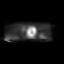
[frame 50/200]
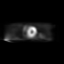
[frame 84/200]
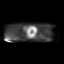
[frame 117/200]
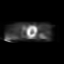
[frame 150/200]
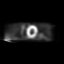
[frame 184/200]
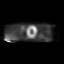

[Series 2: stress sax · 6.4mm · 6.40mm/px · 6 of 25 frames shown]
[frame 3/25]
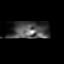
[frame 7/25]
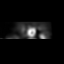
[frame 11/25]
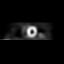
[frame 15/25]
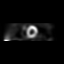
[frame 19/25]
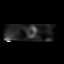
[frame 23/25]
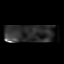

[Series 3: wbr stress-sum-em · 6.40mm/px · 6 of 64 frames shown]
[frame 6/64]
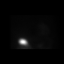
[frame 16/64]
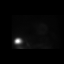
[frame 27/64]
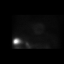
[frame 38/64]
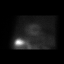
[frame 48/64]
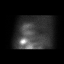
[frame 59/64]
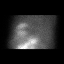

[Series 3: wbr_s-proj_st wbr stress-sum-em · 6.40mm/px · 6 of 64 frames shown]
[frame 6/64]
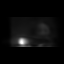
[frame 16/64]
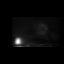
[frame 27/64]
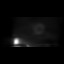
[frame 38/64]
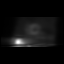
[frame 48/64]
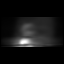
[frame 59/64]
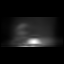

[54 of 54 positions shown; findings below may reference images not displayed]

Canned report from images found in remote index.

Refer to host system for actual result text.

## 2018-02-28 ENCOUNTER — Other Ambulatory Visit: Payer: Self-pay | Admitting: Family

## 2018-02-28 DIAGNOSIS — I10 Essential (primary) hypertension: Secondary | ICD-10-CM

## 2018-02-28 DIAGNOSIS — I2510 Atherosclerotic heart disease of native coronary artery without angina pectoris: Secondary | ICD-10-CM

## 2018-03-01 NOTE — Telephone Encounter (Signed)
Last seen 07/07/17  Casey Klein PCP

## 2018-03-02 ENCOUNTER — Other Ambulatory Visit: Payer: Self-pay | Admitting: Family

## 2018-03-02 NOTE — Telephone Encounter (Signed)
Last seen 07/07/17.

## 2018-03-06 ENCOUNTER — Other Ambulatory Visit: Payer: Self-pay | Admitting: Family

## 2018-03-06 DIAGNOSIS — F329 Major depressive disorder, single episode, unspecified: Secondary | ICD-10-CM

## 2018-03-06 DIAGNOSIS — F32A Depression, unspecified: Secondary | ICD-10-CM

## 2018-03-06 DIAGNOSIS — F411 Generalized anxiety disorder: Secondary | ICD-10-CM

## 2018-03-13 ENCOUNTER — Ambulatory Visit (INDEPENDENT_AMBULATORY_CARE_PROVIDER_SITE_OTHER): Payer: Medicare HMO | Admitting: *Deleted

## 2018-03-13 DIAGNOSIS — E538 Deficiency of other specified B group vitamins: Secondary | ICD-10-CM | POA: Diagnosis not present

## 2018-03-13 NOTE — Progress Notes (Signed)
Pt given Cyanocobalamin inj Tolerated well 

## 2018-04-03 ENCOUNTER — Other Ambulatory Visit: Payer: Self-pay | Admitting: Family

## 2018-04-03 NOTE — Telephone Encounter (Signed)
Last seen 07/07/17  [facility]

## 2018-04-12 ENCOUNTER — Ambulatory Visit (INDEPENDENT_AMBULATORY_CARE_PROVIDER_SITE_OTHER): Payer: Medicare HMO | Admitting: *Deleted

## 2018-04-12 DIAGNOSIS — E538 Deficiency of other specified B group vitamins: Secondary | ICD-10-CM

## 2018-04-12 NOTE — Progress Notes (Signed)
Pt given Cyanocobalamin inj Tolerated well 

## 2018-04-13 ENCOUNTER — Other Ambulatory Visit: Payer: Self-pay | Admitting: Family

## 2018-05-14 ENCOUNTER — Ambulatory Visit (INDEPENDENT_AMBULATORY_CARE_PROVIDER_SITE_OTHER): Payer: Medicare HMO | Admitting: *Deleted

## 2018-05-14 ENCOUNTER — Telehealth: Payer: Self-pay | Admitting: Family

## 2018-05-14 DIAGNOSIS — E538 Deficiency of other specified B group vitamins: Secondary | ICD-10-CM | POA: Diagnosis not present

## 2018-05-14 NOTE — Progress Notes (Signed)
Pt given Cyanocobalamin inj Tolerated well 

## 2018-05-14 NOTE — Telephone Encounter (Signed)
Patient NTBS for follow up and lab work  

## 2018-05-14 NOTE — Telephone Encounter (Signed)
Apt made 6/11.

## 2018-05-15 ENCOUNTER — Encounter: Payer: Self-pay | Admitting: Family

## 2018-05-15 ENCOUNTER — Ambulatory Visit (INDEPENDENT_AMBULATORY_CARE_PROVIDER_SITE_OTHER): Payer: Medicare HMO | Admitting: Family

## 2018-05-15 VITALS — BP 128/77 | HR 74 | Temp 97.0°F | Ht 69.0 in | Wt 197.6 lb

## 2018-05-15 DIAGNOSIS — I2510 Atherosclerotic heart disease of native coronary artery without angina pectoris: Secondary | ICD-10-CM | POA: Diagnosis not present

## 2018-05-15 DIAGNOSIS — F411 Generalized anxiety disorder: Secondary | ICD-10-CM | POA: Diagnosis not present

## 2018-05-15 DIAGNOSIS — E785 Hyperlipidemia, unspecified: Secondary | ICD-10-CM

## 2018-05-15 DIAGNOSIS — M1990 Unspecified osteoarthritis, unspecified site: Secondary | ICD-10-CM | POA: Diagnosis not present

## 2018-05-15 DIAGNOSIS — E8881 Metabolic syndrome: Secondary | ICD-10-CM

## 2018-05-15 DIAGNOSIS — I10 Essential (primary) hypertension: Secondary | ICD-10-CM

## 2018-05-15 DIAGNOSIS — R69 Illness, unspecified: Secondary | ICD-10-CM | POA: Diagnosis not present

## 2018-05-15 DIAGNOSIS — J4520 Mild intermittent asthma, uncomplicated: Secondary | ICD-10-CM

## 2018-05-15 DIAGNOSIS — E559 Vitamin D deficiency, unspecified: Secondary | ICD-10-CM | POA: Diagnosis not present

## 2018-05-15 DIAGNOSIS — F331 Major depressive disorder, recurrent, moderate: Secondary | ICD-10-CM

## 2018-05-15 DIAGNOSIS — E538 Deficiency of other specified B group vitamins: Secondary | ICD-10-CM | POA: Diagnosis not present

## 2018-05-15 DIAGNOSIS — E663 Overweight: Secondary | ICD-10-CM | POA: Insufficient documentation

## 2018-05-15 MED ORDER — ATORVASTATIN CALCIUM 10 MG PO TABS: 10.0000 mg | ORAL_TABLET | Freq: Every day | ORAL | 1 refills | Status: DC

## 2018-05-15 MED ORDER — CITALOPRAM HYDROBROMIDE 40 MG PO TABS: 40.0000 mg | ORAL_TABLET | Freq: Every day | ORAL | 5 refills | Status: DC

## 2018-05-15 MED ORDER — MELOXICAM 15 MG PO TABS: ORAL_TABLET | ORAL | 1 refills | Status: DC

## 2018-05-15 MED ORDER — BUDESONIDE-FORMOTEROL FUMARATE 80-4.5 MCG/ACT IN AERO: INHALATION_SPRAY | RESPIRATORY_TRACT | 5 refills | Status: DC

## 2018-05-15 MED ORDER — LISINOPRIL 10 MG PO TABS: 10.0000 mg | ORAL_TABLET | Freq: Every day | ORAL | 2 refills | Status: DC

## 2018-05-15 MED ORDER — AMLODIPINE BESYLATE 10 MG PO TABS: ORAL_TABLET | ORAL | 0 refills | Status: DC

## 2018-05-15 NOTE — Progress Notes (Signed)
Subjective:    Patient ID: Casey Klein, male    DOB: 09/11/1941, 77 y.o.   MRN: [id number]  Chief Complaint  Patient presents with  . Hypertension    medication refills    Pt presents to the office today for chronic follow up. Pt is followed by Cardiologists once a year, but states he is over due.  Hypertension  This is a chronic problem. The current episode started more than 1 year ago. The problem has been resolved since onset. The problem is controlled. Associated symptoms include anxiety and peripheral edema ("in the evening"). Pertinent negatives include no shortness of breath. Risk factors for coronary artery disease include dyslipidemia and male gender. The current treatment provides moderate improvement. Hypertensive end-organ damage includes CAD/MI. There is no history of heart failure.  Hyperlipidemia  This is a chronic problem. The current episode started more than 1 year ago. The problem is controlled. Recent lipid tests were reviewed and are normal. Pertinent negatives include no shortness of breath. Current antihyperlipidemic treatment includes diet change. The current treatment provides mild improvement of lipids. Risk factors for coronary artery disease include dyslipidemia, hypertension and a sedentary lifestyle.  Depression         This is a chronic problem.  The current episode started more than 1 year ago.   The onset quality is gradual.   The problem occurs intermittently.  The problem has been waxing and waning since onset.  Associated symptoms include restlessness and sad.  Associated symptoms include no helplessness, no hopelessness and not irritable.  Past treatments include SSRIs - Selective serotonin reuptake inhibitors.  Past medical history includes anxiety.     Pertinent negatives include no head trauma. Anxiety  Presents for follow-up visit. Symptoms include excessive worry, nervous/anxious behavior and restlessness. Patient reports no irritability or  shortness of breath. Symptoms occur most days. The severity of symptoms is moderate.   His past medical history is significant for asthma.  Asthma  He complains of cough, hoarse voice and wheezing. There is no difficulty breathing, hemoptysis or shortness of breath. This is a chronic problem. The current episode started more than 1 year ago. The problem occurs intermittently. The problem has been waxing and waning. His past medical history is significant for asthma.  Arthritis  Presents for follow-up visit. He complains of pain and stiffness. Affected locations include the right knee and left knee.  Metabolic Syndrome Pt states he is active in his garden.   Review of Systems  Constitutional: Negative for irritability.  HENT: Positive for hoarse voice.   Respiratory: Positive for cough and wheezing. Negative for hemoptysis and shortness of breath.   Musculoskeletal: Positive for arthritis and stiffness.  Psychiatric/Behavioral: Positive for depression. The patient is nervous/anxious.   All other systems reviewed and are negative.      Objective:   Physical Exam  Constitutional: He is oriented to person, place, and time. He appears well-developed and well-nourished. He is not irritable. No distress.  HENT:  Head: Normocephalic.  Right Ear: External ear normal.  Left Ear: External ear normal.  Mouth/Throat: Oropharynx is clear and moist.  Eyes: Pupils are equal, round, and reactive to light. Right eye exhibits no discharge. Left eye exhibits no discharge.  Neck: Normal range of motion. Neck supple. No thyromegaly present.  Cardiovascular: Normal rate, regular rhythm, normal heart sounds and intact distal pulses.  No murmur heard. Pulmonary/Chest: Effort normal and breath sounds normal. No respiratory distress. He has no wheezes.  Abdominal: Soft.  Bowel sounds are normal. He exhibits no distension. There is no tenderness.  Musculoskeletal: Normal range of motion. He exhibits no edema or  tenderness.  Neurological: He is alert and oriented to person, place, and time. He has normal reflexes. No cranial nerve deficit.  Skin: Skin is warm and dry. No rash noted. No erythema.  Psychiatric: He has a normal mood and affect. His behavior is normal. Judgment and thought content normal.  Vitals reviewed.     BP 128/77   Pulse 74   Temp (!) 97 F (36.1 C) (Oral)   Ht 5' 9"  (1.753 m)   Wt 197 lb 9.6 oz (89.6 kg)   BMI 29.18 kg/m      Assessment & Plan:  Casey Klein comes in today with chief complaint of Hypertension (medication refills)   Diagnosis and orders addressed:  1. Essential hypertension, benign - CMP14+EGFR - CBC with Differential/Platelet - lisinopril (PRINIVIL,ZESTRIL) 10 MG tablet; Take 1 tablet (10 mg total) by mouth daily.  Dispense: 90 tablet; Refill: 2 - amLODipine (NORVASC) 10 MG tablet; TAKE 1 TABLET BY MOUTH EVERY DAY  Dispense: 90 tablet; Refill: 0  2. Coronary artery disease involving native coronary artery of native heart without angina pectoris - CMP14+EGFR - Lipid panel - CBC with Differential/Platelet - atorvastatin (LIPITOR) 10 MG tablet; Take 1 tablet (10 mg total) by mouth daily.  Dispense: 90 tablet; Refill: 1 - lisinopril (PRINIVIL,ZESTRIL) 10 MG tablet; Take 1 tablet (10 mg total) by mouth daily.  Dispense: 90 tablet; Refill: 2  3. Mild intermittent asthma without complication - NZV72+QASU - CBC with Differential/Platelet - budesonide-formoterol (SYMBICORT) 80-4.5 MCG/ACT inhaler; INHALE 2 PUFFS INTO THE LUNGS 2 (TWO) TIMES DAILY.  Dispense: 10.2 Inhaler; Refill: 5  4. Arthritis - CMP14+EGFR - CBC with Differential/Platelet - meloxicam (MOBIC) 15 MG tablet; TAKE 1 TABLET BY MOUTH EVERY DAY NEEDS TO BE SEEN  Dispense: 90 tablet; Refill: 1  5. Moderate episode of recurrent major depressive disorder (HCC) Will start Lipitor daily - CMP14+EGFR - CBC with Differential/Platelet - citalopram (CELEXA) 40 MG tablet; Take 1 tablet  (40 mg total) by mouth daily.  Dispense: 30 tablet; Refill: 5  6. GAD (generalized anxiety disorder) We will increase Celexa to 40 mg from 20 mg today Stress management  - CMP14+EGFR - CBC with Differential/Platelet - citalopram (CELEXA) 40 MG tablet; Take 1 tablet (40 mg total) by mouth daily.  Dispense: 30 tablet; Refill: 5  7. Vitamin D deficiency - CMP14+EGFR - CBC with Differential/Platelet - VITAMIN D 25 Hydroxy (Vit-D Deficiency, Fractures)  8. Hyperlipidemia, unspecified hyperlipidemia type - CMP14+EGFR - Lipid panel - CBC with Differential/Platelet - atorvastatin (LIPITOR) 10 MG tablet; Take 1 tablet (10 mg total) by mouth daily.  Dispense: 90 tablet; Refill: 1  9. Metabolic syndrome -Will start Lipitor today - CMP14+EGFR - CBC with Differential/Platelet - atorvastatin (LIPITOR) 10 MG tablet; Take 1 tablet (10 mg total) by mouth daily.  Dispense: 90 tablet; Refill: 1  10. Vitamin B 12 deficiency - CMP14+EGFR - CBC with Differential/Platelet - Vitamin B12  11. Overweight (BMI 25.0-29.9)   Labs pending Health Maintenance reviewed Diet and exercise encouraged  Follow up plan: 6 weeks    Evelina Dun, FNP

## 2018-05-15 NOTE — Patient Instructions (Signed)
Fat and Cholesterol Restricted Diet Getting too much fat and cholesterol in your diet may cause health problems. Following this diet helps keep your fat and cholesterol at normal levels. This can keep you from getting sick. What types of fat should I choose?  Choose monosaturated and polyunsaturated fats. These are found in foods such as olive oil, canola oil, flaxseeds, walnuts, almonds, and seeds.  Eat more omega-3 fats. Good choices include salmon, mackerel, sardines, tuna, flaxseed oil, and ground flaxseeds.  Limit saturated fats. These are in animal products such as meats, butter, and cream. They can also be in plant products such as palm oil, palm kernel oil, and coconut oil.  Avoid foods with partially hydrogenated oils in them. These contain trans fats. Examples of foods that have trans fats are stick margarine, some tub margarines, cookies, crackers, and other baked goods. What general guidelines do I need to follow?  Check food labels. Look for the words "trans fat" and "saturated fat."  When preparing a meal: ? Fill half of your plate with vegetables and green salads. ? Fill one fourth of your plate with whole grains. Look for the word "whole" as the first word in the ingredient list. ? Fill one fourth of your plate with lean protein foods.  Eat more foods that have fiber, like apples, carrots, beans, peas, and barley.  Eat more home-cooked foods. Eat less at restaurants and buffets.  Limit or avoid alcohol.  Limit foods high in starch and sugar.  Limit fried foods.  Cook foods without frying them. Baking, boiling, grilling, and broiling are all great options.  Lose weight if you are overweight. Losing even a small amount of weight can help your overall health. It can also help prevent diseases such as diabetes and heart disease. What foods can I eat? Grains Whole grains, such as whole wheat or whole grain breads, crackers, cereals, and pasta. Unsweetened oatmeal,  bulgur, barley, quinoa, or brown rice. Corn or whole wheat flour tortillas. Vegetables Fresh or frozen vegetables (raw, steamed, roasted, or grilled). Green salads. Fruits All fresh, canned (in natural juice), or frozen fruits. Meat and Other Protein Products Ground beef (85% or leaner), grass-fed beef, or beef trimmed of fat. Skinless chicken or turkey. Ground chicken or turkey. Pork trimmed of fat. All fish and seafood. Eggs. Dried beans, peas, or lentils. Unsalted nuts or seeds. Unsalted canned or dry beans. Dairy Low-fat dairy products, such as skim or 1% milk, 2% or reduced-fat cheeses, low-fat ricotta or cottage cheese, or plain low-fat yogurt. Fats and Oils Tub margarines without trans fats. Light or reduced-fat mayonnaise and salad dressings. Avocado. Olive, canola, sesame, or safflower oils. Natural peanut or almond butter (choose ones without added sugar and oil). The items listed above may not be a complete list of recommended foods or beverages. Contact your dietitian for more options. What foods are not recommended? Grains White bread. White pasta. White rice. Cornbread. Bagels, pastries, and croissants. Crackers that contain trans fat. Vegetables White potatoes. Corn. Creamed or fried vegetables. Vegetables in a cheese sauce. Fruits Dried fruits. Canned fruit in light or heavy syrup. Fruit juice. Meat and Other Protein Products Fatty cuts of meat. Ribs, chicken wings, bacon, sausage, bologna, salami, chitterlings, fatback, hot dogs, bratwurst, and packaged luncheon meats. Liver and organ meats. Dairy Whole or 2% milk, cream, half-and-half, and cream cheese. Whole milk cheeses. Whole-fat or sweetened yogurt. Full-fat cheeses. Nondairy creamers and whipped toppings. Processed cheese, cheese spreads, or cheese curds. Sweets and Desserts Corn   syrup, sugars, honey, and molasses. Candy. Jam and jelly. Syrup. Sweetened cereals. Cookies, pies, cakes, donuts, muffins, and ice  cream. Fats and Oils Butter, stick margarine, lard, shortening, ghee, or bacon fat. Coconut, palm kernel, or palm oils. Beverages Alcohol. Sweetened drinks (such as sodas, lemonade, and fruit drinks or punches). The items listed above may not be a complete list of foods and beverages to avoid. Contact your dietitian for more information. This information is not intended to replace advice given to you by your health care provider. Make sure you discuss any questions you have with your health care provider. Document Released: 05/22/2012 Document Revised: 07/28/2016 Document Reviewed: 02/20/2014 Elsevier Interactive Patient Education  2018 [employer]  

## 2018-05-16 LAB — CMP14+EGFR
A/G RATIO: 2.1 (ref 1.2–2.2)
ALT: 18 IU/L (ref 0–44)
AST: 17 IU/L (ref 0–40)
Albumin: 4.7 g/dL (ref 3.5–4.8)
Alkaline Phosphatase: 42 IU/L (ref 39–117)
BUN/Creatinine Ratio: 12 (ref 10–24)
BUN: 14 mg/dL (ref 8–27)
Bilirubin Total: 1 mg/dL (ref 0.0–1.2)
CALCIUM: 8.9 mg/dL (ref 8.6–10.2)
CO2: 21 mmol/L (ref 20–29)
Chloride: 104 mmol/L (ref 96–106)
Creatinine, Ser: 1.15 mg/dL (ref 0.76–1.27)
GFR, EST AFRICAN AMERICAN: 70 mL/min/{1.73_m2} (ref >59)
GFR, EST NON AFRICAN AMERICAN: 61 mL/min/{1.73_m2} (ref >59)
GLOBULIN, TOTAL: 2.2 g/dL (ref 1.5–4.5)
Glucose: 82 mg/dL (ref 65–99)
POTASSIUM: 3.9 mmol/L (ref 3.5–5.2)
SODIUM: 141 mmol/L (ref 134–144)
TOTAL PROTEIN: 6.9 g/dL (ref 6.0–8.5)

## 2018-05-16 LAB — VITAMIN B12

## 2018-05-16 LAB — CBC WITH DIFFERENTIAL/PLATELET
Basophils Absolute: 0.1 x10E3/uL (ref 0.0–0.2)
Basos: 1 %
EOS (ABSOLUTE): 0.2 x10E3/uL (ref 0.0–0.4)
Eos: 2 %
Hematocrit: 43.2 % (ref 37.5–51.0)
Hemoglobin: 14.5 g/dL (ref 13.0–17.7)
Immature Grans (Abs): 0 x10E3/uL (ref 0.0–0.1)
Immature Granulocytes: 0 %
Lymphocytes Absolute: 2.2 x10E3/uL (ref 0.7–3.1)
Lymphs: 26 %
MCH: 31.1 pg (ref 26.6–33.0)
MCHC: 33.6 g/dL (ref 31.5–35.7)
MCV: 93 fL (ref 79–97)
Monocytes Absolute: 0.7 x10E3/uL (ref 0.1–0.9)
Monocytes: 8 %
Neutrophils Absolute: 5.5 x10E3/uL (ref 1.4–7.0)
Neutrophils: 63 %
Platelets: 249 x10E3/uL (ref 150–450)
RBC: 4.66 x10E6/uL (ref 4.14–5.80)
RDW: 17.7 % — ABNORMAL HIGH (ref 12.3–15.4)
WBC: 8.7 x10E3/uL (ref 3.4–10.8)

## 2018-05-16 LAB — LIPID PANEL
CHOL/HDL RATIO: 4.4 ratio (ref 0.0–5.0)
Cholesterol, Total: 172 mg/dL (ref 100–199)
HDL: 39 mg/dL — ABNORMAL LOW (ref >39)
LDL Calculated: 101 mg/dL — ABNORMAL HIGH (ref 0–99)
Triglycerides: 159 mg/dL — ABNORMAL HIGH (ref 0–149)
VLDL Cholesterol Cal: 32 mg/dL (ref 5–40)

## 2018-05-16 LAB — VITAMIN D 25 HYDROXY (VIT D DEFICIENCY, FRACTURES): Vit D, 25-Hydroxy: 31.7 ng/mL (ref 30.0–100.0)

## 2018-05-17 ENCOUNTER — Other Ambulatory Visit: Payer: Self-pay | Admitting: *Deleted

## 2018-06-02 ENCOUNTER — Other Ambulatory Visit: Payer: Self-pay | Admitting: Family

## 2018-06-02 DIAGNOSIS — F411 Generalized anxiety disorder: Secondary | ICD-10-CM

## 2018-06-02 DIAGNOSIS — F329 Major depressive disorder, single episode, unspecified: Secondary | ICD-10-CM

## 2018-06-02 DIAGNOSIS — F32A Depression, unspecified: Secondary | ICD-10-CM

## 2018-06-09 ENCOUNTER — Other Ambulatory Visit: Payer: Self-pay | Admitting: Family

## 2018-06-09 DIAGNOSIS — F411 Generalized anxiety disorder: Secondary | ICD-10-CM

## 2018-06-09 DIAGNOSIS — F331 Major depressive disorder, recurrent, moderate: Secondary | ICD-10-CM

## 2018-06-13 ENCOUNTER — Ambulatory Visit (INDEPENDENT_AMBULATORY_CARE_PROVIDER_SITE_OTHER): Payer: Medicare HMO | Admitting: *Deleted

## 2018-06-13 DIAGNOSIS — E538 Deficiency of other specified B group vitamins: Secondary | ICD-10-CM | POA: Diagnosis not present

## 2018-06-13 NOTE — Progress Notes (Signed)
Pt given Cyanocobalamin inj Tolerated well 

## 2018-06-26 ENCOUNTER — Encounter: Payer: Self-pay | Admitting: Family

## 2018-06-26 ENCOUNTER — Ambulatory Visit (INDEPENDENT_AMBULATORY_CARE_PROVIDER_SITE_OTHER): Payer: Medicare HMO | Admitting: Family

## 2018-06-26 VITALS — BP 145/82 | HR 74 | Temp 96.8°F | Ht 69.0 in | Wt 197.6 lb

## 2018-06-26 DIAGNOSIS — M1990 Unspecified osteoarthritis, unspecified site: Secondary | ICD-10-CM | POA: Diagnosis not present

## 2018-06-26 DIAGNOSIS — H6121 Impacted cerumen, right ear: Secondary | ICD-10-CM

## 2018-06-26 DIAGNOSIS — E785 Hyperlipidemia, unspecified: Secondary | ICD-10-CM | POA: Diagnosis not present

## 2018-06-26 DIAGNOSIS — E663 Overweight: Secondary | ICD-10-CM

## 2018-06-26 DIAGNOSIS — J4520 Mild intermittent asthma, uncomplicated: Secondary | ICD-10-CM | POA: Diagnosis not present

## 2018-06-26 DIAGNOSIS — E538 Deficiency of other specified B group vitamins: Secondary | ICD-10-CM

## 2018-06-26 DIAGNOSIS — Z0000 Encounter for general adult medical examination without abnormal findings: Secondary | ICD-10-CM | POA: Diagnosis not present

## 2018-06-26 DIAGNOSIS — I10 Essential (primary) hypertension: Secondary | ICD-10-CM

## 2018-06-26 DIAGNOSIS — E559 Vitamin D deficiency, unspecified: Secondary | ICD-10-CM

## 2018-06-26 DIAGNOSIS — R69 Illness, unspecified: Secondary | ICD-10-CM | POA: Diagnosis not present

## 2018-06-26 DIAGNOSIS — Z9181 History of falling: Secondary | ICD-10-CM

## 2018-06-26 DIAGNOSIS — I2510 Atherosclerotic heart disease of native coronary artery without angina pectoris: Secondary | ICD-10-CM | POA: Diagnosis not present

## 2018-06-26 DIAGNOSIS — F411 Generalized anxiety disorder: Secondary | ICD-10-CM

## 2018-06-26 DIAGNOSIS — F331 Major depressive disorder, recurrent, moderate: Secondary | ICD-10-CM

## 2018-06-26 NOTE — Addendum Note (Signed)
Addended by: Almeta MonasSTONE, JANIE M on: 06/26/2018 03:50 PM   Modules accepted: Orders

## 2018-06-26 NOTE — Progress Notes (Signed)
Subjective:    Patient ID: Casey Klein, male    DOB: 30-Oct-1941, 77 y.o.   MRN: [id number]  Chief Complaint  Patient presents with  . Annual Exam   Pt presents to the office today for CPE. Pt is followed by Cardiologists once a year, but states he is over due. Arthritis  Presents for follow-up visit. He complains of pain and stiffness. The symptoms have been stable. Affected locations include the left knee and left hip. His pain is at a severity of 8/10.  Hypertension  This is a chronic problem. The current episode started more than 1 year ago. The problem has been waxing and waning since onset. The problem is uncontrolled. Associated symptoms include anxiety and malaise/fatigue. Pertinent negatives include no peripheral edema or shortness of breath. Risk factors for coronary artery disease include dyslipidemia, male gender, obesity and sedentary lifestyle. The current treatment provides mild improvement. Hypertensive end-organ damage includes CAD/MI.  Asthma  There is no difficulty breathing or shortness of breath. This is a chronic problem. The current episode started more than 1 year ago. The problem occurs intermittently. Associated symptoms include malaise/fatigue. Pertinent negatives include no ear congestion, ear pain, rhinorrhea or sore throat. His past medical history is significant for asthma.  Hyperlipidemia  This is a chronic problem. The current episode started more than 1 year ago. The problem is uncontrolled. Recent lipid tests were reviewed and are high. Pertinent negatives include no shortness of breath. Current antihyperlipidemic treatment includes statins. The current treatment provides moderate improvement of lipids. Risk factors for coronary artery disease include dyslipidemia, male sex and hypertension.  Anxiety  Presents for follow-up visit. Symptoms include excessive worry, nervous/anxious behavior and restlessness. Patient reports no irritability or shortness of  breath. Symptoms occur most days.   His past medical history is significant for asthma.  Depression         This is a chronic problem.  The current episode started more than 1 year ago.   The onset quality is gradual.   The problem occurs intermittently.  The problem has been waxing and waning since onset.  Associated symptoms include irritable and restlessness.  Associated symptoms include no helplessness, no hopelessness and not sad.  Past treatments include SSRIs - Selective serotonin reuptake inhibitors.  Past medical history includes anxiety.       Review of Systems  Constitutional: Positive for malaise/fatigue. Negative for irritability.  HENT: Negative for ear pain, rhinorrhea and sore throat.   Respiratory: Negative for shortness of breath.   Musculoskeletal: Positive for arthritis and stiffness.  Psychiatric/Behavioral: Positive for depression. The patient is nervous/anxious.   All other systems reviewed and are negative.      Objective:   Physical Exam  Constitutional: He is oriented to person, place, and time. He appears well-developed and well-nourished. He is irritable. No distress.  HENT:  Head: Normocephalic.  Left Ear: External ear normal.  Mouth/Throat: Oropharynx is clear and moist.  Right ear cerumen impaction   Eyes: Pupils are equal, round, and reactive to light. Right eye exhibits no discharge. Left eye exhibits no discharge.  Neck: Normal range of motion. Neck supple. No thyromegaly present.  Cardiovascular: Normal rate, regular rhythm, normal heart sounds and intact distal pulses.  No murmur heard. Pulmonary/Chest: Effort normal and breath sounds normal. No respiratory distress. He has no wheezes.  Abdominal: Soft. Bowel sounds are normal. He exhibits no distension. There is no tenderness.  Musculoskeletal: Normal range of motion. He exhibits no edema or  tenderness.  Neurological: He is alert and oriented to person, place, and time. He has normal reflexes. No  cranial nerve deficit.  Skin: Skin is warm and dry. No rash noted. No erythema.  Psychiatric: He has a normal mood and affect. His behavior is normal. Judgment and thought content normal.  Vitals reviewed.   Right ear cleaned with water and peroxide- Section Paper towel removed. TM WNL  BP (!) 145/82   Pulse 74   Temp (!) 96.8 F (36 C) (Oral)   Ht 5' 9"  (1.753 m)   Wt 197 lb 9.6 oz (89.6 kg)   BMI 29.18 kg/m      Assessment & Plan:  HAMLET LASECKI comes in today with chief complaint of Annual Exam   Diagnosis and orders addressed:  1. Arthritis - CMP14+EGFR - CBC with Differential/Platelet  2. Mild intermittent asthma without complication - [id number]+GWLT - CBC with Differential/Platelet  3. Coronary artery disease involving native coronary artery of native heart without angina pectoris - CMP14+EGFR - CBC with Differential/Platelet  4. Moderate episode of recurrent major depressive disorder (HCC) - CMP14+EGFR - CBC with Differential/Platelet  5. Essential hypertension, benign - CMP14+EGFR - CBC with Differential/Platelet  6. GAD (generalized anxiety disorder) - CMP14+EGFR - CBC with Differential/Platelet  7. Hyperlipidemia, unspecified hyperlipidemia type - CMP14+EGFR - CBC with Differential/Platelet  8. Overweight (BMI 25.0-29.9) - CMP14+EGFR - CBC with Differential/Platelet  9. Risk for falls - CMP14+EGFR - CBC with Differential/Platelet  10. Vitamin B 12 deficiency - CMP14+EGFR - CBC with Differential/Platelet  11. Vitamin D deficiency - CMP14+EGFR - CBC with Differential/Platelet  12. Impacted cerumen of right ear Do not stick anything into ear   Labs pending Health Maintenance reviewed Diet and exercise encouraged  Follow up plan: 6 months    Evelina Dun, FNP

## 2018-06-26 NOTE — Patient Instructions (Signed)
Earwax Buildup, Adult The ears produce a substance called earwax that helps keep bacteria out of the ear and protects the skin in the ear canal. Occasionally, earwax can build up in the ear and cause discomfort or hearing loss. What increases the risk? This condition is more likely to develop in people who:  Are male.  Are elderly.  Naturally produce more earwax.  Clean their ears often with cotton swabs.  Use earplugs often.  Use in-ear headphones often.  Wear hearing aids.  Have narrow ear canals.  Have earwax that is overly thick or sticky.  Have eczema.  Are dehydrated.  Have excess hair in the ear canal.  What are the signs or symptoms? Symptoms of this condition include:  Reduced or muffled hearing.  A feeling of fullness in the ear or feeling that the ear is plugged.  Fluid coming from the ear.  Ear pain.  Ear itch.  Ringing in the ear.  Coughing.  An obvious piece of earwax that can be seen inside the ear canal.  How is this diagnosed? This condition may be diagnosed based on:  Your symptoms.  Your medical history.  An ear exam. During the exam, your health care provider will look into your ear with an instrument called an otoscope.  You may have tests, including a hearing test. How is this treated? This condition may be treated by:  Using ear drops to soften the earwax.  Having the earwax removed by a health care provider. The health care provider may: ? Flush the ear with water. ? Use an instrument that has a loop on the end (curette). ? Use a suction device.  Surgery to remove the wax buildup. This may be done in severe cases.  Follow these instructions at home:  Take over-the-counter and prescription medicines only as told by your health care provider.  Do not put any objects, including cotton swabs, into your ear. You can clean the opening of your ear canal with a washcloth or facial tissue.  Follow instructions from your health  care provider about cleaning your ears. Do not over-clean your ears.  Drink enough fluid to keep your urine clear or pale yellow. This will help to thin the earwax.  Keep all follow-up visits as told by your health care provider. If earwax builds up in your ears often or if you use hearing aids, consider seeing your health care provider for routine, preventive ear cleanings. Ask your health care provider how often you should schedule your cleanings.  If you have hearing aids, clean them according to instructions from the manufacturer and your health care provider. Contact a health care provider if:  You have ear pain.  You develop a fever.  You have blood, pus, or other fluid coming from your ear.  You have hearing loss.  You have ringing in your ears that does not go away.  Your symptoms do not improve with treatment.  You feel like the room is spinning (vertigo). Summary  Earwax can build up in the ear and cause discomfort or hearing loss.  The most common symptoms of this condition include reduced or muffled hearing and a feeling of fullness in the ear or feeling that the ear is plugged.  This condition may be diagnosed based on your symptoms, your medical history, and an ear exam.  This condition may be treated by using ear drops to soften the earwax or by having the earwax removed by a health care provider.  Do   not put any objects, including cotton swabs, into your ear. You can clean the opening of your ear canal with a washcloth or facial tissue. This information is not intended to replace advice given to you by your health care provider. Make sure you discuss any questions you have with your health care provider. Document Released: 12/29/2004 Document Revised: 02/01/2017 Document Reviewed: 02/01/2017 Elsevier Interactive Patient Education  2018 [employer]  

## 2018-06-27 LAB — CBC WITH DIFFERENTIAL/PLATELET
BASOS ABS: 0.1 10*3/uL (ref 0.0–0.2)
BASOS: 1 %
EOS (ABSOLUTE): 0.1 10*3/uL (ref 0.0–0.4)
Eos: 1 %
Hematocrit: 40.5 % (ref 37.5–51.0)
Hemoglobin: 13.7 g/dL (ref 13.0–17.7)
Immature Grans (Abs): 0.1 10*3/uL (ref 0.0–0.1)
Immature Granulocytes: 1 %
LYMPHS ABS: 2.3 10*3/uL (ref 0.7–3.1)
LYMPHS: 23 %
MCH: 31.5 pg (ref 26.6–33.0)
MCHC: 33.8 g/dL (ref 31.5–35.7)
MCV: 93 fL (ref 79–97)
MONOS ABS: 0.8 10*3/uL (ref 0.1–0.9)
Monocytes: 8 %
NEUTROS ABS: 6.9 10*3/uL (ref 1.4–7.0)
Neutrophils: 66 %
PLATELETS: 280 10*3/uL (ref 150–450)
RBC: 4.35 x10E6/uL (ref 4.14–5.80)
RDW: 16.3 % — AB (ref 12.3–15.4)
WBC: 10.3 10*3/uL (ref 3.4–10.8)

## 2018-06-27 LAB — CMP14+EGFR
A/G RATIO: 1.8 (ref 1.2–2.2)
ALBUMIN: 4.5 g/dL (ref 3.5–4.8)
ALT: 16 IU/L (ref 0–44)
AST: 17 IU/L (ref 0–40)
Alkaline Phosphatase: 55 IU/L (ref 39–117)
BUN / CREAT RATIO: 16 (ref 10–24)
BUN: 14 mg/dL (ref 8–27)
Bilirubin Total: 0.6 mg/dL (ref 0.0–1.2)
CALCIUM: 8.9 mg/dL (ref 8.6–10.2)
CHLORIDE: 106 mmol/L (ref 96–106)
CO2: 21 mmol/L (ref 20–29)
Creatinine, Ser: 0.9 mg/dL (ref 0.76–1.27)
GFR, EST AFRICAN AMERICAN: 94 mL/min/{1.73_m2} (ref >59)
GFR, EST NON AFRICAN AMERICAN: 82 mL/min/{1.73_m2} (ref >59)
Globulin, Total: 2.5 g/dL (ref 1.5–4.5)
Glucose: 89 mg/dL (ref 65–99)
Potassium: 4.2 mmol/L (ref 3.5–5.2)
Sodium: 141 mmol/L (ref 134–144)
TOTAL PROTEIN: 7 g/dL (ref 6.0–8.5)

## 2018-07-08 ENCOUNTER — Other Ambulatory Visit: Payer: Self-pay | Admitting: Family

## 2018-07-09 NOTE — Telephone Encounter (Signed)
Last Vit D  01/11/18    19.0

## 2018-08-13 ENCOUNTER — Other Ambulatory Visit: Payer: Self-pay | Admitting: Family

## 2018-08-13 DIAGNOSIS — I10 Essential (primary) hypertension: Secondary | ICD-10-CM

## 2018-08-17 ENCOUNTER — Other Ambulatory Visit: Payer: Self-pay | Admitting: Family

## 2018-08-17 DIAGNOSIS — I10 Essential (primary) hypertension: Secondary | ICD-10-CM

## 2018-09-08 ENCOUNTER — Other Ambulatory Visit: Payer: Self-pay | Admitting: Family

## 2018-09-08 DIAGNOSIS — F331 Major depressive disorder, recurrent, moderate: Secondary | ICD-10-CM

## 2018-09-08 DIAGNOSIS — F411 Generalized anxiety disorder: Secondary | ICD-10-CM

## 2018-11-05 ENCOUNTER — Ambulatory Visit (INDEPENDENT_AMBULATORY_CARE_PROVIDER_SITE_OTHER): Payer: Medicare HMO | Admitting: *Deleted

## 2018-11-05 DIAGNOSIS — Z23 Encounter for immunization: Secondary | ICD-10-CM

## 2018-11-07 ENCOUNTER — Other Ambulatory Visit: Payer: Self-pay | Admitting: Family

## 2018-11-07 DIAGNOSIS — I2510 Atherosclerotic heart disease of native coronary artery without angina pectoris: Secondary | ICD-10-CM

## 2018-11-07 DIAGNOSIS — E785 Hyperlipidemia, unspecified: Secondary | ICD-10-CM

## 2018-11-07 DIAGNOSIS — E8881 Metabolic syndrome: Secondary | ICD-10-CM

## 2018-12-07 ENCOUNTER — Other Ambulatory Visit: Payer: Self-pay | Admitting: Family

## 2018-12-07 DIAGNOSIS — J4520 Mild intermittent asthma, uncomplicated: Secondary | ICD-10-CM

## 2018-12-08 ENCOUNTER — Other Ambulatory Visit: Payer: Self-pay | Admitting: Family

## 2018-12-08 DIAGNOSIS — M1990 Unspecified osteoarthritis, unspecified site: Secondary | ICD-10-CM

## 2018-12-08 DIAGNOSIS — F331 Major depressive disorder, recurrent, moderate: Secondary | ICD-10-CM

## 2018-12-08 DIAGNOSIS — F411 Generalized anxiety disorder: Secondary | ICD-10-CM

## 2018-12-10 NOTE — Telephone Encounter (Signed)
OV 12/27/18 

## 2018-12-27 ENCOUNTER — Ambulatory Visit (INDEPENDENT_AMBULATORY_CARE_PROVIDER_SITE_OTHER): Payer: Medicare HMO | Admitting: Family

## 2018-12-27 ENCOUNTER — Encounter: Payer: Self-pay | Admitting: Family

## 2018-12-27 VITALS — BP 121/67 | HR 68 | Temp 96.7°F | Ht 69.0 in | Wt 191.4 lb

## 2018-12-27 DIAGNOSIS — R69 Illness, unspecified: Secondary | ICD-10-CM | POA: Diagnosis not present

## 2018-12-27 DIAGNOSIS — I2510 Atherosclerotic heart disease of native coronary artery without angina pectoris: Secondary | ICD-10-CM

## 2018-12-27 DIAGNOSIS — E538 Deficiency of other specified B group vitamins: Secondary | ICD-10-CM | POA: Diagnosis not present

## 2018-12-27 DIAGNOSIS — E559 Vitamin D deficiency, unspecified: Secondary | ICD-10-CM | POA: Diagnosis not present

## 2018-12-27 DIAGNOSIS — J4520 Mild intermittent asthma, uncomplicated: Secondary | ICD-10-CM | POA: Diagnosis not present

## 2018-12-27 DIAGNOSIS — F331 Major depressive disorder, recurrent, moderate: Secondary | ICD-10-CM | POA: Diagnosis not present

## 2018-12-27 DIAGNOSIS — I10 Essential (primary) hypertension: Secondary | ICD-10-CM

## 2018-12-27 DIAGNOSIS — F411 Generalized anxiety disorder: Secondary | ICD-10-CM

## 2018-12-27 DIAGNOSIS — M1990 Unspecified osteoarthritis, unspecified site: Secondary | ICD-10-CM

## 2018-12-27 DIAGNOSIS — E785 Hyperlipidemia, unspecified: Secondary | ICD-10-CM

## 2018-12-27 DIAGNOSIS — E663 Overweight: Secondary | ICD-10-CM | POA: Diagnosis not present

## 2018-12-27 NOTE — Patient Instructions (Signed)

## 2018-12-27 NOTE — Progress Notes (Signed)
Subjective:    Patient ID: Casey Klein, male    DOB: [birth year], 78 y.o.   MRN: [id number]  Chief Complaint  Patient presents with  . Medical Management of Chronic Issues    six month recheck   Pt presents to the office today for chronic follow up.  Hypertension  This is a chronic problem. The problem has been resolved since onset. The problem is controlled. Associated symptoms include anxiety and malaise/fatigue. Pertinent negatives include no peripheral edema or shortness of breath. Risk factors for coronary artery disease include dyslipidemia and male gender. The current treatment provides moderate improvement. Hypertensive end-organ damage includes CAD/MI.  Asthma  He complains of cough and wheezing. There is no shortness of breath. This is a chronic problem. The current episode started more than 1 year ago. The problem occurs intermittently. The problem has been waxing and waning. Associated symptoms include malaise/fatigue. His past medical history is significant for asthma.  Arthritis  Presents for follow-up visit. He complains of pain and stiffness. Affected locations include the right knee and left knee (back). His pain is at a severity of 8/10.  Hyperlipidemia  This is a chronic problem. The current episode started more than 1 year ago. The problem is uncontrolled. Recent lipid tests were reviewed and are high. Pertinent negatives include no shortness of breath. Current antihyperlipidemic treatment includes statins. The current treatment provides mild improvement of lipids. Risk factors for coronary artery disease include dyslipidemia, male sex, hypertension and a sedentary lifestyle.  Depression         This is a chronic problem.  The current episode started more than 1 year ago.   The onset quality is gradual.   The problem occurs intermittently.  Associated symptoms include decreased interest and sad.  Associated symptoms include no helplessness and no hopelessness.  Past  treatments include SSRIs - Selective serotonin reuptake inhibitors.  Past medical history includes anxiety.   Anxiety  Presents for follow-up visit. Symptoms include depressed mood, excessive worry, irritability and nervous/anxious behavior. Patient reports no shortness of breath. Symptoms occur most days. The severity of symptoms is moderate.   His past medical history is significant for asthma.      Review of Systems  Constitutional: Positive for irritability and malaise/fatigue.  Respiratory: Positive for cough and wheezing. Negative for shortness of breath.   Musculoskeletal: Positive for arthritis and stiffness.  Psychiatric/Behavioral: Positive for depression. The patient is nervous/anxious.   All other systems reviewed and are negative.      Objective:   Physical Exam Vitals signs reviewed.  Constitutional:      General: He is not in acute distress.    Appearance: He is well-developed.  HENT:     Head: Normocephalic.     Right Ear: Tympanic membrane normal.     Left Ear: Tympanic membrane normal.  Eyes:     General:        Right eye: No discharge.        Left eye: No discharge.     Pupils: Pupils are equal, round, and reactive to light.  Neck:     Musculoskeletal: Normal range of motion and neck supple.     Thyroid: No thyromegaly.  Cardiovascular:     Rate and Rhythm: Normal rate and regular rhythm.     Heart sounds: Normal heart sounds. No murmur.  Pulmonary:     Effort: Pulmonary effort is normal. No respiratory distress.     Breath sounds: Normal breath sounds. No wheezing.  Abdominal:     General: Bowel sounds are normal. There is no distension.     Palpations: Abdomen is soft.     Tenderness: There is no abdominal tenderness.  Musculoskeletal: Normal range of motion.        General: No tenderness.     Comments: Mild lumbar pain with flexion  Skin:    General: Skin is warm and dry.     Findings: No erythema or rash.  Neurological:     Mental Status: He  is alert and oriented to person, place, and time.     Cranial Nerves: No cranial nerve deficit.     Deep Tendon Reflexes: Reflexes are normal and symmetric.  Psychiatric:        Behavior: Behavior normal.        Thought Content: Thought content normal.        Judgment: Judgment normal.       BP 121/67   Pulse 68   Temp (!) 96.7 F (35.9 C) (Oral)   Ht _0  (1.753 m)   Wt 191 lb 6.4 oz (86.8 kg)   BMI 28.26 kg/m      Assessment & Plan:  RIEL HIRSCHMAN comes in today with chief complaint of Medical Management of Chronic Issues (six month recheck)   Diagnosis and orders addressed:  1. Essential hypertension, benign - CMP14+EGFR - CBC with Differential/Platelet  2. Coronary artery disease involving native coronary artery of native heart without angina pectoris - CMP14+EGFR - CBC with Differential/Platelet  3. Mild intermittent asthma without complication - EZV47+FTNB - CBC with Differential/Platelet  4. Moderate episode of recurrent major depressive disorder (HCC) - CMP14+EGFR - CBC with Differential/Platelet  5. Arthritis - CMP14+EGFR - CBC with Differential/Platelet  6. GAD (generalized anxiety disorder) - CMP14+EGFR - CBC with Differential/Platelet  7. Vitamin D deficiency - CMP14+EGFR - CBC with Differential/Platelet - VITAMIN D 25 Hydroxy (Vit-D Deficiency, Fractures)  8. Hyperlipidemia, unspecified hyperlipidemia type - CMP14+EGFR - CBC with Differential/Platelet  9. Vitamin B 12 deficiency - CMP14+EGFR - CBC with Differential/Platelet - Vitamin B12  10. Overweight (BMI 25.0-29.9) - CMP14+EGFR - CBC with Differential/Platelet   Labs pending Health Maintenance reviewed Diet and exercise encouraged  Follow up plan: 6 months    Evelina Dun, FNP

## 2018-12-28 ENCOUNTER — Other Ambulatory Visit: Payer: Self-pay | Admitting: Family

## 2018-12-28 ENCOUNTER — Encounter: Payer: Self-pay | Admitting: *Deleted

## 2018-12-28 LAB — CMP14+EGFR
A/G RATIO: 2 (ref 1.2–2.2)
ALK PHOS: 45 IU/L (ref 39–117)
ALT: 17 IU/L (ref 0–44)
AST: 15 IU/L (ref 0–40)
Albumin: 4.6 g/dL (ref 3.7–4.7)
BILIRUBIN TOTAL: 0.9 mg/dL (ref 0.0–1.2)
BUN/Creatinine Ratio: 15 (ref 10–24)
BUN: 14 mg/dL (ref 8–27)
CHLORIDE: 104 mmol/L (ref 96–106)
CO2: 23 mmol/L (ref 20–29)
Calcium: 9.2 mg/dL (ref 8.6–10.2)
Creatinine, Ser: 0.95 mg/dL (ref 0.76–1.27)
GFR calc Af Amer: 88 mL/min/{1.73_m2} (ref >59)
GFR calc non Af Amer: 76 mL/min/{1.73_m2} (ref >59)
GLUCOSE: 73 mg/dL (ref 65–99)
Globulin, Total: 2.3 g/dL (ref 1.5–4.5)
POTASSIUM: 4.1 mmol/L (ref 3.5–5.2)
Sodium: 144 mmol/L (ref 134–144)
Total Protein: 6.9 g/dL (ref 6.0–8.5)

## 2018-12-28 LAB — CBC WITH DIFFERENTIAL/PLATELET
BASOS ABS: 0.1 10*3/uL (ref 0.0–0.2)
Basos: 1 %
EOS (ABSOLUTE): 0.2 10*3/uL (ref 0.0–0.4)
Eos: 1 %
Hematocrit: 40.8 % (ref 37.5–51.0)
Hemoglobin: 14.2 g/dL (ref 13.0–17.7)
IMMATURE GRANS (ABS): 0 10*3/uL (ref 0.0–0.1)
IMMATURE GRANULOCYTES: 0 %
Lymphocytes Absolute: 2.1 10*3/uL (ref 0.7–3.1)
Lymphs: 20 %
MCH: 32.1 pg (ref 26.6–33.0)
MCHC: 34.8 g/dL (ref 31.5–35.7)
MCV: 92 fL (ref 79–97)
Monocytes Absolute: 0.9 10*3/uL (ref 0.1–0.9)
Monocytes: 8 %
NEUTROS ABS: 7.3 10*3/uL — AB (ref 1.4–7.0)
NEUTROS PCT: 70 %
PLATELETS: 246 10*3/uL (ref 150–450)
RBC: 4.42 x10E6/uL (ref 4.14–5.80)
RDW: 17 % — ABNORMAL HIGH (ref 11.6–15.4)
WBC: 10.5 10*3/uL (ref 3.4–10.8)

## 2018-12-28 LAB — VITAMIN B12: VITAMIN B 12: 391 pg/mL (ref 232–1245)

## 2018-12-28 LAB — VITAMIN D 25 HYDROXY (VIT D DEFICIENCY, FRACTURES): VIT D 25 HYDROXY: 24.8 ng/mL — AB (ref 30.0–100.0)

## 2018-12-28 MED ORDER — VITAMIN D (ERGOCALCIFEROL) 1.25 MG (50000 UNIT) PO CAPS: 50000.0000 [IU] | ORAL_CAPSULE | ORAL | 3 refills | Status: DC

## 2019-01-01 ENCOUNTER — Ambulatory Visit (INDEPENDENT_AMBULATORY_CARE_PROVIDER_SITE_OTHER): Payer: Medicare HMO | Admitting: *Deleted

## 2019-01-01 VITALS — BP 113/68 | HR 67 | Ht 69.0 in | Wt 190.8 lb

## 2019-01-01 DIAGNOSIS — Z0000 Encounter for general adult medical examination without abnormal findings: Secondary | ICD-10-CM | POA: Diagnosis not present

## 2019-01-01 NOTE — Patient Instructions (Signed)
Casey Klein , Thank you for taking time to come for your Medicare Wellness Visit. I appreciate your ongoing commitment to your health goals. Please review the following plan we discussed and let me know if I can assist you in the future.   These are the goals we discussed: Goals    . Exercise 3x per week (30 min per time)     Try to exercise for at least 30 minutes 3 times weekly    . Have 3 meals a day     Try to eat at least 3 meals daily that consist of lean proteins, fruits, and vegetables.  Decrease soda and tea intake Increase water intake        This is a list of the screening recommended for you and due dates:  Health Maintenance  Topic Date Due  . Pneumonia vaccines (2 of 2 - PCV13) 07/27/2027*  . Tetanus Vaccine  01/26/2023  . Flu Shot  Completed  *Topic was postponed. The date shown is not the original due date.     Advance Directive  Advance directives are legal documents that let you make choices ahead of time about your health care and medical treatment in case you become unable to communicate for yourself. Advance directives are a way for you to communicate your wishes to family, friends, and health care providers. This can help convey your decisions about end-of-life care if you become unable to communicate. Discussing and writing advance directives should happen over time rather than all at once. Advance directives can be changed depending on your situation and what you want, even after you have signed the advance directives. If you do not have an advance directive, some states assign family decision makers to act on your behalf based on how closely you are related to them. Each state has its own laws regarding advance directives. You may want to check with your health care provider, attorney, or state representative about the laws in your state. There are different types of advance directives, such as:  Medical power of attorney.  Living will.  Do not  resuscitate (DNR) or do not attempt resuscitation (DNAR) order. Health care proxy and medical power of attorney A health care proxy, also called a health care agent, is a person who is appointed to make medical decisions for you in cases in which you are unable to make the decisions yourself. Generally, people choose someone they know well and trust to represent their preferences. Make sure to ask this person for an agreement to act as your proxy. A proxy may have to exercise judgment in the event of a medical decision for which your wishes are not known. A medical power of attorney is a legal document that names your health care proxy. Depending on the laws in your state, after the document is written, it may also need to be:  Signed.  Notarized.  Dated.  Copied.  Witnessed.  Incorporated into your medical record. You may also want to appoint someone to manage your financial affairs in a situation in which you are unable to do so. This is called a durable power of attorney for finances. It is a separate legal document from the durable power of attorney for health care. You may choose the same person or someone different from your health care proxy to act as your agent in financial matters. If you do not appoint a proxy, or if there is a concern that the proxy is not acting in your  best interests, a court-appointed guardian may be designated to act on your behalf. Living will A living will is a set of instructions documenting your wishes about medical care when you cannot express them yourself. Health care providers should keep a copy of your living will in your medical record. You may want to give a copy to family members or friends. To alert caregivers in case of an emergency, you can place a card in your wallet to let them know that you have a living will and where they can find it. A living will is used if you become:  Terminally ill.  Incapacitated.  Unable to communicate or make  decisions. Items to consider in your living will include:  The use or non-use of life-sustaining equipment, such as dialysis machines and breathing machines (ventilators).  A DNR or DNAR order, which is the instruction not to use cardiopulmonary resuscitation (CPR) if breathing or heartbeat stops.  The use or non-use of tube feeding.  Withholding of food and fluids.  Comfort (palliative) care when the goal becomes comfort rather than a cure.  Organ and tissue donation. A living will does not give instructions for distributing your money and property if you should pass away. It is recommended that you seek the advice of a lawyer when writing a will. Decisions about taxes, beneficiaries, and asset distribution will be legally binding. This process can relieve your family and friends of any concerns surrounding disputes or questions that may come up about the distribution of your assets. DNR or DNAR A DNR or DNAR order is a request not to have CPR in the event that your heart stops beating or you stop breathing. If a DNR or DNAR order has not been made and shared, a health care provider will try to help any patient whose heart has stopped or who has stopped breathing. If you plan to have surgery, talk with your health care provider about how your DNR or DNAR order will be followed if problems occur. Summary  Advance directives are the legal documents that allow you to make choices ahead of time about your health care and medical treatment in case you become unable to communicate for yourself.  The process of discussing and writing advance directives should happen over time. You can change the advance directives, even after you have signed them.  Advance directives include DNR or DNAR orders, living wills, and designating an agent as your medical power of attorney. This information is not intended to replace advice given to you by your health care provider. Make sure you discuss any questions you  have with your health care provider. Document Released: 02/28/2008 Document Revised: 10/10/2016 Document Reviewed: 10/10/2016 Elsevier Interactive Patient Education  2019 [employer].

## 2019-01-01 NOTE — Progress Notes (Signed)
Subjective:   Casey Klein is a 78 y.o. male who presents for Medicare Annual/Subsequent preventive examination.  Mr. Casey Klein is retired from [employer] of 30 years.  He enjoys playing baseball and hunting when he feels "up to it".  He live at home alone but has a daughter, 2 sons and 5 grandchildren that visits often.  He eats 2 meals daily that consist of pancakes, toast, sausage, eggs and sandwiches. Mr. Casey Klein has had no falls, ER visits, surgeries or hospitalizations. Overall he feels that his health is the same as it was a year ago.     Objective:    Vitals: BP 113/68   Pulse 67   Ht 5\' 9"  (1.753 m)   Wt 190 lb 12.8 oz (86.5 kg)   BMI 28.18 kg/m   Body mass index is 28.18 kg/m.  Advanced Directives 01/01/2019 01/11/2017 08/14/2015 06/18/2013 05/06/2013 04/30/2013  Does Patient Have a Medical Advance Directive? No Yes No Patient would not like information;Patient does not have advance directive Patient does not have advance directive Patient would not like information;Patient does not have advance directive  Type of Advance Directive - Healthcare Power of Attorney - - - -  Does patient want to make changes to medical advance directive? - No - Patient declined - - - -  Copy of Healthcare Power of Attorney in Chart? - No - copy requested - - - -  Would patient like information on creating a medical advance directive? Yes (MAU/Ambulatory/Procedural Areas - Information given) - Yes - Educational materials given - - -  Pre-existing out of facility DNR order (yellow form or pink MOST form) - - - No No No    Tobacco Social History   Tobacco Use  Smoking Status Former Smoker  . Packs/day: 1.00  . Years: 10.00  . Pack years: 10.00  . Types: Cigarettes  . Start date: 02/07/1983  Smokeless Tobacco Never Used     Counseling given: Not Answered   Clinical Intake:  Pre-visit preparation completed: Yes  Pain : No/denies pain     BMI - recorded: 28.26 Nutritional Status: BMI 25 -29  Overweight Nutritional Risks: None Diabetes: No  How often do you need to have someone help you when you read instructions, pamphlets, or other written materials from your doctor or pharmacy?: 1 - Never  Interpreter Needed?: No  Information entered by :: Josetta Huddlehanda Damien Batty, LPN  Past Medical History:  Diagnosis Date  . Allergy   . Asthma   . CAD (coronary artery disease)    Nonobstructive  . Carotid stenosis    50% bilateral 2012  . Cataract   . History of kidney stones   . HTN (hypertension)   . PAT (paroxysmal atrial tachycardia) (HCC)    Past Surgical History:  Procedure Laterality Date  . BACK SURGERY     lumbar disc  . CATARACT EXTRACTION W/PHACO Right 05/06/2013   Procedure: CATARACT EXTRACTION PHACO AND INTRAOCULAR LENS PLACEMENT (IOC);  Surgeon: Susa Simmondsarroll F Haines, MD;  Location: [facility];  Service: Ophthalmology;  Laterality: Right;  CDE: 19.84  . CATARACT EXTRACTION W/PHACO Left 06/24/2013   Procedure: CATARACT EXTRACTION PHACO AND INTRAOCULAR LENS PLACEMENT (IOC);  Surgeon: Susa Simmondsarroll F Haines, MD;  Location: [facility];  Service: Ophthalmology;  Laterality: Left;  CDE:  18.21  . CERVICAL DISC SURGERY    . EYE SURGERY     History reviewed. No pertinent family history. Social History   Socioeconomic History  . Marital status: Widowed  Spouse name: Not on file  . Number of children: Not on file  . Years of education: Not on file  . Highest education level: Not on file  Occupational History  . Not on file  Social Needs  . Financial resource strain: Not on file  . Food insecurity:    Worry: Not on file    Inability: Not on file  . Transportation needs:    Medical: Not on file    Non-medical: Not on file  Tobacco Use  . Smoking status: Former Smoker    Packs/day: 1.00    Years: 10.00    Pack years: 10.00    Types: Cigarettes    Start date: 02/07/1983  . Smokeless tobacco: Never Used  Substance and Sexual Activity  . Alcohol use: No  . Drug use: No  . Sexual  activity: Never    Birth control/protection: None  Lifestyle  . Physical activity:    Days per week: Not on file    Minutes per session: Not on file  . Stress: Not on file  Relationships  . Social connections:    Talks on phone: Not on file    Gets together: Not on file    Attends religious service: Not on file    Active member of club or organization: Not on file    Attends meetings of clubs or organizations: Not on file    Relationship status: Not on file  Other Topics Concern  . Not on file  Social History Narrative  . Not on file    Outpatient Encounter Medications as of 01/01/2019  Medication Sig  . amLODipine (NORVASC) 10 MG tablet TAKE 1 TABLET BY MOUTH EVERY DAY  . aspirin EC 81 MG tablet Take 1 tablet (81 mg total) by mouth daily.  Marland Kitchen atorvastatin (LIPITOR) 10 MG tablet TAKE 1 TABLET BY MOUTH EVERY DAY  . budesonide-formoterol (SYMBICORT) 80-4.5 MCG/ACT inhaler TAKE 2 PUFFS BY MOUTH TWICE A DAY  . citalopram (CELEXA) 40 MG tablet TAKE 1 TABLET BY MOUTH EVERY DAY  . lisinopril (PRINIVIL,ZESTRIL) 10 MG tablet Take 1 tablet (10 mg total) by mouth daily.  . meloxicam (MOBIC) 15 MG tablet TAKE 1 TABLET BY MOUTH EVERY DAY  . montelukast (SINGULAIR) 10 MG tablet TAKE 1 TABLET (10 MG TOTAL) BY MOUTH AT BEDTIME.  Marland Kitchen Vitamin D, Ergocalciferol, (DRISDOL) 1.25 MG (50000 UT) CAPS capsule Take 1 capsule (50,000 Units total) by mouth every 7 (seven) days.   No facility-administered encounter medications on file as of 01/01/2019.     Activities of Daily Living In your present state of health, do you have any difficulty performing the following activities: 01/01/2019  Hearing? Y  Vision? N  Difficulty concentrating or making decisions? Y  Walking or climbing stairs? Y  Dressing or bathing? N  Doing errands, shopping? N  Some recent data might be hidden    Patient Care Team: Junie Spencer, FNP as PCP - General (Nurse Practitioner) Rollene Rotunda, MD as Consulting Physician  (Cardiology)   Assessment:   This is a routine wellness examination for Casey Klein.  Exercise Activities and Dietary recommendations Current Exercise Habits: Home exercise routine, Type of exercise: walking  Goals    . Exercise 3x per week (30 min per time)     Try to exercise for at least 30 minutes 3 times weekly    . Have 3 meals a day     Try to eat at least 3 meals daily that consist of lean proteins,  fruits, and vegetables.  Decrease soda and tea intake Increase water intake        Fall Risk Fall Risk  01/01/2019 01/01/2019 12/27/2018 06/26/2018 05/15/2018  Falls in the past year? 0 0 0 No No  Number falls in past yr: 0 - - - -  Injury with Fall? 0 - - - -  Comment - - - - -  Risk Factor Category  - - - - -  Risk for fall due to : Impaired balance/gait - - - -  Follow up - - - - -   Is the patient's home free of loose throw rugs in walkways, pet beds, electrical cords, etc?   yes      Grab bars in the bathroom? yes      Handrails on the stairs?   yes      Adequate lighting?   yes  Timed Get Up and Go Performed:   Depression Screen PHQ 2/9 Scores 01/01/2019 12/27/2018 06/26/2018 05/15/2018  PHQ - 2 Score 0 0 0 0  PHQ- 9 Score - - - -    Cognitive Function MMSE - Mini Mental State Exam 01/01/2019 01/11/2017 08/14/2015  Orientation to time 4 5 5   Orientation to Place 5 5 5   Registration 3 3 3   Attention/ Calculation 0 5 0  Recall 0 3 3  Language- name 2 objects 2 2 2   Language- repeat 1 1 1   Language- follow 3 step command 2 3 3   Language- read & follow direction 0 1 1  Write a sentence 0 1 1  Copy design 1 1 1   Total score 18 30 25     Memory loss noted at this time    Immunization History  Administered Date(s) Administered  . Influenza, High Dose Seasonal PF 09/06/2013, 08/30/2016, 09/11/2017, 11/05/2018  . Influenza,inj,Quad PF,6+ Mos 09/17/2014, 09/01/2015  . Pneumococcal-Unspecified 09/06/2013  . Zoster 05/17/2016    Qualifies for Shingles  Vaccine?  Screening Tests Health Maintenance  Topic Date Due  . PNA vac Low Risk Adult (2 of 2 - PCV13) 07/27/2027 (Originally 09/06/2014)  . TETANUS/TDAP  01/26/2023  . INFLUENZA VACCINE  Completed   Cancer Screenings: Lung: Low Dose CT Chest recommended if Age 64-80 years, 30 pack-year currently smoking OR have quit w/in 15years. Patient does not qualify. Colorectal: exceed recommend age  Additional Screenings: Hepatitis C Screening:      Plan:  Encouraged patient to eat 3 healthy meals daily that consist of lean proteins, fruits and vegetables.  Encouraged to try to exercise for at least 30 minutes 3 times weekly.  Encouraged to look over advance directive packet and bring a signed notarized copy to this office to be scanned into chart.  Explained the importance of shingrix vaccine and pneumonia vaccine, patient declined today.  Encouraged to keep follow up appointments with Jannifer Rodneyhristy Hawks and Dr. Antoine PocheHochrein I have personally reviewed and noted the following in the patient's chart:   . Medical and social history . Use of alcohol, tobacco or illicit drugs  . Current medications and supplements . Functional ability and status . Nutritional status . Physical activity . Advanced directives . List of other physicians . Hospitalizations, surgeries, and ER visits in previous 12 months . Vitals . Screenings to include cognitive, depression, and falls . Referrals and appointments  In addition, I have reviewed and discussed with patient certain preventive protocols, quality metrics, and best practice recommendations. A written personalized care plan for preventive services as well as general preventive health  recommendations were provided to patient.     Caryl Bis, LPN  5/80/9983   I have reviewed and agree with the above AWV documentation.   Jannifer Rodney, FNP

## 2019-01-11 ENCOUNTER — Other Ambulatory Visit: Payer: Self-pay | Admitting: Family

## 2019-01-11 DIAGNOSIS — F411 Generalized anxiety disorder: Secondary | ICD-10-CM

## 2019-01-11 DIAGNOSIS — F331 Major depressive disorder, recurrent, moderate: Secondary | ICD-10-CM

## 2019-01-19 ENCOUNTER — Other Ambulatory Visit: Payer: Self-pay | Admitting: Family

## 2019-01-19 DIAGNOSIS — M1990 Unspecified osteoarthritis, unspecified site: Secondary | ICD-10-CM

## 2019-01-31 ENCOUNTER — Other Ambulatory Visit: Payer: Self-pay | Admitting: Family

## 2019-01-31 DIAGNOSIS — E785 Hyperlipidemia, unspecified: Secondary | ICD-10-CM

## 2019-01-31 DIAGNOSIS — I2510 Atherosclerotic heart disease of native coronary artery without angina pectoris: Secondary | ICD-10-CM

## 2019-01-31 DIAGNOSIS — E8881 Metabolic syndrome: Secondary | ICD-10-CM

## 2019-01-31 NOTE — Telephone Encounter (Signed)
Last lipid 05/15/18

## 2019-02-04 ENCOUNTER — Other Ambulatory Visit: Payer: Self-pay | Admitting: Family

## 2019-02-04 DIAGNOSIS — I2510 Atherosclerotic heart disease of native coronary artery without angina pectoris: Secondary | ICD-10-CM

## 2019-02-04 DIAGNOSIS — I10 Essential (primary) hypertension: Secondary | ICD-10-CM

## 2019-04-14 ENCOUNTER — Other Ambulatory Visit: Payer: Self-pay | Admitting: Family

## 2019-04-14 DIAGNOSIS — M1990 Unspecified osteoarthritis, unspecified site: Secondary | ICD-10-CM

## 2019-04-26 ENCOUNTER — Other Ambulatory Visit: Payer: Self-pay | Admitting: Family

## 2019-04-26 DIAGNOSIS — E785 Hyperlipidemia, unspecified: Secondary | ICD-10-CM

## 2019-04-26 DIAGNOSIS — I2510 Atherosclerotic heart disease of native coronary artery without angina pectoris: Secondary | ICD-10-CM

## 2019-04-26 DIAGNOSIS — E8881 Metabolic syndrome: Secondary | ICD-10-CM

## 2019-07-01 ENCOUNTER — Encounter: Payer: Self-pay | Admitting: Family

## 2019-07-01 ENCOUNTER — Ambulatory Visit (INDEPENDENT_AMBULATORY_CARE_PROVIDER_SITE_OTHER): Payer: Medicare HMO | Admitting: Family

## 2019-07-01 DIAGNOSIS — I2510 Atherosclerotic heart disease of native coronary artery without angina pectoris: Secondary | ICD-10-CM | POA: Diagnosis not present

## 2019-07-01 DIAGNOSIS — E538 Deficiency of other specified B group vitamins: Secondary | ICD-10-CM | POA: Diagnosis not present

## 2019-07-01 DIAGNOSIS — J4520 Mild intermittent asthma, uncomplicated: Secondary | ICD-10-CM | POA: Diagnosis not present

## 2019-07-01 DIAGNOSIS — E785 Hyperlipidemia, unspecified: Secondary | ICD-10-CM | POA: Diagnosis not present

## 2019-07-01 DIAGNOSIS — E663 Overweight: Secondary | ICD-10-CM | POA: Diagnosis not present

## 2019-07-01 DIAGNOSIS — E559 Vitamin D deficiency, unspecified: Secondary | ICD-10-CM | POA: Diagnosis not present

## 2019-07-01 DIAGNOSIS — M1990 Unspecified osteoarthritis, unspecified site: Secondary | ICD-10-CM | POA: Diagnosis not present

## 2019-07-01 DIAGNOSIS — F411 Generalized anxiety disorder: Secondary | ICD-10-CM

## 2019-07-01 DIAGNOSIS — F331 Major depressive disorder, recurrent, moderate: Secondary | ICD-10-CM

## 2019-07-01 DIAGNOSIS — I10 Essential (primary) hypertension: Secondary | ICD-10-CM | POA: Diagnosis not present

## 2019-07-01 DIAGNOSIS — R69 Illness, unspecified: Secondary | ICD-10-CM | POA: Diagnosis not present

## 2019-07-01 NOTE — Addendum Note (Signed)
Addended by: Liliane Bade on: 07/01/2019 09:37 AM   Modules accepted: Orders

## 2019-07-01 NOTE — Progress Notes (Signed)
Virtual Visit via telephone Note  I connected with Casey Klein on 07/01/19 at 8:59 AM  by telephone and verified that I am speaking with the correct person using two identifiers. Casey Klein is currently located at home and no one is currently with her during visit. The provider, Evelina Dun, FNP is located in their office at time of visit.  I discussed the limitations, risks, security and privacy concerns of performing an evaluation and management service by telephone and the availability of in person appointments. I also discussed with the patient that there may be a patient responsible charge related to this service. The patient expressed understanding and agreed to proceed.   History and Present Illness:  Pt presents to the office today for chronic follow up. Hypertension This is a chronic problem. The current episode started more than 1 year ago. The problem has been resolved since onset. The problem is controlled. Associated symptoms include anxiety and peripheral edema ("after I get up and walk"). Pertinent negatives include no malaise/fatigue or shortness of breath. Risk factors for coronary artery disease include dyslipidemia, obesity, male gender and sedentary lifestyle. Hypertensive end-organ damage includes CAD/MI.  Asthma He complains of cough and wheezing. There is no frequent throat clearing, hemoptysis or shortness of breath. This is a chronic problem. The current episode started more than 1 year ago. The problem occurs intermittently. The problem has been waxing and waning. Pertinent negatives include no malaise/fatigue. His symptoms are alleviated by leukotriene antagonist. He reports moderate improvement on treatment. His past medical history is significant for asthma.  Arthritis Presents for follow-up visit. He complains of pain and stiffness. The symptoms have been stable. Affected locations include the right knee, left knee, right foot and left foot. His pain is  at a severity of 8/10.  Hyperlipidemia This is a chronic problem. The current episode started more than 1 year ago. The problem is uncontrolled. Recent lipid tests were reviewed and are normal. Pertinent negatives include no shortness of breath. Current antihyperlipidemic treatment includes statins. The current treatment provides moderate improvement of lipids. Risk factors for coronary artery disease include dyslipidemia, male sex and hypertension.  Anxiety Presents for follow-up visit. Symptoms include decreased concentration, depressed mood, excessive worry and nervous/anxious behavior. Patient reports no insomnia, irritability, restlessness or shortness of breath. Symptoms occur occasionally. The severity of symptoms is moderate.   His past medical history is significant for asthma.  Depression        This is a chronic problem.  The current episode started more than 1 year ago.   The onset quality is gradual.   The problem occurs intermittently.  Associated symptoms include decreased concentration, decreased interest and sad.  Associated symptoms include does not have insomnia, not irritable and no restlessness.  Past treatments include SSRIs - Selective serotonin reuptake inhibitors.  Past medical history includes anxiety.       Review of Systems  Constitutional: Negative for irritability and malaise/fatigue.  Respiratory: Positive for cough and wheezing. Negative for hemoptysis and shortness of breath.   Musculoskeletal: Positive for arthritis and stiffness.  Psychiatric/Behavioral: Positive for decreased concentration and depression. The patient is nervous/anxious. The patient does not have insomnia.   All other systems reviewed and are negative.    Observations/Objective: No SOB or distress noted   Assessment and Plan: Casey Klein comes in today with chief complaint of No chief complaint on file.   Diagnosis and orders addressed:  1. Essential hypertension, benign -  CMP14+EGFR;  Future - CBC with Differential/Platelet; Future  2. Coronary artery disease involving native coronary artery of native heart without angina pectoris - CMP14+EGFR; Future - CBC with Differential/Platelet; Future  3. Mild intermittent asthma without complication - VXB93+JQZE; Future - CBC with Differential/Platelet; Future  4. Arthritis - CMP14+EGFR; Future - CBC with Differential/Platelet; Future  5. Vitamin B 12 deficiency - CMP14+EGFR; Future - CBC with Differential/Platelet; Future  6. Vitamin D deficiency - CMP14+EGFR; Future - CBC with Differential/Platelet; Future  7. Overweight (BMI 25.0-29.9) - CMP14+EGFR; Future - CBC with Differential/Platelet; Future  8. Hyperlipidemia, unspecified hyperlipidemia type - CMP14+EGFR; Future - CBC with Differential/Platelet; Future - Lipid panel; Future  9. Moderate episode of recurrent major depressive disorder (HCC) - CMP14+EGFR; Future - CBC with Differential/Platelet; Future  10. GAD (generalized anxiety disorder) - CMP14+EGFR; Future - CBC with Differential/Platelet; Future   Labs pending Health Maintenance reviewed Diet and exercise encouraged  Follow up plan: 6 months      I discussed the assessment and treatment plan with the patient. The patient was provided an opportunity to ask questions and all were answered. The patient agreed with the plan and demonstrated an understanding of the instructions.   The patient was advised to call back or seek an in-person evaluation if the symptoms worsen or if the condition fails to improve as anticipated.  The above assessment and management plan was discussed with the patient. The patient verbalized understanding of and has agreed to the management plan. Patient is aware to call the clinic if symptoms persist or worsen. Patient is aware when to return to the clinic for a follow-up visit. Patient educated on when it is appropriate to go to the emergency department.    Time call ended:  9:14 AM  I provided 15 minutes of non-face-to-face time during this encounter.    Evelina Dun, FNP

## 2019-07-02 LAB — LIPID PANEL
Chol/HDL Ratio: 3.2 ratio (ref 0.0–5.0)
Cholesterol, Total: 132 mg/dL (ref 100–199)
HDL: 41 mg/dL (ref >39)
LDL Calculated: 51 mg/dL (ref 0–99)
Triglycerides: 200 mg/dL — ABNORMAL HIGH (ref 0–149)
VLDL Cholesterol Cal: 40 mg/dL (ref 5–40)

## 2019-07-02 LAB — CBC WITH DIFFERENTIAL/PLATELET
Basophils Absolute: 0.1 10*3/uL (ref 0.0–0.2)
Basos: 1 %
EOS (ABSOLUTE): 0.2 10*3/uL (ref 0.0–0.4)
Eos: 2 %
Hematocrit: 41.4 % (ref 37.5–51.0)
Hemoglobin: 13.8 g/dL (ref 13.0–17.7)
Immature Grans (Abs): 0.1 10*3/uL (ref 0.0–0.1)
Immature Granulocytes: 1 %
Lymphocytes Absolute: 2 10*3/uL (ref 0.7–3.1)
Lymphs: 19 %
MCH: 30.8 pg (ref 26.6–33.0)
MCHC: 33.3 g/dL (ref 31.5–35.7)
MCV: 92 fL (ref 79–97)
Monocytes Absolute: 0.9 10*3/uL (ref 0.1–0.9)
Monocytes: 8 %
Neutrophils Absolute: 7.4 10*3/uL — ABNORMAL HIGH (ref 1.4–7.0)
Neutrophils: 69 %
Platelets: 217 10*3/uL (ref 150–450)
RBC: 4.48 x10E6/uL (ref 4.14–5.80)
RDW: 17.6 % — ABNORMAL HIGH (ref 11.6–15.4)
WBC: 10.6 10*3/uL (ref 3.4–10.8)

## 2019-07-02 LAB — CMP14+EGFR
ALT: 13 IU/L (ref 0–44)
AST: 15 IU/L (ref 0–40)
Albumin/Globulin Ratio: 2.1 (ref 1.2–2.2)
Albumin: 4.4 g/dL (ref 3.7–4.7)
Alkaline Phosphatase: 42 IU/L (ref 39–117)
BUN/Creatinine Ratio: 10 (ref 10–24)
BUN: 11 mg/dL (ref 8–27)
Bilirubin Total: 1 mg/dL (ref 0.0–1.2)
CO2: 23 mmol/L (ref 20–29)
Calcium: 8.8 mg/dL (ref 8.6–10.2)
Chloride: 104 mmol/L (ref 96–106)
Creatinine, Ser: 1.05 mg/dL (ref 0.76–1.27)
GFR calc Af Amer: 78 mL/min/{1.73_m2} (ref >59)
GFR calc non Af Amer: 67 mL/min/{1.73_m2} (ref >59)
Globulin, Total: 2.1 g/dL (ref 1.5–4.5)
Glucose: 100 mg/dL — ABNORMAL HIGH (ref 65–99)
Potassium: 3.7 mmol/L (ref 3.5–5.2)
Sodium: 143 mmol/L (ref 134–144)
Total Protein: 6.5 g/dL (ref 6.0–8.5)

## 2019-07-07 ENCOUNTER — Other Ambulatory Visit: Payer: Self-pay | Admitting: Family

## 2019-07-07 DIAGNOSIS — M1990 Unspecified osteoarthritis, unspecified site: Secondary | ICD-10-CM

## 2019-07-10 ENCOUNTER — Other Ambulatory Visit: Payer: Self-pay | Admitting: Family

## 2019-07-10 DIAGNOSIS — F411 Generalized anxiety disorder: Secondary | ICD-10-CM

## 2019-07-10 DIAGNOSIS — F331 Major depressive disorder, recurrent, moderate: Secondary | ICD-10-CM

## 2019-07-22 ENCOUNTER — Other Ambulatory Visit: Payer: Self-pay | Admitting: Family

## 2019-07-22 DIAGNOSIS — I2510 Atherosclerotic heart disease of native coronary artery without angina pectoris: Secondary | ICD-10-CM

## 2019-07-22 DIAGNOSIS — E785 Hyperlipidemia, unspecified: Secondary | ICD-10-CM

## 2019-07-22 DIAGNOSIS — E8881 Metabolic syndrome: Secondary | ICD-10-CM

## 2019-08-01 ENCOUNTER — Other Ambulatory Visit: Payer: Self-pay | Admitting: Family

## 2019-08-01 DIAGNOSIS — I10 Essential (primary) hypertension: Secondary | ICD-10-CM

## 2019-08-01 DIAGNOSIS — I2510 Atherosclerotic heart disease of native coronary artery without angina pectoris: Secondary | ICD-10-CM

## 2019-09-13 ENCOUNTER — Ambulatory Visit (INDEPENDENT_AMBULATORY_CARE_PROVIDER_SITE_OTHER): Payer: Medicare HMO

## 2019-09-13 ENCOUNTER — Other Ambulatory Visit: Payer: Self-pay

## 2019-09-13 DIAGNOSIS — Z23 Encounter for immunization: Secondary | ICD-10-CM

## 2019-09-13 NOTE — Addendum Note (Signed)
Addended by: Michaela Corner on: 09/13/2019 02:56 PM   Modules accepted: Orders

## 2019-09-13 NOTE — Progress Notes (Signed)
Erroneous encounter

## 2019-09-26 ENCOUNTER — Ambulatory Visit: Payer: Medicare HMO

## 2019-10-21 DIAGNOSIS — S2231XA Fracture of one rib, right side, initial encounter for closed fracture: Secondary | ICD-10-CM | POA: Diagnosis not present

## 2019-10-21 DIAGNOSIS — R0781 Pleurodynia: Secondary | ICD-10-CM | POA: Diagnosis not present

## 2019-11-13 DIAGNOSIS — J45909 Unspecified asthma, uncomplicated: Secondary | ICD-10-CM | POA: Diagnosis not present

## 2019-11-13 DIAGNOSIS — Z7951 Long term (current) use of inhaled steroids: Secondary | ICD-10-CM | POA: Diagnosis not present

## 2019-11-13 DIAGNOSIS — Z880 Allergy status to penicillin: Secondary | ICD-10-CM | POA: Diagnosis not present

## 2019-11-13 DIAGNOSIS — E785 Hyperlipidemia, unspecified: Secondary | ICD-10-CM | POA: Diagnosis not present

## 2019-11-13 DIAGNOSIS — I10 Essential (primary) hypertension: Secondary | ICD-10-CM | POA: Diagnosis not present

## 2019-11-13 DIAGNOSIS — Z87891 Personal history of nicotine dependence: Secondary | ICD-10-CM | POA: Diagnosis not present

## 2019-11-13 DIAGNOSIS — R69 Illness, unspecified: Secondary | ICD-10-CM | POA: Diagnosis not present

## 2019-11-13 DIAGNOSIS — Z887 Allergy status to serum and vaccine status: Secondary | ICD-10-CM | POA: Diagnosis not present

## 2019-11-13 DIAGNOSIS — Z008 Encounter for other general examination: Secondary | ICD-10-CM | POA: Diagnosis not present

## 2019-11-22 ENCOUNTER — Other Ambulatory Visit: Payer: Self-pay | Admitting: Family

## 2020-01-06 ENCOUNTER — Other Ambulatory Visit: Payer: Self-pay | Admitting: Family

## 2020-01-06 DIAGNOSIS — F331 Major depressive disorder, recurrent, moderate: Secondary | ICD-10-CM

## 2020-01-06 DIAGNOSIS — F411 Generalized anxiety disorder: Secondary | ICD-10-CM

## 2020-01-18 ENCOUNTER — Other Ambulatory Visit: Payer: Self-pay | Admitting: Family

## 2020-01-18 DIAGNOSIS — E785 Hyperlipidemia, unspecified: Secondary | ICD-10-CM

## 2020-01-18 DIAGNOSIS — I2510 Atherosclerotic heart disease of native coronary artery without angina pectoris: Secondary | ICD-10-CM

## 2020-01-18 DIAGNOSIS — E8881 Metabolic syndrome: Secondary | ICD-10-CM

## 2020-01-28 ENCOUNTER — Other Ambulatory Visit: Payer: Self-pay | Admitting: Family

## 2020-01-28 DIAGNOSIS — F331 Major depressive disorder, recurrent, moderate: Secondary | ICD-10-CM

## 2020-01-28 DIAGNOSIS — F411 Generalized anxiety disorder: Secondary | ICD-10-CM

## 2020-01-31 ENCOUNTER — Other Ambulatory Visit: Payer: Self-pay | Admitting: Family

## 2020-01-31 DIAGNOSIS — M1990 Unspecified osteoarthritis, unspecified site: Secondary | ICD-10-CM

## 2020-01-31 NOTE — Telephone Encounter (Signed)
Needs to make an appointment for refills

## 2020-02-02 ENCOUNTER — Other Ambulatory Visit: Payer: Self-pay | Admitting: Family

## 2020-02-02 DIAGNOSIS — F411 Generalized anxiety disorder: Secondary | ICD-10-CM

## 2020-02-02 DIAGNOSIS — F331 Major depressive disorder, recurrent, moderate: Secondary | ICD-10-CM

## 2020-02-03 ENCOUNTER — Other Ambulatory Visit: Payer: Self-pay | Admitting: Family

## 2020-02-03 DIAGNOSIS — I2510 Atherosclerotic heart disease of native coronary artery without angina pectoris: Secondary | ICD-10-CM

## 2020-02-03 DIAGNOSIS — I10 Essential (primary) hypertension: Secondary | ICD-10-CM

## 2020-02-15 ENCOUNTER — Other Ambulatory Visit: Payer: Self-pay | Admitting: Family

## 2020-02-15 DIAGNOSIS — E8881 Metabolic syndrome: Secondary | ICD-10-CM

## 2020-02-15 DIAGNOSIS — I2510 Atherosclerotic heart disease of native coronary artery without angina pectoris: Secondary | ICD-10-CM

## 2020-02-15 DIAGNOSIS — E785 Hyperlipidemia, unspecified: Secondary | ICD-10-CM

## 2020-02-17 NOTE — Telephone Encounter (Signed)
[facility]. NTBS 30 days given 01/20/20

## 2020-02-17 NOTE — Telephone Encounter (Signed)
Appt made/ pt aware  

## 2020-02-20 ENCOUNTER — Ambulatory Visit (INDEPENDENT_AMBULATORY_CARE_PROVIDER_SITE_OTHER): Payer: Medicare HMO | Admitting: Family

## 2020-02-20 ENCOUNTER — Other Ambulatory Visit: Payer: Self-pay

## 2020-02-20 ENCOUNTER — Encounter: Payer: Self-pay | Admitting: Family

## 2020-02-20 VITALS — BP 165/102 | HR 95 | Temp 98.9°F | Ht 69.0 in | Wt 198.4 lb

## 2020-02-20 DIAGNOSIS — E785 Hyperlipidemia, unspecified: Secondary | ICD-10-CM | POA: Diagnosis not present

## 2020-02-20 DIAGNOSIS — I10 Essential (primary) hypertension: Secondary | ICD-10-CM

## 2020-02-20 DIAGNOSIS — I2510 Atherosclerotic heart disease of native coronary artery without angina pectoris: Secondary | ICD-10-CM

## 2020-02-20 DIAGNOSIS — J4520 Mild intermittent asthma, uncomplicated: Secondary | ICD-10-CM | POA: Diagnosis not present

## 2020-02-20 DIAGNOSIS — M1990 Unspecified osteoarthritis, unspecified site: Secondary | ICD-10-CM

## 2020-02-20 DIAGNOSIS — E8881 Metabolic syndrome: Secondary | ICD-10-CM

## 2020-02-20 DIAGNOSIS — E559 Vitamin D deficiency, unspecified: Secondary | ICD-10-CM

## 2020-02-20 DIAGNOSIS — R296 Repeated falls: Secondary | ICD-10-CM

## 2020-02-20 DIAGNOSIS — F331 Major depressive disorder, recurrent, moderate: Secondary | ICD-10-CM

## 2020-02-20 DIAGNOSIS — F411 Generalized anxiety disorder: Secondary | ICD-10-CM

## 2020-02-20 DIAGNOSIS — R69 Illness, unspecified: Secondary | ICD-10-CM | POA: Diagnosis not present

## 2020-02-20 MED ORDER — ATORVASTATIN CALCIUM 10 MG PO TABS: 10.0000 mg | ORAL_TABLET | Freq: Every day | ORAL | 2 refills | Status: DC

## 2020-02-20 MED ORDER — LISINOPRIL 10 MG PO TABS: 10.0000 mg | ORAL_TABLET | Freq: Every day | ORAL | 2 refills | Status: DC

## 2020-02-20 MED ORDER — CITALOPRAM HYDROBROMIDE 40 MG PO TABS: 40.0000 mg | ORAL_TABLET | Freq: Every day | ORAL | 2 refills | Status: DC

## 2020-02-20 MED ORDER — BUDESONIDE-FORMOTEROL FUMARATE 80-4.5 MCG/ACT IN AERO: INHALATION_SPRAY | RESPIRATORY_TRACT | 0 refills | Status: DC

## 2020-02-20 MED ORDER — AMLODIPINE BESYLATE 10 MG PO TABS: 10.0000 mg | ORAL_TABLET | Freq: Every day | ORAL | 2 refills | Status: DC

## 2020-02-20 MED ORDER — MONTELUKAST SODIUM 10 MG PO TABS: 10.0000 mg | ORAL_TABLET | Freq: Every day | ORAL | 4 refills | Status: DC

## 2020-02-20 MED ORDER — VITAMIN D (ERGOCALCIFEROL) 1.25 MG (50000 UNIT) PO CAPS: 50000.0000 [IU] | ORAL_CAPSULE | ORAL | 3 refills | Status: DC

## 2020-02-20 MED ORDER — MELOXICAM 15 MG PO TABS: 15.0000 mg | ORAL_TABLET | Freq: Every day | ORAL | 2 refills | Status: DC

## 2020-02-20 NOTE — Patient Instructions (Signed)

## 2020-02-20 NOTE — Progress Notes (Signed)
Subjective:    Patient ID: Casey Klein, male    DOB: Oct 05, 1941, 79 y.o.   MRN: [id number]  Chief Complaint  Patient presents with  . Medical Management of Chronic Issues    Not been taking amalodipine   Pt presents to the office today for chronic follow up. Pt reports he is falling about 2-3 times a month over the last year.  Hypertension This is a chronic problem. The current episode started more than 1 year ago. The problem has been waxing and waning since onset. The problem is uncontrolled. Associated symptoms include anxiety. Pertinent negatives include no malaise/fatigue, peripheral edema or shortness of breath. Risk factors for coronary artery disease include dyslipidemia, obesity and sedentary lifestyle. The current treatment provides moderate improvement. Hypertensive end-organ damage includes CAD/MI.  Asthma He complains of wheezing. There is no cough or shortness of breath. This is a chronic problem. The current episode started more than 1 year ago. The problem occurs intermittently. The problem has been waxing and waning. Pertinent negatives include no malaise/fatigue. His symptoms are aggravated by exercise. His symptoms are alleviated by rest and steroid inhaler. He reports moderate improvement on treatment. His past medical history is significant for asthma.  Arthritis Presents for follow-up visit. He complains of pain and stiffness. The symptoms have been stable. Affected location: back. His pain is at a severity of 5/10.  Hyperlipidemia This is a chronic problem. The current episode started more than 1 year ago. The problem is controlled. Recent lipid tests were reviewed and are normal. Exacerbating diseases include obesity. Pertinent negatives include no shortness of breath. Current antihyperlipidemic treatment includes statins. The current treatment provides moderate improvement of lipids. Risk factors for coronary artery disease include dyslipidemia, male sex and  hypertension.  Anxiety Presents for follow-up visit. Symptoms include excessive worry, irritability, nervous/anxious behavior and restlessness. Patient reports no shortness of breath. Symptoms occur occasionally.   His past medical history is significant for asthma.  Depression        This is a chronic problem.  The current episode started more than 1 year ago.   The onset quality is gradual.   Associated symptoms include restlessness.  Associated symptoms include no helplessness, no hopelessness, not irritable and not sad.  Past treatments include SSRIs - Selective serotonin reuptake inhibitors.  Past medical history includes anxiety.       Review of Systems  Constitutional: Positive for irritability. Negative for malaise/fatigue.  Respiratory: Positive for wheezing. Negative for cough and shortness of breath.   Musculoskeletal: Positive for arthritis and stiffness.  Psychiatric/Behavioral: Positive for depression. The patient is nervous/anxious.   All other systems reviewed and are negative.      Objective:   Physical Exam Vitals reviewed.  Constitutional:      General: He is not irritable.He is not in acute distress.    Appearance: He is well-developed.  HENT:     Head: Normocephalic.     Right Ear: Tympanic membrane normal.     Left Ear: Tympanic membrane normal.  Eyes:     General:        Right eye: No discharge.        Left eye: No discharge.     Pupils: Pupils are equal, round, and reactive to light.  Neck:     Thyroid: No thyromegaly.  Cardiovascular:     Rate and Rhythm: Normal rate and regular rhythm.     Heart sounds: Normal heart sounds. No murmur.  Pulmonary:  Effort: Pulmonary effort is normal. No respiratory distress.     Breath sounds: Normal breath sounds. No wheezing.  Abdominal:     General: Bowel sounds are normal. There is no distension.     Palpations: Abdomen is soft.     Tenderness: There is no abdominal tenderness.  Musculoskeletal:         General: No tenderness. Normal range of motion.     Cervical back: Normal range of motion and neck supple.  Skin:    General: Skin is warm and dry.     Findings: No erythema or rash.  Neurological:     Mental Status: He is alert and oriented to person, place, and time.     Cranial Nerves: No cranial nerve deficit.     Deep Tendon Reflexes: Reflexes are normal and symmetric.  Psychiatric:        Behavior: Behavior normal.        Thought Content: Thought content normal.        Judgment: Judgment normal.     BP (!) 165/102   Pulse 95   Temp 98.9 F (37.2 C) (Temporal)   Ht 5' 9" (1.753 m)   Wt 198 lb 6.4 oz (90 kg)   SpO2 95%   BMI 29.30 kg/m      Assessment & Plan:  Casey Klein comes in today with chief complaint of Medical Management of Chronic Issues (Not been taking amalodipine)   Diagnosis and orders addressed:  1. Arthritis - meloxicam (MOBIC) 15 MG tablet; Take 1 tablet (15 mg total) by mouth daily.  Dispense: 90 tablet; Refill: 2 - CMP14+EGFR - CBC with Differential/Platelet  2. Mild intermittent asthma without complication - montelukast (SINGULAIR) 10 MG tablet; Take 1 tablet (10 mg total) by mouth at bedtime.  Dispense: 90 tablet; Refill: 4 - budesonide-formoterol (SYMBICORT) 80-4.5 MCG/ACT inhaler; TAKE 2 PUFFS BY MOUTH TWICE A DAY  Dispense: 30.6 Inhaler; Refill: 0 - CMP14+EGFR - CBC with Differential/Platelet  3. Coronary artery disease involving native coronary artery of native heart without angina pectoris - atorvastatin (LIPITOR) 10 MG tablet; Take 1 tablet (10 mg total) by mouth daily. (Needs to be seen before next refill)  Dispense: 90 tablet; Refill: 2 - lisinopril (ZESTRIL) 10 MG tablet; Take 1 tablet (10 mg total) by mouth daily.  Dispense: 90 tablet; Refill: 2 - CMP14+EGFR - CBC with Differential/Platelet  4. Hyperlipidemia, unspecified hyperlipidemia type - atorvastatin (LIPITOR) 10 MG tablet; Take 1 tablet (10 mg total) by mouth daily.  (Needs to be seen before next refill)  Dispense: 90 tablet; Refill: 2 - CMP14+EGFR - CBC with Differential/Platelet - Lipid panel  5. Metabolic syndrome - atorvastatin (LIPITOR) 10 MG tablet; Take 1 tablet (10 mg total) by mouth daily. (Needs to be seen before next refill)  Dispense: 90 tablet; Refill: 2 - CMP14+EGFR - CBC with Differential/Platelet  6. Essential hypertension, benign Will restart Norvasc 10 mg today -Daily blood pressure log given with instructions on how to fill out and told to bring to next visit -Dash diet information given -Exercise encouraged - Stress Management  -Continue current meds -RTO in 2 weeks to recheck  - lisinopril (ZESTRIL) 10 MG tablet; Take 1 tablet (10 mg total) by mouth daily.  Dispense: 90 tablet; Refill: 2 - amLODipine (NORVASC) 10 MG tablet; Take 1 tablet (10 mg total) by mouth daily.  Dispense: 90 tablet; Refill: 2 - CMP14+EGFR - CBC with Differential/Platelet  7. Moderate episode of recurrent major depressive disorder (HCC) -  citalopram (CELEXA) 40 MG tablet; Take 1 tablet (40 mg total) by mouth daily. (Needs to be seen before next refill)  Dispense: 90 tablet; Refill: 2 - CMP14+EGFR - CBC with Differential/Platelet  8. GAD (generalized anxiety disorder) - citalopram (CELEXA) 40 MG tablet; Take 1 tablet (40 mg total) by mouth daily. (Needs to be seen before next refill)  Dispense: 90 tablet; Refill: 2 - CMP14+EGFR - CBC with Differential/Platelet  9. Frequent falls Will do labs today to make sure labs are normal today Long discussion about counting to 3 any time he stands, as most of his falls are occurring when standing. He has fallen on some ice, in woods, and while bending over. Discussed importance of fall preventions. - CMP14+EGFR - CBC with Differential/Platelet - TSH  10. Vitamin D deficiency  - Vitamin D, Ergocalciferol, (DRISDOL) 1.25 MG (50000 UNIT) CAPS capsule; Take 1 capsule (50,000 Units total) by mouth every 7 (seven)  days.  Dispense: 12 capsule; Refill: 3 - CMP14+EGFR - CBC with Differential/Platelet - VITAMIN D 25 Hydroxy (Vit-D Deficiency, Fractures)   Labs pending Health Maintenance reviewed Diet and exercise encouraged  Follow up plan: 2 weeks to recheck HTN and frequent falls   Evelina Dun, FNP

## 2020-02-21 LAB — CBC WITH DIFFERENTIAL/PLATELET
Basophils Absolute: 0.1 10*3/uL (ref 0.0–0.2)
Basos: 1 %
EOS (ABSOLUTE): 0.2 10*3/uL (ref 0.0–0.4)
Eos: 2 %
Hematocrit: 43.2 % (ref 37.5–51.0)
Hemoglobin: 14.6 g/dL (ref 13.0–17.7)
Immature Grans (Abs): 0.1 10*3/uL (ref 0.0–0.1)
Immature Granulocytes: 1 %
Lymphocytes Absolute: 2.3 10*3/uL (ref 0.7–3.1)
Lymphs: 21 %
MCH: 30.9 pg (ref 26.6–33.0)
MCHC: 33.8 g/dL (ref 31.5–35.7)
MCV: 92 fL (ref 79–97)
Monocytes Absolute: 1 10*3/uL — ABNORMAL HIGH (ref 0.1–0.9)
Monocytes: 9 %
Neutrophils Absolute: 7.3 10*3/uL — ABNORMAL HIGH (ref 1.4–7.0)
Neutrophils: 66 %
Platelets: 255 10*3/uL (ref 150–450)
RBC: 4.72 x10E6/uL (ref 4.14–5.80)
RDW: 16.9 % — ABNORMAL HIGH (ref 11.6–15.4)
WBC: 11 10*3/uL — ABNORMAL HIGH (ref 3.4–10.8)

## 2020-02-21 LAB — LIPID PANEL
Chol/HDL Ratio: 3.3 ratio (ref 0.0–5.0)
Cholesterol, Total: 141 mg/dL (ref 100–199)
HDL: 43 mg/dL (ref >39)
LDL Chol Calc (NIH): 67 mg/dL (ref 0–99)
Triglycerides: 185 mg/dL — ABNORMAL HIGH (ref 0–149)
VLDL Cholesterol Cal: 31 mg/dL (ref 5–40)

## 2020-02-21 LAB — TSH: TSH: 2.01 u[IU]/mL (ref 0.450–4.500)

## 2020-02-21 LAB — CMP14+EGFR
ALT: 11 IU/L (ref 0–44)
AST: 18 IU/L (ref 0–40)
Albumin/Globulin Ratio: 2.3 — ABNORMAL HIGH (ref 1.2–2.2)
Albumin: 4.9 g/dL — ABNORMAL HIGH (ref 3.7–4.7)
Alkaline Phosphatase: 55 IU/L (ref 39–117)
BUN/Creatinine Ratio: 10 (ref 10–24)
BUN: 12 mg/dL (ref 8–27)
Bilirubin Total: 0.9 mg/dL (ref 0.0–1.2)
CO2: 21 mmol/L (ref 20–29)
Calcium: 8.8 mg/dL (ref 8.6–10.2)
Chloride: 105 mmol/L (ref 96–106)
Creatinine, Ser: 1.16 mg/dL (ref 0.76–1.27)
GFR calc Af Amer: 68 mL/min/{1.73_m2} (ref >59)
GFR calc non Af Amer: 59 mL/min/{1.73_m2} — ABNORMAL LOW (ref >59)
Globulin, Total: 2.1 g/dL (ref 1.5–4.5)
Glucose: 83 mg/dL (ref 65–99)
Potassium: 4.2 mmol/L (ref 3.5–5.2)
Sodium: 144 mmol/L (ref 134–144)
Total Protein: 7 g/dL (ref 6.0–8.5)

## 2020-02-21 LAB — VITAMIN D 25 HYDROXY (VIT D DEFICIENCY, FRACTURES): Vit D, 25-Hydroxy: 36.3 ng/mL (ref 30.0–100.0)

## 2020-03-05 ENCOUNTER — Ambulatory Visit (INDEPENDENT_AMBULATORY_CARE_PROVIDER_SITE_OTHER): Payer: Medicare HMO | Admitting: Family

## 2020-03-05 ENCOUNTER — Encounter: Payer: Self-pay | Admitting: Family

## 2020-03-05 DIAGNOSIS — Z9181 History of falling: Secondary | ICD-10-CM | POA: Diagnosis not present

## 2020-03-05 DIAGNOSIS — I10 Essential (primary) hypertension: Secondary | ICD-10-CM | POA: Diagnosis not present

## 2020-03-05 NOTE — Progress Notes (Signed)
   Virtual Visit via telephone Note Due to COVID-19 pandemic this visit was conducted virtually. This visit type was conducted due to national recommendations for restrictions regarding the COVID-19 Pandemic (e.g. social distancing, sheltering in place) in an effort to limit this patient's exposure and mitigate transmission in our community. All issues noted in this document were discussed and addressed.  A physical exam was not performed with this format.  I connected with Casey Klein on 03/05/20 at 1:57 pm by telephone and verified that I am speaking with the correct person using two identifiers. Casey Klein is currently located at home and no one is currently with him during visit. The provider, Jannifer Rodney, FNP is located in their office at time of visit.  I discussed the limitations, risks, security and privacy concerns of performing an evaluation and management service by telephone and the availability of in person appointments. I also discussed with the patient that there may be a patient responsible charge related to this service. The patient expressed understanding and agreed to proceed.   History and Present Illness:  PT calls the office today recheck HTN and frequent falls. He reports he has not fallen since our last visit and he has tried being more careful and counting to 3 when he stands before he moves.  Hypertension This is a chronic problem. The current episode started more than 1 year ago. Progression since onset: unsure. Associated symptoms include malaise/fatigue. Pertinent negatives include no headaches, peripheral edema or shortness of breath. Past treatments include ACE inhibitors. The current treatment provides moderate improvement.      Review of Systems  Constitutional: Positive for malaise/fatigue.  Respiratory: Negative for shortness of breath.   Neurological: Negative for headaches.  All other systems reviewed and are  negative.    Observations/Objective: No SOB or distress noted  Assessment and Plan: 1. Essential hypertension, benign PT does not have a BP machine at home, he will come to office in two weeks to recheck -Dash diet information given -Exercise encouraged - Stress Management  -Continue current meds -RTO in 2 weeks   2. Risk for falls He reports he has not fallen since our last visit. Continue to be cautions and count when standing.      I discussed the assessment and treatment plan with the patient. The patient was provided an opportunity to ask questions and all were answered. The patient agreed with the plan and demonstrated an understanding of the instructions.   The patient was advised to call back or seek an in-person evaluation if the symptoms worsen or if the condition fails to improve as anticipated.  The above assessment and management plan was discussed with the patient. The patient verbalized understanding of and has agreed to the management plan. Patient is aware to call the clinic if symptoms persist or worsen. Patient is aware when to return to the clinic for a follow-up visit. Patient educated on when it is appropriate to go to the emergency department.   Time call ended:  2:10 pm   I provided 13 minutes of non-face-to-face time during this encounter.    Jannifer Rodney, FNP

## 2020-03-17 ENCOUNTER — Ambulatory Visit (INDEPENDENT_AMBULATORY_CARE_PROVIDER_SITE_OTHER): Payer: Medicare HMO | Admitting: Family

## 2020-03-17 ENCOUNTER — Encounter: Payer: Self-pay | Admitting: Family

## 2020-03-17 DIAGNOSIS — I10 Essential (primary) hypertension: Secondary | ICD-10-CM

## 2020-03-17 LAB — BMP8+EGFR
BUN/Creatinine Ratio: 13 (ref 10–24)
BUN: 15 mg/dL (ref 8–27)
CO2: 21 mmol/L (ref 20–29)
Calcium: 8.7 mg/dL (ref 8.6–10.2)
Chloride: 106 mmol/L (ref 96–106)
Creatinine, Ser: 1.17 mg/dL (ref 0.76–1.27)
GFR calc Af Amer: 68 mL/min/{1.73_m2} (ref >59)
GFR calc non Af Amer: 59 mL/min/{1.73_m2} — ABNORMAL LOW (ref >59)
Glucose: 174 mg/dL — ABNORMAL HIGH (ref 65–99)
Potassium: 3.7 mmol/L (ref 3.5–5.2)
Sodium: 142 mmol/L (ref 134–144)

## 2020-03-17 NOTE — Addendum Note (Signed)
Addended by: Prescott Gum on: 03/17/2020 10:26 AM   Modules accepted: Orders

## 2020-03-17 NOTE — Progress Notes (Signed)
   Virtual Visit via telephone Note Due to COVID-19 pandemic this visit was conducted virtually. This visit type was conducted due to national recommendations for restrictions regarding the COVID-19 Pandemic (e.g. social distancing, sheltering in place) in an effort to limit this patient's exposure and mitigate transmission in our community. All issues noted in this document were discussed and addressed.  A physical exam was not performed with this format.  I connected with Casey Klein on 03/17/20 at 9:24 AM by telephone and verified that I am speaking with the correct person using two identifiers. Casey Klein is currently located at car and no one is currently with him  during visit. The provider, Evelina Dun, FNP is located in their office at time of visit.  I discussed the limitations, risks, security and privacy concerns of performing an evaluation and management service by telephone and the availability of in person appointments. I also discussed with the patient that there may be a patient responsible charge related to this service. The patient expressed understanding and agreed to proceed.   History and Present Illness:  Pt calls the office today for follow up on HTN. Hypertension This is a chronic problem. The current episode started more than 1 year ago. The problem has been resolved since onset. The problem is controlled. Associated symptoms include malaise/fatigue and peripheral edema ("some"). Pertinent negatives include no shortness of breath. Risk factors for coronary artery disease include dyslipidemia, obesity, male gender and sedentary lifestyle. The current treatment provides moderate improvement.       Review of Systems  Constitutional: Positive for malaise/fatigue.  Respiratory: Negative for shortness of breath.      Observations/Objective: No SOB or distress noted   Assessment and Plan: 1. Essential hypertension, benign PT will come to office today and  have BP checked -Dash diet information given -Exercise encouraged - Stress Management  -Continue current meds RTO in 6 months - BMP8+EGFR; Future    I discussed the assessment and treatment plan with the patient. The patient was provided an opportunity to ask questions and all were answered. The patient agreed with the plan and demonstrated an understanding of the instructions.   The patient was advised to call back or seek an in-person evaluation if the symptoms worsen or if the condition fails to improve as anticipated.  The above assessment and management plan was discussed with the patient. The patient verbalized understanding of and has agreed to the management plan. Patient is aware to call the clinic if symptoms persist or worsen. Patient is aware when to return to the clinic for a follow-up visit. Patient educated on when it is appropriate to go to the emergency department.   Time call ended:  9: 33 AM  I provided 9 minutes of non-face-to-face time during this encounter.    Evelina Dun, FNP

## 2020-03-17 NOTE — Progress Notes (Signed)
Patients BP today was 123/58, no complaints voiced.

## 2020-03-18 ENCOUNTER — Ambulatory Visit (INDEPENDENT_AMBULATORY_CARE_PROVIDER_SITE_OTHER): Payer: Medicare HMO

## 2020-03-18 DIAGNOSIS — Z0000 Encounter for general adult medical examination without abnormal findings: Secondary | ICD-10-CM

## 2020-03-18 NOTE — Progress Notes (Signed)
MEDICARE ANNUAL WELLNESS VISIT  03/18/2020  Telephone Visit Disclaimer This Medicare AWV was conducted by telephone due to national recommendations for restrictions regarding the COVID-19 Pandemic (e.g. social distancing).  I verified, using two identifiers, that I am speaking with Casey Klein or their authorized healthcare agent. I discussed the limitations, risks, security, and privacy concerns of performing an evaluation and management service by telephone and the potential availability of an in-person appointment in the future. The patient expressed understanding and agreed to proceed.   Subjective:  Casey Klein is a 79 y.o. male patient of Hawks, Edilia Bo, FNP who had a Medicare Annual Wellness Visit today via telephone. Casey Klein is Retired and lives alone. he has three children. he reports that he is socially active and does interact with friends/family regularly. he is minimally physically active and enjoys fishing and hunting when he can.  Patient Care Team: Junie Spencer, FNP as PCP - General (Nurse Practitioner) Rollene Rotunda, MD as Consulting Physician (Cardiology)  Advanced Directives 03/18/2020 01/01/2019 01/11/2017 08/14/2015 06/18/2013 05/06/2013 04/30/2013  Does Patient Have a Medical Advance Directive? Yes No Yes No Patient would not like information;Patient does not have advance directive Patient does not have advance directive Patient would not like information;Patient does not have advance directive  Type of Advance Directive Living will - Healthcare Power of Attorney - - - -  Does patient want to make changes to medical advance directive? No - Patient declined - No - Patient declined - - - -  Copy of Healthcare Power of Attorney in Chart? - - No - copy requested - - - -  Would patient like information on creating a medical advance directive? No - Patient declined Yes (MAU/Ambulatory/Procedural Areas - Information given) - Yes - Educational materials given - - -    Pre-existing out of facility DNR order (yellow form or pink MOST form) - - - - No No No    Hospital Utilization Over the Past 12 Months: # of hospitalizations or ER visits: 0 # of surgeries: 0  Review of Systems    Patient reports that his overall health is unchanged compared to last year.  Pain Assessment Pain : No/denies pain    Current Medications & Allergies (verified) Allergies as of 03/18/2020      Reactions   Pneumococcal Vaccines    Pt states had pneumococcal vaccine at [facility] on 09-06-13 and arm swelled "huge with a lot swelling"    Penicillins Other (See Comments)   unknown      Medication List       Accurate as of March 18, 2020 10:27 AM. If you have any questions, ask your nurse or doctor.        amLODipine 10 MG tablet Commonly known as: NORVASC Take 1 tablet (10 mg total) by mouth daily.   aspirin EC 81 MG tablet Take 1 tablet (81 mg total) by mouth daily.   atorvastatin 10 MG tablet Commonly known as: LIPITOR Take 1 tablet (10 mg total) by mouth daily. (Needs to be seen before next refill)   budesonide-formoterol 80-4.5 MCG/ACT inhaler Commonly known as: Symbicort TAKE 2 PUFFS BY MOUTH TWICE A DAY   citalopram 40 MG tablet Commonly known as: CELEXA Take 1 tablet (40 mg total) by mouth daily. (Needs to be seen before next refill)   lisinopril 10 MG tablet Commonly known as: ZESTRIL Take 1 tablet (10 mg total) by mouth daily.   meloxicam 15 MG tablet Commonly known  as: MOBIC Take 1 tablet (15 mg total) by mouth daily.   montelukast 10 MG tablet Commonly known as: SINGULAIR Take 1 tablet (10 mg total) by mouth at bedtime.   vitamin B-12 500 MCG tablet Commonly known as: CYANOCOBALAMIN Take 500 mcg by mouth as needed.   Vitamin D (Ergocalciferol) 1.25 MG (50000 UNIT) Caps capsule Commonly known as: DRISDOL Take 1 capsule (50,000 Units total) by mouth every 7 (seven) days.       History (reviewed): Past Medical History:  Diagnosis  Date  . Allergy   . Asthma   . CAD (coronary artery disease)    Nonobstructive  . Carotid stenosis    50% bilateral 2012  . Cataract   . History of kidney stones   . HTN (hypertension)   . PAT (paroxysmal atrial tachycardia) ([place])    Past Surgical History:  Procedure Laterality Date  . BACK SURGERY     lumbar disc  . CATARACT EXTRACTION W/PHACO Right 05/06/2013   Procedure: CATARACT EXTRACTION PHACO AND INTRAOCULAR LENS PLACEMENT (IOC);  Surgeon: Williams Che, MD;  Location: [facility];  Service: Ophthalmology;  Laterality: Right;  CDE: 19.84  . CATARACT EXTRACTION W/PHACO Left 06/24/2013   Procedure: CATARACT EXTRACTION PHACO AND INTRAOCULAR LENS PLACEMENT (IOC);  Surgeon: Williams Che, MD;  Location: [facility];  Service: Ophthalmology;  Laterality: Left;  CDE:  18.21  . CERVICAL DISC SURGERY    . EYE SURGERY     History reviewed. No pertinent family history. Social History   Socioeconomic History  . Marital status: Widowed    Spouse name: Not on file  . Number of children: Not on file  . Years of education: Not on file  . Highest education level: Not on file  Occupational History  . Not on file  Tobacco Use  . Smoking status: Former Smoker    Packs/day: 1.00    Years: 10.00    Pack years: 10.00    Types: Cigarettes    Start date: 02/07/1983  . Smokeless tobacco: Never Used  Substance and Sexual Activity  . Alcohol use: No  . Drug use: No  . Sexual activity: Never    Birth control/protection: None  Other Topics Concern  . Not on file  Social History Narrative  . Not on file   Social Determinants of Health   Financial Resource Strain:   . Difficulty of Paying Living Expenses:   Food Insecurity:   . Worried About Charity fundraiser in the Last Year:   . Arboriculturist in the Last Year:   Transportation Needs:   . Film/video editor (Medical):   Marland Kitchen Lack of Transportation (Non-Medical):   Physical Activity:   . Days of Exercise per Week:   . Minutes of  Exercise per Session:   Stress:   . Feeling of Stress :   Social Connections:   . Frequency of Communication with Friends and Family:   . Frequency of Social Gatherings with Friends and Family:   . Attends Religious Services:   . Active Member of Clubs or Organizations:   . Attends Archivist Meetings:   Marland Kitchen Marital Status:     Activities of Daily Living In your present state of health, do you have any difficulty performing the following activities: 03/18/2020  Hearing? N  Vision? N  Difficulty concentrating or making decisions? N  Walking or climbing stairs? Y  Dressing or bathing? N  Doing errands, shopping? N  Preparing Food  and eating ? N  Using the Toilet? Y  Comment Delayed urine flow. No pain, burning, bleeding  In the past six months, have you accidently leaked urine? N  Do you have problems with loss of bowel control? N  Managing your Medications? N  Managing your Finances? N  Housekeeping or managing your Housekeeping? N  Some recent data might be hidden    Patient Education/ Literacy How often do you need to have someone help you when you read instructions, pamphlets, or other written materials from your doctor or pharmacy?: 2 - Rarely What is the last grade level you completed in school?: 7th grade  Exercise Current Exercise Habits: The patient does not participate in regular exercise at present, Exercise limited by: cardiac condition(s)  Diet Patient reports consuming 3 meals a day and 2 snack(s) a day Patient reports that his primary diet is: Regular Patient reports that she does have regular access to food.   Depression Screen PHQ 2/9 Scores 03/18/2020 02/20/2020 07/01/2019 01/01/2019 12/27/2018 06/26/2018 05/15/2018  PHQ - 2 Score 0 0 0 0 0 0 0  PHQ- 9 Score - - 0 - - - -     Fall Risk Fall Risk  03/18/2020 02/20/2020 01/01/2019 01/01/2019 12/27/2018  Falls in the past year? 1 1 0 0 0  Number falls in past yr: 1 1 0 - -  Injury with Fall? 1 1 0 - -    Comment fractured ribs - - - -  Risk Factor Category  - - - - -  Risk for fall due to : Impaired balance/gait - Impaired balance/gait - -  Follow up Falls evaluation completed - - - -     Objective:  Casey Klein seemed alert and oriented and he participated appropriately during our telephone visit.  Blood Pressure Weight BMI  BP Readings from Last 3 Encounters:  02/20/20 (!) 165/102  01/01/19 113/68  12/27/18 121/67   Wt Readings from Last 3 Encounters:  02/20/20 198 lb 6.4 oz (90 kg)  01/01/19 190 lb 12.8 oz (86.5 kg)  12/27/18 191 lb 6.4 oz (86.8 kg)   BMI Readings from Last 1 Encounters:  02/20/20 29.30 kg/m    *Unable to obtain current vital signs, weight, and BMI due to telephone visit type  Hearing/Vision  . Casey Klein did not seem to have difficulty with hearing/understanding during the telephone conversation . Reports that he has not had a formal eye exam by an eye care professional within the past year . Reports that he has not had a formal hearing evaluation within the past year *Unable to fully assess hearing and vision during telephone visit type  Cognitive Function: 6CIT Screen 03/18/2020  What Year? 0 points  What month? 0 points  What time? 0 points  Count back from 20 0 points  Months in reverse 2 points  Repeat phrase 2 points  Total Score 4   (Normal:0-7, Significant for Dysfunction: >8)  Normal Cognitive Function Screening: Yes   Immunization & Health Maintenance Record Immunization History  Administered Date(s) Administered  . Fluad Quad(high Dose 65+) 09/13/2019  . Influenza, High Dose Seasonal PF 09/06/2013, 08/30/2016, 09/11/2017, 11/05/2018  . Influenza,inj,Quad PF,6+ Mos 09/17/2014, 09/01/2015  . Pneumococcal-Unspecified 09/06/2013  . Zoster 05/17/2016    Health Maintenance  Topic Date Due  . PNA vac Low Risk Adult (2 of 2 - PCV13) 07/27/2027 (Originally 09/06/2014)  . INFLUENZA VACCINE  07/05/2020  . TETANUS/TDAP  01/26/2023        Assessment  This is a routine wellness examination for Casey Klein.  Health Maintenance: Due or Overdue There are no preventive care reminders to display for this patient.  Casey Klein does not need a referral for Community Assistance: Care Management:   no Social Work:    no Prescription Assistance:  no Nutrition/Diabetes Education:  no   Plan:  Personalized Goals  Exercise 3 times per week for 30 minutes  Have 3 meals per day  Personalized Health Maintenance & Screening Recommendations    Lung Cancer Screening Recommended: no (Low Dose CT Chest recommended if Age 71-80 years, 30 pack-year currently smoking OR have quit w/in past 15 years) Hepatitis C Screening recommended: no HIV Screening recommended: no  Advanced Directives: Written information was not prepared per patient's request.  Referrals & Orders No orders of the defined types were placed in this encounter.   Follow-up Plan . Follow-up with Junie Spencer, FNP as planned . Schedule 09/17/2020   I have personally reviewed and noted the following in the patient's chart:   . Medical and social history . Use of alcohol, tobacco or illicit drugs  . Current medications and supplements . Functional ability and status . Nutritional status . Physical activity . Advanced directives . List of other physicians . Hospitalizations, surgeries, and ER visits in previous 12 months . Vitals . Screenings to include cognitive, depression, and falls . Referrals and appointments  In addition, I have reviewed and discussed with Casey Klein certain preventive protocols, quality metrics, and best practice recommendations. A written personalized care plan for preventive services as well as general preventive health recommendations is available and can be mailed to the patient at his request.      Dorene Sorrow, LPN  7/34/2876

## 2020-03-18 NOTE — Patient Instructions (Addendum)
  MEDICARE ANNUAL WELLNESS VISIT Health Maintenance Summary and Written Plan of Care  Mr. Casey Klein ,  Thank you for allowing me to perform your Medicare Annual Wellness Visit and for your ongoing commitment to your health.   Health Maintenance & Immunization History Health Maintenance  Topic Date Due  . PNA vac Low Risk Adult (2 of 2 - PCV13) 07/27/2027 (Originally 09/06/2014)  . INFLUENZA VACCINE  07/05/2020  . TETANUS/TDAP  01/26/2023   Immunization History  Administered Date(s) Administered  . Fluad Quad(high Dose 65+) 09/13/2019  . Influenza, High Dose Seasonal PF 09/06/2013, 08/30/2016, 09/11/2017, 11/05/2018  . Influenza,inj,Quad PF,6+ Mos 09/17/2014, 09/01/2015  . Pneumococcal-Unspecified 09/06/2013  . Zoster 05/17/2016    These are the patient goals that we discussed:  Try and have three balanced meals per day.  Exercise as tolerated.  Fall Prevention. Use cane/ Walker when needed  This is a list of Health Maintenance Items that are overdue or due now: There are no preventive care reminders to display for this patient.   Orders/Referrals Placed Today: No orders of the defined types were placed in this encounter.  (Contact our referral department at [phone] if you have not spoken with someone about your referral appointment within the next 5 days)    Follow-up Plan  With Jannifer Rodney, FNP on 09/17/2020

## 2020-04-14 DIAGNOSIS — Z6829 Body mass index (BMI) 29.0-29.9, adult: Secondary | ICD-10-CM | POA: Diagnosis not present

## 2020-04-14 DIAGNOSIS — I10 Essential (primary) hypertension: Secondary | ICD-10-CM | POA: Diagnosis not present

## 2020-04-14 DIAGNOSIS — J449 Chronic obstructive pulmonary disease, unspecified: Secondary | ICD-10-CM | POA: Diagnosis not present

## 2020-04-14 DIAGNOSIS — E785 Hyperlipidemia, unspecified: Secondary | ICD-10-CM | POA: Diagnosis not present

## 2020-04-14 DIAGNOSIS — Z7951 Long term (current) use of inhaled steroids: Secondary | ICD-10-CM | POA: Diagnosis not present

## 2020-04-14 DIAGNOSIS — R69 Illness, unspecified: Secondary | ICD-10-CM | POA: Diagnosis not present

## 2020-04-14 DIAGNOSIS — G8929 Other chronic pain: Secondary | ICD-10-CM | POA: Diagnosis not present

## 2020-04-14 DIAGNOSIS — Z791 Long term (current) use of non-steroidal anti-inflammatories (NSAID): Secondary | ICD-10-CM | POA: Diagnosis not present

## 2020-04-14 DIAGNOSIS — E663 Overweight: Secondary | ICD-10-CM | POA: Diagnosis not present

## 2020-05-17 ENCOUNTER — Other Ambulatory Visit: Payer: Self-pay | Admitting: Family

## 2020-05-17 DIAGNOSIS — J4520 Mild intermittent asthma, uncomplicated: Secondary | ICD-10-CM

## 2020-09-14 ENCOUNTER — Encounter: Payer: Self-pay | Admitting: Family

## 2020-09-14 ENCOUNTER — Ambulatory Visit (INDEPENDENT_AMBULATORY_CARE_PROVIDER_SITE_OTHER): Payer: Medicare HMO | Admitting: Family

## 2020-09-14 VITALS — BP 140/86 | HR 81 | Temp 97.8°F | Ht 69.0 in | Wt 198.8 lb

## 2020-09-14 DIAGNOSIS — J4520 Mild intermittent asthma, uncomplicated: Secondary | ICD-10-CM

## 2020-09-14 DIAGNOSIS — E538 Deficiency of other specified B group vitamins: Secondary | ICD-10-CM | POA: Diagnosis not present

## 2020-09-14 DIAGNOSIS — I10 Essential (primary) hypertension: Secondary | ICD-10-CM | POA: Diagnosis not present

## 2020-09-14 DIAGNOSIS — M1990 Unspecified osteoarthritis, unspecified site: Secondary | ICD-10-CM

## 2020-09-14 DIAGNOSIS — Z23 Encounter for immunization: Secondary | ICD-10-CM

## 2020-09-14 DIAGNOSIS — E559 Vitamin D deficiency, unspecified: Secondary | ICD-10-CM | POA: Diagnosis not present

## 2020-09-14 DIAGNOSIS — R69 Illness, unspecified: Secondary | ICD-10-CM | POA: Diagnosis not present

## 2020-09-14 DIAGNOSIS — E663 Overweight: Secondary | ICD-10-CM | POA: Diagnosis not present

## 2020-09-14 DIAGNOSIS — F331 Major depressive disorder, recurrent, moderate: Secondary | ICD-10-CM

## 2020-09-14 DIAGNOSIS — E785 Hyperlipidemia, unspecified: Secondary | ICD-10-CM | POA: Diagnosis not present

## 2020-09-14 DIAGNOSIS — I2510 Atherosclerotic heart disease of native coronary artery without angina pectoris: Secondary | ICD-10-CM | POA: Diagnosis not present

## 2020-09-14 DIAGNOSIS — F411 Generalized anxiety disorder: Secondary | ICD-10-CM

## 2020-09-14 NOTE — Addendum Note (Signed)
Addended by: Ignacia Bayley on: 09/14/2020 09:22 AM   Modules accepted: Orders

## 2020-09-14 NOTE — Patient Instructions (Signed)
Health Maintenance After Age 81 After age 81, you are at a higher risk for certain long-term diseases and infections as well as injuries from falls. Falls are a major cause of broken bones and head injuries in people who are older than age 81. Getting regular preventive care can help to keep you healthy and well. Preventive care includes getting regular testing and making lifestyle changes as recommended by your health care provider. Talk with your health care provider about:  Which screenings and tests you should have. A screening is a test that checks for a disease when you have no symptoms.  A diet and exercise plan that is right for you. What should I know about screenings and tests to prevent falls? Screening and testing are the best ways to find a health problem early. Early diagnosis and treatment give you the best chance of managing medical conditions that are common after age 81. Certain conditions and lifestyle choices may make you more likely to have a fall. Your health care provider may recommend:  Regular vision checks. Poor vision and conditions such as cataracts can make you more likely to have a fall. If you wear glasses, make sure to get your prescription updated if your vision changes.  Medicine review. Work with your health care provider to regularly review all of the medicines you are taking, including over-the-counter medicines. Ask your health care provider about any side effects that may make you more likely to have a fall. Tell your health care provider if any medicines that you take make you feel dizzy or sleepy.  Osteoporosis screening. Osteoporosis is a condition that causes the bones to get weaker. This can make the bones weak and cause them to break more easily.  Blood pressure screening. Blood pressure changes and medicines to control blood pressure can make you feel dizzy.  Strength and balance checks. Your health care provider may recommend certain tests to check your  strength and balance while standing, walking, or changing positions.  Foot health exam. Foot pain and numbness, as well as not wearing proper footwear, can make you more likely to have a fall.  Depression screening. You may be more likely to have a fall if you have a fear of falling, feel emotionally low, or feel unable to do activities that you used to do.  Alcohol use screening. Using too much alcohol can affect your balance and may make you more likely to have a fall. What actions can I take to lower my risk of falls? General instructions  Talk with your health care provider about your risks for falling. Tell your health care provider if: ? You fall. Be sure to tell your health care provider about all falls, even ones that seem minor. ? You feel dizzy, sleepy, or off-balance.  Take over-the-counter and prescription medicines only as told by your health care provider. These include any supplements.  Eat a healthy diet and maintain a healthy weight. A healthy diet includes low-fat dairy products, low-fat (lean) meats, and fiber from whole grains, beans, and lots of fruits and vegetables. Home safety  Remove any tripping hazards, such as rugs, cords, and clutter.  Install safety equipment such as grab bars in bathrooms and safety rails on stairs.  Keep rooms and walkways well-lit. Activity   Follow a regular exercise program to stay fit. This will help you maintain your balance. Ask your health care provider what types of exercise are appropriate for you.  If you need a cane or   walker, use it as recommended by your health care provider.  Wear supportive shoes that have nonskid soles. Lifestyle  Do not drink alcohol if your health care provider tells you not to drink.  If you drink alcohol, limit how much you have: ? 0-1 drink a day for women. ? 0-2 drinks a day for men.  Be aware of how much alcohol is in your drink. In the [place] one drink equals one typical bottle of beer (12  oz), one-half glass of wine (5 oz), or one shot of hard liquor (1 oz).  Do not use any products that contain nicotine or tobacco, such as cigarettes and e-cigarettes. If you need help quitting, ask your health care provider. Summary  Having a healthy lifestyle and getting preventive care can help to protect your health and wellness after age 81.  Screening and testing are the best way to find a health problem early and help you avoid having a fall. Early diagnosis and treatment give you the best chance for managing medical conditions that are more common for people who are older than age 81.  Falls are a major cause of broken bones and head injuries in people who are older than age 81. Take precautions to prevent a fall at home.  Work with your health care provider to learn what changes you can make to improve your health and wellness and to prevent falls. This information is not intended to replace advice given to you by your health care provider. Make sure you discuss any questions you have with your health care provider. Document Revised: 03/14/2019 Document Reviewed: 10/04/2017 Elsevier Patient Education  2020 Elsevier Inc.  

## 2020-09-14 NOTE — Progress Notes (Signed)
Subjective:    Patient ID: Casey Klein, male    DOB: 08-11-1941, 79 y.o.   MRN: [id number]  Chief Complaint  Patient presents with  . Hypertension    6 mth follow up, wants flu shot today   . Medical Management of Chronic Issues   Pt presents to the office today for chronic follow up. Hypertension This is a new problem. The current episode started more than 1 year ago. The problem has been resolved since onset. The problem is controlled. Associated symptoms include anxiety, malaise/fatigue and shortness of breath. Pertinent negatives include no peripheral edema. Risk factors for coronary artery disease include dyslipidemia, diabetes mellitus, male gender and sedentary lifestyle. The current treatment provides moderate improvement. Hypertensive end-organ damage includes CAD/MI. There is no history of CVA or heart failure.  Asthma He complains of shortness of breath and wheezing. There is no cough. This is a chronic problem. The current episode started more than 1 year ago. The problem occurs intermittently. The problem has been waxing and waning. Associated symptoms include malaise/fatigue. His symptoms are aggravated by lying down and pollen. He reports moderate improvement on treatment. His past medical history is significant for asthma.  Arthritis Presents for follow-up visit. He complains of pain and stiffness. The symptoms have been stable. Affected locations include the right knee, left knee, left MCP and right MCP. His pain is at a severity of 8/10.  Hyperlipidemia This is a chronic problem. The current episode started more than 1 year ago. Associated symptoms include shortness of breath. Current antihyperlipidemic treatment includes statins. The current treatment provides moderate improvement of lipids. Risk factors for coronary artery disease include dyslipidemia, hypertension and a sedentary lifestyle.  Anxiety Presents for follow-up visit. Symptoms include depressed mood,  excessive worry, nervous/anxious behavior, restlessness and shortness of breath. Symptoms occur most days. The severity of symptoms is moderate. The quality of sleep is good.   His past medical history is significant for asthma.  Depression        This is a chronic problem.  The current episode started more than 1 year ago.   The problem occurs intermittently.  Associated symptoms include irritable, restlessness and sad.  Associated symptoms include no helplessness and no hopelessness.  Past treatments include SSRIs - Selective serotonin reuptake inhibitors.  Compliance with treatment is good.  Past medical history includes anxiety.       Review of Systems  Constitutional: Positive for malaise/fatigue.  Respiratory: Positive for shortness of breath and wheezing. Negative for cough.   Musculoskeletal: Positive for arthritis and stiffness.  Psychiatric/Behavioral: Positive for depression. The patient is nervous/anxious.   All other systems reviewed and are negative.      Objective:   Physical Exam Vitals reviewed.  Constitutional:      General: He is irritable. He is not in acute distress.    Appearance: He is well-developed.  HENT:     Head: Normocephalic.     Right Ear: Tympanic membrane normal.     Left Ear: Tympanic membrane normal.  Eyes:     General:        Right eye: No discharge.        Left eye: No discharge.     Pupils: Pupils are equal, round, and reactive to light.  Neck:     Thyroid: No thyromegaly.  Cardiovascular:     Rate and Rhythm: Normal rate and regular rhythm.     Heart sounds: Murmur heard.   Pulmonary:  Effort: Pulmonary effort is normal. No respiratory distress.     Breath sounds: Normal breath sounds. No wheezing.  Abdominal:     General: Bowel sounds are normal. There is no distension.     Palpations: Abdomen is soft.     Tenderness: There is no abdominal tenderness.  Musculoskeletal:        General: No tenderness. Normal range of motion.      Cervical back: Normal range of motion and neck supple.  Skin:    General: Skin is warm and dry.     Findings: No erythema or rash.  Neurological:     Mental Status: He is alert and oriented to person, place, and time.     Cranial Nerves: No cranial nerve deficit.     Deep Tendon Reflexes: Reflexes are normal and symmetric.  Psychiatric:        Behavior: Behavior normal.        Thought Content: Thought content normal.        Judgment: Judgment normal.      BP 140/86   Pulse 81   Temp 97.8 F (36.6 C)   Ht _0  (1.753 m)   Wt 198 lb 12.8 oz (90.2 kg)   SpO2 95%   BMI 29.36 kg/m       Assessment & Plan:  Casey Klein comes in today with chief complaint of Hypertension (6 mth follow up, wants flu shot today ) and Medical Management of Chronic Issues   Diagnosis and orders addressed:  1. Coronary artery disease involving native coronary artery of native heart without angina pectoris - CMP14+EGFR - CBC with Differential/Platelet  2. Essential hypertension, benign - CMP14+EGFR - CBC with Differential/Platelet  3. Mild intermittent asthma without complication - ZOX09+UEAV - CBC with Differential/Platelet  4. Arthritis - CMP14+EGFR - CBC with Differential/Platelet  5. Moderate episode of recurrent major depressive disorder (HCC) - CMP14+EGFR - CBC with Differential/Platelet  6. GAD (generalized anxiety disorder) - CMP14+EGFR - CBC with Differential/Platelet  7. Hyperlipidemia, unspecified hyperlipidemia type - CMP14+EGFR - CBC with Differential/Platelet  8. Overweight (BMI 25.0-29.9) - CMP14+EGFR - CBC with Differential/Platelet  9. Vitamin D deficiency - CMP14+EGFR - CBC with Differential/Platelet  10. Vitamin B 12 deficiency - CMP14+EGFR - CBC with Differential/Platelet - Vitamin B12  11. Need for immunization against influenza - Flu Vaccine QUAD High Dose(Fluad) - CMP14+EGFR - CBC with Differential/Platelet   Labs pending Health  Maintenance reviewed Diet and exercise encouraged  Follow up plan: 4 months   Evelina Dun, FNP

## 2020-09-15 LAB — CMP14+EGFR
ALT: 19 IU/L (ref 0–44)
AST: 20 IU/L (ref 0–40)
Albumin/Globulin Ratio: 2.1 (ref 1.2–2.2)
Albumin: 4.7 g/dL (ref 3.7–4.7)
Alkaline Phosphatase: 50 IU/L (ref 44–121)
BUN/Creatinine Ratio: 15 (ref 10–24)
BUN: 15 mg/dL (ref 8–27)
Bilirubin Total: 1 mg/dL (ref 0.0–1.2)
CO2: 24 mmol/L (ref 20–29)
Calcium: 8.8 mg/dL (ref 8.6–10.2)
Chloride: 109 mmol/L — ABNORMAL HIGH (ref 96–106)
Creatinine, Ser: 1.03 mg/dL (ref 0.76–1.27)
GFR calc Af Amer: 79 mL/min/{1.73_m2} (ref >59)
GFR calc non Af Amer: 68 mL/min/{1.73_m2} (ref >59)
Globulin, Total: 2.2 g/dL (ref 1.5–4.5)
Glucose: 96 mg/dL (ref 65–99)
Potassium: 3.9 mmol/L (ref 3.5–5.2)
Sodium: 145 mmol/L — ABNORMAL HIGH (ref 134–144)
Total Protein: 6.9 g/dL (ref 6.0–8.5)

## 2020-09-15 LAB — CBC WITH DIFFERENTIAL/PLATELET
Basophils Absolute: 0.1 10*3/uL (ref 0.0–0.2)
Basos: 1 %
EOS (ABSOLUTE): 0.2 10*3/uL (ref 0.0–0.4)
Eos: 2 %
Hematocrit: 40.5 % (ref 37.5–51.0)
Hemoglobin: 13.7 g/dL (ref 13.0–17.7)
Immature Grans (Abs): 0.1 10*3/uL (ref 0.0–0.1)
Immature Granulocytes: 1 %
Lymphocytes Absolute: 1.6 10*3/uL (ref 0.7–3.1)
Lymphs: 18 %
MCH: 31.6 pg (ref 26.6–33.0)
MCHC: 33.8 g/dL (ref 31.5–35.7)
MCV: 94 fL (ref 79–97)
Monocytes Absolute: 0.7 10*3/uL (ref 0.1–0.9)
Monocytes: 8 %
Neutrophils Absolute: 6.3 10*3/uL (ref 1.4–7.0)
Neutrophils: 70 %
Platelets: 267 10*3/uL (ref 150–450)
RBC: 4.33 x10E6/uL (ref 4.14–5.80)
RDW: 17.8 % — ABNORMAL HIGH (ref 11.6–15.4)
WBC: 9 10*3/uL (ref 3.4–10.8)

## 2020-09-15 LAB — VITAMIN B12: Vitamin B-12: 281 pg/mL (ref 232–1245)

## 2020-09-17 ENCOUNTER — Ambulatory Visit: Payer: Medicare HMO | Admitting: Family

## 2020-11-13 ENCOUNTER — Other Ambulatory Visit: Payer: Self-pay | Admitting: Family

## 2020-11-13 DIAGNOSIS — E8881 Metabolic syndrome: Secondary | ICD-10-CM

## 2020-11-13 DIAGNOSIS — E785 Hyperlipidemia, unspecified: Secondary | ICD-10-CM

## 2020-11-13 DIAGNOSIS — I10 Essential (primary) hypertension: Secondary | ICD-10-CM

## 2020-11-13 DIAGNOSIS — I2510 Atherosclerotic heart disease of native coronary artery without angina pectoris: Secondary | ICD-10-CM

## 2020-12-02 ENCOUNTER — Other Ambulatory Visit: Payer: Self-pay | Admitting: Family

## 2020-12-02 DIAGNOSIS — M1990 Unspecified osteoarthritis, unspecified site: Secondary | ICD-10-CM

## 2020-12-03 ENCOUNTER — Other Ambulatory Visit: Payer: Self-pay | Admitting: Family

## 2020-12-03 DIAGNOSIS — F331 Major depressive disorder, recurrent, moderate: Secondary | ICD-10-CM

## 2020-12-03 DIAGNOSIS — F411 Generalized anxiety disorder: Secondary | ICD-10-CM

## 2021-01-06 ENCOUNTER — Other Ambulatory Visit: Payer: Self-pay | Admitting: Family

## 2021-01-06 DIAGNOSIS — I2510 Atherosclerotic heart disease of native coronary artery without angina pectoris: Secondary | ICD-10-CM

## 2021-01-06 DIAGNOSIS — I10 Essential (primary) hypertension: Secondary | ICD-10-CM

## 2021-01-14 ENCOUNTER — Encounter: Payer: Self-pay | Admitting: Family

## 2021-01-14 ENCOUNTER — Other Ambulatory Visit: Payer: Self-pay

## 2021-01-14 ENCOUNTER — Ambulatory Visit (INDEPENDENT_AMBULATORY_CARE_PROVIDER_SITE_OTHER): Payer: Medicare Other | Admitting: Family

## 2021-01-14 VITALS — BP 137/68 | HR 89 | Temp 97.0°F | Ht 69.0 in | Wt 203.0 lb

## 2021-01-14 DIAGNOSIS — M1990 Unspecified osteoarthritis, unspecified site: Secondary | ICD-10-CM

## 2021-01-14 DIAGNOSIS — F331 Major depressive disorder, recurrent, moderate: Secondary | ICD-10-CM

## 2021-01-14 DIAGNOSIS — I10 Essential (primary) hypertension: Secondary | ICD-10-CM | POA: Diagnosis not present

## 2021-01-14 DIAGNOSIS — E663 Overweight: Secondary | ICD-10-CM

## 2021-01-14 DIAGNOSIS — E785 Hyperlipidemia, unspecified: Secondary | ICD-10-CM

## 2021-01-14 DIAGNOSIS — I2510 Atherosclerotic heart disease of native coronary artery without angina pectoris: Secondary | ICD-10-CM | POA: Diagnosis not present

## 2021-01-14 DIAGNOSIS — J4520 Mild intermittent asthma, uncomplicated: Secondary | ICD-10-CM

## 2021-01-14 DIAGNOSIS — E538 Deficiency of other specified B group vitamins: Secondary | ICD-10-CM

## 2021-01-14 DIAGNOSIS — E559 Vitamin D deficiency, unspecified: Secondary | ICD-10-CM

## 2021-01-14 DIAGNOSIS — F411 Generalized anxiety disorder: Secondary | ICD-10-CM

## 2021-01-14 NOTE — Patient Instructions (Signed)
Health Maintenance After Age 82 After age 82, you are at a higher risk for certain long-term diseases and infections as well as injuries from falls. Falls are a major cause of broken bones and head injuries in people who are older than age 82. Getting regular preventive care can help to keep you healthy and well. Preventive care includes getting regular testing and making lifestyle changes as recommended by your health care provider. Talk with your health care provider about:  Which screenings and tests you should have. A screening is a test that checks for a disease when you have no symptoms.  A diet and exercise plan that is right for you. What should I know about screenings and tests to prevent falls? Screening and testing are the best ways to find a health problem early. Early diagnosis and treatment give you the best chance of managing medical conditions that are common after age 82. Certain conditions and lifestyle choices may make you more likely to have a fall. Your health care provider may recommend:  Regular vision checks. Poor vision and conditions such as cataracts can make you more likely to have a fall. If you wear glasses, make sure to get your prescription updated if your vision changes.  Medicine review. Work with your health care provider to regularly review all of the medicines you are taking, including over-the-counter medicines. Ask your health care provider about any side effects that may make you more likely to have a fall. Tell your health care provider if any medicines that you take make you feel dizzy or sleepy.  Osteoporosis screening. Osteoporosis is a condition that causes the bones to get weaker. This can make the bones weak and cause them to break more easily.  Blood pressure screening. Blood pressure changes and medicines to control blood pressure can make you feel dizzy.  Strength and balance checks. Your health care provider may recommend certain tests to check your  strength and balance while standing, walking, or changing positions.  Foot health exam. Foot pain and numbness, as well as not wearing proper footwear, can make you more likely to have a fall.  Depression screening. You may be more likely to have a fall if you have a fear of falling, feel emotionally low, or feel unable to do activities that you used to do.  Alcohol use screening. Using too much alcohol can affect your balance and may make you more likely to have a fall. What actions can I take to lower my risk of falls? General instructions  Talk with your health care provider about your risks for falling. Tell your health care provider if: ? You fall. Be sure to tell your health care provider about all falls, even ones that seem minor. ? You feel dizzy, sleepy, or off-balance.  Take over-the-counter and prescription medicines only as told by your health care provider. These include any supplements.  Eat a healthy diet and maintain a healthy weight. A healthy diet includes low-fat dairy products, low-fat (lean) meats, and fiber from whole grains, beans, and lots of fruits and vegetables. Home safety  Remove any tripping hazards, such as rugs, cords, and clutter.  Install safety equipment such as grab bars in bathrooms and safety rails on stairs.  Keep rooms and walkways well-lit. Activity  Follow a regular exercise program to stay fit. This will help you maintain your balance. Ask your health care provider what types of exercise are appropriate for you.  If you need a cane or walker,   use it as recommended by your health care provider.  Wear supportive shoes that have nonskid soles.   Lifestyle  Do not drink alcohol if your health care provider tells you not to drink.  If you drink alcohol, limit how much you have: ? 0-1 drink a day for women. ? 0-2 drinks a day for men.  Be aware of how much alcohol is in your drink. In the [place] one drink equals one typical bottle of beer (12  oz), one-half glass of wine (5 oz), or one shot of hard liquor (1 oz).  Do not use any products that contain nicotine or tobacco, such as cigarettes and e-cigarettes. If you need help quitting, ask your health care provider. Summary  Having a healthy lifestyle and getting preventive care can help to protect your health and wellness after age 82.  Screening and testing are the best way to find a health problem early and help you avoid having a fall. Early diagnosis and treatment give you the best chance for managing medical conditions that are more common for people who are older than age 82.  Falls are a major cause of broken bones and head injuries in people who are older than age 82. Take precautions to prevent a fall at home.  Work with your health care provider to learn what changes you can make to improve your health and wellness and to prevent falls. This information is not intended to replace advice given to you by your health care provider. Make sure you discuss any questions you have with your health care provider. Document Revised: 03/14/2019 Document Reviewed: 10/04/2017 Elsevier Patient Education  2021 Elsevier Inc.  

## 2021-01-14 NOTE — Progress Notes (Signed)
Subjective:    Patient ID: Casey Klein, male    DOB: [birth year], 80 y.o.   MRN: [id number]  Chief Complaint  Patient presents with  . Hypertension   Pt presents to the office today for chronic follow up. Hypertension This is a chronic problem. The current episode started more than 1 year ago. The problem has been resolved since onset. The problem is controlled. Associated symptoms include anxiety, malaise/fatigue and peripheral edema (In left foot at time). Pertinent negatives include no shortness of breath. Risk factors for coronary artery disease include dyslipidemia, obesity and sedentary lifestyle. The current treatment provides moderate improvement.  Asthma He complains of cough and wheezing. There is no shortness of breath. This is a chronic problem. The current episode started more than 1 year ago. The problem occurs intermittently. The cough is productive of sputum. Associated symptoms include malaise/fatigue. Pertinent negatives include no fever. His symptoms are alleviated by leukotriene antagonist. He reports moderate improvement on treatment. His past medical history is significant for asthma.  Arthritis Presents for follow-up visit. He complains of pain and stiffness. The symptoms have been stable. Affected locations include the left knee, right knee, left MCP, right MCP, left shoulder and right shoulder. His pain is at a severity of 5/10. Pertinent negatives include no fever.  Anemia Presents for follow-up visit. Symptoms include malaise/fatigue. There has been no fever.  Hyperlipidemia This is a chronic problem. The current episode started more than 1 year ago. The problem is controlled. Recent lipid tests were reviewed and are normal. Pertinent negatives include no shortness of breath. Current antihyperlipidemic treatment includes statins. The current treatment provides moderate improvement of lipids. Risk factors for coronary artery disease include hypertension, a sedentary  lifestyle and dyslipidemia.  Anxiety Presents for follow-up visit. Symptoms include depressed mood, irritability, nervous/anxious behavior and restlessness. Patient reports no shortness of breath. Symptoms occur most days. The severity of symptoms is moderate. The quality of sleep is good.   His past medical history is significant for anemia and asthma.  Depression        This is a chronic problem.  The current episode started more than 1 year ago.   The onset quality is gradual.   The problem occurs intermittently.  Associated symptoms include irritable, restlessness and sad.  Associated symptoms include no helplessness and no hopelessness.  Past treatments include nothing.  Past medical history includes anxiety.       Review of Systems  Constitutional: Positive for irritability and malaise/fatigue. Negative for fever.  Respiratory: Positive for cough and wheezing. Negative for shortness of breath.   Musculoskeletal: Positive for arthritis and stiffness.  Psychiatric/Behavioral: Positive for depression. The patient is nervous/anxious.   All other systems reviewed and are negative.      Objective:   Physical Exam Vitals reviewed.  Constitutional:      General: He is irritable. He is not in acute distress.    Appearance: He is well-developed and well-nourished.  HENT:     Head: Normocephalic.     Right Ear: Tympanic membrane normal.     Left Ear: Tympanic membrane normal.     Mouth/Throat:     Mouth: Oropharynx is clear and moist.  Eyes:     General:        Right eye: No discharge.        Left eye: No discharge.     Pupils: Pupils are equal, round, and reactive to light.  Neck:     Thyroid: No thyromegaly.  Cardiovascular:     Rate and Rhythm: Normal rate and regular rhythm.     Pulses: Intact distal pulses.     Heart sounds: Normal heart sounds. No murmur heard.   Pulmonary:     Effort: Pulmonary effort is normal. No respiratory distress.     Breath sounds: Normal  breath sounds. No wheezing.  Abdominal:     General: Bowel sounds are normal. There is no distension.     Palpations: Abdomen is soft.     Tenderness: There is no abdominal tenderness.  Musculoskeletal:        General: No tenderness or edema. Normal range of motion.     Cervical back: Normal range of motion and neck supple.  Skin:    General: Skin is warm and dry.     Findings: No erythema or rash.  Neurological:     Mental Status: He is alert and oriented to person, place, and time.     Cranial Nerves: No cranial nerve deficit.     Deep Tendon Reflexes: Reflexes are normal and symmetric.  Psychiatric:        Mood and Affect: Mood and affect normal.        Behavior: Behavior normal.        Thought Content: Thought content normal.        Judgment: Judgment normal.       BP 137/68   Pulse 89   Temp (!) 97 F (36.1 C) (Temporal)   Ht 5' 9"  (1.753 m)   Wt 203 lb (92.1 kg)   SpO2 95%   BMI 29.98 kg/m      Assessment & Plan:  Casey Klein comes in today with chief complaint of Hypertension   Diagnosis and orders addressed:  1. Essential hypertension, benign - CMP14+EGFR - CBC with Differential/Platelet  2. Coronary artery disease involving native coronary artery of native heart without angina pectoris - CMP14+EGFR - CBC with Differential/Platelet  3. Mild intermittent asthma without complication - VHQ46+NGEX - CBC with Differential/Platelet  4. Arthritis - CMP14+EGFR - CBC with Differential/Platelet  5. Vitamin B 12 deficiency - CMP14+EGFR - CBC with Differential/Platelet  6. Vitamin D deficiency - CMP14+EGFR - CBC with Differential/Platelet  7. Overweight (BMI 25.0-29.9) - CMP14+EGFR - CBC with Differential/Platelet  8. Hyperlipidemia, unspecified hyperlipidemia type - CMP14+EGFR - CBC with Differential/Platelet  9. Moderate episode of recurrent major depressive disorder (HCC) - CMP14+EGFR - CBC with Differential/Platelet  10. GAD  (generalized anxiety disorder) - CMP14+EGFR - CBC with Differential/Platelet   Labs pending Health Maintenance reviewed Diet and exercise encouraged  Follow up plan: 4 months    Evelina Dun, FNP

## 2021-01-15 LAB — CBC WITH DIFFERENTIAL/PLATELET
Basophils Absolute: 0.1 10*3/uL (ref 0.0–0.2)
Basos: 1 %
EOS (ABSOLUTE): 0.2 10*3/uL (ref 0.0–0.4)
Eos: 2 %
Hematocrit: 40.1 % (ref 37.5–51.0)
Hemoglobin: 13.5 g/dL (ref 13.0–17.7)
Immature Grans (Abs): 0 10*3/uL (ref 0.0–0.1)
Immature Granulocytes: 0 %
Lymphocytes Absolute: 1.9 10*3/uL (ref 0.7–3.1)
Lymphs: 19 %
MCH: 31 pg (ref 26.6–33.0)
MCHC: 33.7 g/dL (ref 31.5–35.7)
MCV: 92 fL (ref 79–97)
Monocytes Absolute: 0.9 10*3/uL (ref 0.1–0.9)
Monocytes: 9 %
Neutrophils Absolute: 7 10*3/uL (ref 1.4–7.0)
Neutrophils: 69 %
Platelets: 262 10*3/uL (ref 150–450)
RBC: 4.36 x10E6/uL (ref 4.14–5.80)
RDW: 16.7 % — ABNORMAL HIGH (ref 11.6–15.4)
WBC: 10 10*3/uL (ref 3.4–10.8)

## 2021-01-15 LAB — CMP14+EGFR
ALT: 16 IU/L (ref 0–44)
AST: 16 IU/L (ref 0–40)
Albumin/Globulin Ratio: 1.8 (ref 1.2–2.2)
Albumin: 4.4 g/dL (ref 3.6–4.6)
Alkaline Phosphatase: 55 IU/L (ref 44–121)
BUN/Creatinine Ratio: 15 (ref 10–24)
BUN: 16 mg/dL (ref 8–27)
Bilirubin Total: 0.6 mg/dL (ref 0.0–1.2)
CO2: 20 mmol/L (ref 20–29)
Calcium: 9.1 mg/dL (ref 8.6–10.2)
Chloride: 104 mmol/L (ref 96–106)
Creatinine, Ser: 1.05 mg/dL (ref 0.76–1.27)
GFR calc Af Amer: 77 mL/min/{1.73_m2} (ref >59)
GFR calc non Af Amer: 66 mL/min/{1.73_m2} (ref >59)
Globulin, Total: 2.4 g/dL (ref 1.5–4.5)
Glucose: 101 mg/dL — ABNORMAL HIGH (ref 65–99)
Potassium: 4.1 mmol/L (ref 3.5–5.2)
Sodium: 142 mmol/L (ref 134–144)
Total Protein: 6.8 g/dL (ref 6.0–8.5)

## 2021-01-26 ENCOUNTER — Encounter: Payer: Self-pay | Admitting: Nurse Practitioner

## 2021-01-26 ENCOUNTER — Ambulatory Visit (INDEPENDENT_AMBULATORY_CARE_PROVIDER_SITE_OTHER): Payer: Medicare Other | Admitting: Nurse Practitioner

## 2021-01-26 VITALS — BP 148/92 | HR 94 | Temp 97.3°F

## 2021-01-26 DIAGNOSIS — R059 Cough, unspecified: Secondary | ICD-10-CM | POA: Diagnosis not present

## 2021-01-26 MED ORDER — DOXYCYCLINE HYCLATE 100 MG PO TABS: 100.0000 mg | ORAL_TABLET | Freq: Two times a day (BID) | ORAL | 0 refills | Status: DC

## 2021-01-26 NOTE — Assessment & Plan Note (Signed)
New cough symptoms not well controlled.  Completed COVID-19 swab results pending.  Started patient on guaifenesin.  And Augmentin.  Education provided to patient, continue to monitor oxygen saturation, monitor any signs and symptoms of fever,.  Rx sent to pharmacy.  Follow-up with worsening symptoms

## 2021-01-26 NOTE — Patient Instructions (Signed)

## 2021-01-26 NOTE — Progress Notes (Signed)
Acute Office Visit  Subjective:    Patient ID: Casey Klein, male    DOB: 12-28-1940, 80 y.o.   MRN: [id number]  Chief Complaint  Patient presents with  . Headache  . Nasal Congestion  . Cough    Cough This is a new problem. The current episode started in the past 7 days. The problem has been unchanged. The cough is non-productive. Associated symptoms include nasal congestion. Pertinent negatives include no chest pain, chills, ear congestion or sore throat. Nothing aggravates the symptoms. He has tried nothing for the symptoms. His past medical history is significant for asthma.     Past Medical History:  Diagnosis Date  . Allergy   . Asthma   . CAD (coronary artery disease)    Nonobstructive  . Carotid stenosis    50% bilateral 2012  . Cataract   . History of kidney stones   . HTN (hypertension)   . PAT (paroxysmal atrial tachycardia) (HCC)     Past Surgical History:  Procedure Laterality Date  . BACK SURGERY     lumbar disc  . CATARACT EXTRACTION W/PHACO Right 05/06/2013   Procedure: CATARACT EXTRACTION PHACO AND INTRAOCULAR LENS PLACEMENT (IOC);  Surgeon: Susa Simmonds, MD;  Location: [facility];  Service: Ophthalmology;  Laterality: Right;  CDE: 19.84  . CATARACT EXTRACTION W/PHACO Left 06/24/2013   Procedure: CATARACT EXTRACTION PHACO AND INTRAOCULAR LENS PLACEMENT (IOC);  Surgeon: Susa Simmonds, MD;  Location: [facility];  Service: Ophthalmology;  Laterality: Left;  CDE:  18.21  . CERVICAL DISC SURGERY    . EYE SURGERY      History reviewed. No pertinent family history.  Social History   Socioeconomic History  . Marital status: Widowed    Spouse name: Not on file  . Number of children: Not on file  . Years of education: Not on file  . Highest education level: Not on file  Occupational History  . Not on file  Tobacco Use  . Smoking status: Former Smoker    Packs/day: 1.00    Years: 10.00    Pack years: 10.00    Types: Cigarettes    Start date:  02/07/1983  . Smokeless tobacco: Never Used  Vaping Use  . Vaping Use: Never used  Substance and Sexual Activity  . Alcohol use: No  . Drug use: No  . Sexual activity: Never    Birth control/protection: None  Other Topics Concern  . Not on file  Social History Narrative  . Not on file   Social Determinants of Health   Financial Resource Strain: Not on file  Food Insecurity: Not on file  Transportation Needs: Not on file  Physical Activity: Not on file  Stress: Not on file  Social Connections: Not on file  Intimate Partner Violence: Not on file    Outpatient Medications Prior to Visit  Medication Sig Dispense Refill  . amLODipine (NORVASC) 10 MG tablet TAKE 1 TABLET BY MOUTH EVERY DAY 90 tablet 0  . aspirin EC 81 MG tablet Take 1 tablet (81 mg total) by mouth daily. 90 tablet 1  . atorvastatin (LIPITOR) 10 MG tablet Take 1 tablet (10 mg total) by mouth daily. 90 tablet 0  . budesonide-formoterol (SYMBICORT) 80-4.5 MCG/ACT inhaler TAKE 2 PUFFS BY MOUTH TWICE A DAY 30.6 Inhaler 0  . citalopram (CELEXA) 40 MG tablet TAKE 1 TABLET (40 MG TOTAL) BY MOUTH DAILY. (NEEDS TO BE SEEN BEFORE NEXT REFILL) 90 tablet 0  . lisinopril (ZESTRIL) 10  MG tablet TAKE 1 TABLET BY MOUTH EVERY DAY 90 tablet 0  . meloxicam (MOBIC) 15 MG tablet TAKE 1 TABLET BY MOUTH EVERY DAY 90 tablet 2  . montelukast (SINGULAIR) 10 MG tablet Take 10 mg by mouth daily.    . Vitamin D, Ergocalciferol, (DRISDOL) 1.25 MG (50000 UNIT) CAPS capsule Take 1 capsule (50,000 Units total) by mouth every 7 (seven) days. 12 capsule 3   No facility-administered medications prior to visit.    Allergies  Allergen Reactions  . Pneumococcal Vaccines     Pt states had pneumococcal vaccine at [facility] on 09-06-13 and arm swelled "huge with a lot swelling"   . Penicillins Other (See Comments)    unknown    Review of Systems  Constitutional: Negative for chills.  HENT: Negative for sinus pressure and sore throat.   Respiratory:  Positive for cough.   Cardiovascular: Negative for chest pain.  Gastrointestinal: Negative.   Genitourinary: Negative.   Musculoskeletal: Negative.   Skin: Negative.   All other systems reviewed and are negative.      Objective:    Physical Exam Vitals reviewed.  Constitutional:      Appearance: He is well-developed.  HENT:     Head: Normocephalic.     Mouth/Throat:     Mouth: Mucous membranes are moist.  Cardiovascular:     Rate and Rhythm: Normal rate and regular rhythm.     Pulses: Normal pulses.     Heart sounds: Normal heart sounds.  Pulmonary:     Breath sounds: Normal breath sounds. No wheezing.     Comments: Cough Abdominal:     General: Bowel sounds are normal.     Palpations: Abdomen is soft.  Musculoskeletal:        General: Normal range of motion.  Skin:    General: Skin is warm.  Neurological:     Mental Status: He is alert and oriented to person, place, and time.     BP (!) 148/92   Pulse 94   Temp (!) 97.3 F (36.3 C)   SpO2 94%  Wt Readings from Last 3 Encounters:  01/14/21 203 lb (92.1 kg)  09/14/20 198 lb 12.8 oz (90.2 kg)  02/20/20 198 lb 6.4 oz (90 kg)    There are no preventive care reminders to display for this patient.  There are no preventive care reminders to display for this patient.   Lab Results  Component Value Date   TSH 2.010 02/20/2020   Lab Results  Component Value Date   WBC 10.0 01/14/2021   HGB 13.5 01/14/2021   HCT 40.1 01/14/2021   MCV 92 01/14/2021   PLT 262 01/14/2021   Lab Results  Component Value Date   NA 142 01/14/2021   K 4.1 01/14/2021   CO2 20 01/14/2021   GLUCOSE 101 (H) 01/14/2021   BUN 16 01/14/2021   CREATININE 1.05 01/14/2021   BILITOT 0.6 01/14/2021   ALKPHOS 55 01/14/2021   AST 16 01/14/2021   ALT 16 01/14/2021   PROT 6.8 01/14/2021   ALBUMIN 4.4 01/14/2021   CALCIUM 9.1 01/14/2021   Lab Results  Component Value Date   CHOL 141 02/20/2020   Lab Results  Component Value Date    HDL 43 02/20/2020   Lab Results  Component Value Date   LDLCALC 67 02/20/2020   Lab Results  Component Value Date   TRIG 185 (H) 02/20/2020   Lab Results  Component Value Date   CHOLHDL 3.3 02/20/2020  Lab Results  Component Value Date   HGBA1C  01/15/2009    5.9 (NOTE)   The ADA recommends the following therapeutic goal for glycemic   control related to Hgb A1C measurement:   Goal of Therapy:   < 7.0% Hgb A1C   Reference: American Diabetes Association: Clinical Practice   Recommendations 2008, Diabetes Care,  2008, 31:(Suppl 1).       Assessment & Plan:   Problem List Items Addressed This Visit      Other   Cough - Primary    New cough symptoms not well controlled.  Completed COVID-19 swab results pending.  Started patient on guaifenesin.  And Augmentin.  Education provided to patient, continue to monitor oxygen saturation, monitor any signs and symptoms of fever,.  Rx sent to pharmacy.  Follow-up with worsening symptoms      Relevant Medications   doxycycline (VIBRA-TABS) 100 MG tablet   Other Relevant Orders   Novel Coronavirus, NAA ([facility])       Meds ordered this encounter  Medications  . doxycycline (VIBRA-TABS) 100 MG tablet    Sig: Take 1 tablet (100 mg total) by mouth 2 (two) times daily.    Dispense:  10 tablet    Refill:  0    Order Specific Question:   Supervising Provider    Answer:   Raliegh Ip [[id number]]     Daryll Drown, NP

## 2021-01-27 LAB — NOVEL CORONAVIRUS, NAA: SARS-CoV-2, NAA: NOT DETECTED

## 2021-01-27 LAB — SARS-COV-2, NAA 2 DAY TAT

## 2021-02-06 ENCOUNTER — Other Ambulatory Visit: Payer: Self-pay | Admitting: Family

## 2021-02-06 DIAGNOSIS — E785 Hyperlipidemia, unspecified: Secondary | ICD-10-CM

## 2021-02-06 DIAGNOSIS — I2510 Atherosclerotic heart disease of native coronary artery without angina pectoris: Secondary | ICD-10-CM

## 2021-02-06 DIAGNOSIS — I10 Essential (primary) hypertension: Secondary | ICD-10-CM

## 2021-02-06 DIAGNOSIS — E8881 Metabolic syndrome: Secondary | ICD-10-CM

## 2021-03-01 ENCOUNTER — Other Ambulatory Visit: Payer: Self-pay | Admitting: Family

## 2021-03-01 DIAGNOSIS — F411 Generalized anxiety disorder: Secondary | ICD-10-CM

## 2021-03-01 DIAGNOSIS — F331 Major depressive disorder, recurrent, moderate: Secondary | ICD-10-CM

## 2021-03-16 ENCOUNTER — Other Ambulatory Visit: Payer: Self-pay | Admitting: Family

## 2021-03-16 DIAGNOSIS — J4520 Mild intermittent asthma, uncomplicated: Secondary | ICD-10-CM

## 2021-03-16 NOTE — Telephone Encounter (Signed)
Historical med so I can't refill Last office visit 01/14/21

## 2021-03-23 ENCOUNTER — Ambulatory Visit (INDEPENDENT_AMBULATORY_CARE_PROVIDER_SITE_OTHER): Payer: Medicare Other

## 2021-03-23 VITALS — Ht 69.0 in | Wt 195.0 lb

## 2021-03-23 DIAGNOSIS — Z0000 Encounter for general adult medical examination without abnormal findings: Secondary | ICD-10-CM | POA: Diagnosis not present

## 2021-03-23 NOTE — Patient Instructions (Signed)
Mr. Casey Klein , Thank you for taking time to come for your Medicare Wellness Visit. I appreciate your ongoing commitment to your health goals. Please review the following plan we discussed and let me know if I can assist you in the future.   Screening recommendations/referrals: Colonoscopy: No longer required Recommended yearly ophthalmology/optometry visit for glaucoma screening and checkup Recommended yearly dental visit for hygiene and checkup  Vaccinations: Influenza vaccine: Done 09/14/2020 - repeat every yera Pneumococcal vaccine: Done 09/06/2013 & 09/14/2020 Tdap vaccine: Due Shingles vaccine: Zostavax done 05/17/2016; Shingrix discussed. Please contact your pharmacy for coverage information.    Covid-19: Done 04/20/20, 05/18/20, & 11/24/20  Advanced directives: Please bring a copy of your health care power of attorney and living will to the office to be added to your chart at your convenience.  Conditions/risks identified: Try to increase your physical activity to 30 minutes each day - get a cane for added support to prevent falls - continue all fall prevention measures; I have included balance and strength exercises to try at home.  Next appointment: Follow up in one year for your annual wellness visit.   Preventive Care 39 Years and Older, Male  Preventive care refers to lifestyle choices and visits with your health care provider that can promote health and wellness. What does preventive care include?  A yearly physical exam. This is also called an annual well check.  Dental exams once or twice a year.  Routine eye exams. Ask your health care provider how often you should have your eyes checked.  Personal lifestyle choices, including:  Daily care of your teeth and gums.  Regular physical activity.  Eating a healthy diet.  Avoiding tobacco and drug use.  Limiting alcohol use.  Practicing safe sex.  Taking low doses of aspirin every day.  Taking vitamin and mineral  supplements as recommended by your health care provider. What happens during an annual well check? The services and screenings done by your health care provider during your annual well check will depend on your age, overall health, lifestyle risk factors, and family history of disease. Counseling  Your health care provider may ask you questions about your:  Alcohol use.  Tobacco use.  Drug use.  Emotional well-being.  Home and relationship well-being.  Sexual activity.  Eating habits.  History of falls.  Memory and ability to understand (cognition).  Work and work Astronomer. Screening  You may have the following tests or measurements:  Height, weight, and BMI.  Blood pressure.  Lipid and cholesterol levels. These may be checked every 5 years, or more frequently if you are over 25 years old.  Skin check.  Lung cancer screening. You may have this screening every year starting at age 47 if you have a 30-pack-year history of smoking and currently smoke or have quit within the past 15 years.  Fecal occult blood test (FOBT) of the stool. You may have this test every year starting at age 82.  Flexible sigmoidoscopy or colonoscopy. You may have a sigmoidoscopy every 5 years or a colonoscopy every 10 years starting at age 89.  Prostate cancer screening. Recommendations will vary depending on your family history and other risks.  Hepatitis C blood test.  Hepatitis B blood test.  Sexually transmitted disease (STD) testing.  Diabetes screening. This is done by checking your blood sugar (glucose) after you have not eaten for a while (fasting). You may have this done every 1-3 years.  Abdominal aortic aneurysm (AAA) screening. You may need  this if you are a current or former smoker.  Osteoporosis. You may be screened starting at age 61 if you are at high risk. Talk with your health care provider about your test results, treatment options, and if necessary, the need for more  tests. Vaccines  Your health care provider may recommend certain vaccines, such as:  Influenza vaccine. This is recommended every year.  Tetanus, diphtheria, and acellular pertussis (Tdap, Td) vaccine. You may need a Td booster every 10 years.  Zoster vaccine. You may need this after age 22.  Pneumococcal 13-valent conjugate (PCV13) vaccine. One dose is recommended after age 66.  Pneumococcal polysaccharide (PPSV23) vaccine. One dose is recommended after age 46. Talk to your health care provider about which screenings and vaccines you need and how often you need them. This information is not intended to replace advice given to you by your health care provider. Make sure you discuss any questions you have with your health care provider. Document Released: 12/18/2015 Document Revised: 08/10/2016 Document Reviewed: 09/22/2015 Elsevier Interactive Patient Education  2017 [employer] Prevention in the Home Falls can cause injuries. They can happen to people of all ages. There are many things you can do to make your home safe and to help prevent falls. What can I do on the outside of my home?  Regularly fix the edges of walkways and driveways and fix any cracks.  Remove anything that might make you trip as you walk through a door, such as a raised step or threshold.  Trim any bushes or trees on the path to your home.  Use bright outdoor lighting.  Clear any walking paths of anything that might make someone trip, such as rocks or tools.  Regularly check to see if handrails are loose or broken. Make sure that both sides of any steps have handrails.  Any raised decks and porches should have guardrails on the edges.  Have any leaves, snow, or ice cleared regularly.  Use sand or salt on walking paths during winter.  Clean up any spills in your garage right away. This includes oil or grease spills. What can I do in the bathroom?  Use night lights.  Install grab bars by the  toilet and in the tub and shower. Do not use towel bars as grab bars.  Use non-skid mats or decals in the tub or shower.  If you need to sit down in the shower, use a plastic, non-slip stool.  Keep the floor dry. Clean up any water that spills on the floor as soon as it happens.  Remove soap buildup in the tub or shower regularly.  Attach bath mats securely with double-sided non-slip rug tape.  Do not have throw rugs and other things on the floor that can make you trip. What can I do in the bedroom?  Use night lights.  Make sure that you have a light by your bed that is easy to reach.  Do not use any sheets or blankets that are too big for your bed. They should not hang down onto the floor.  Have a firm chair that has side arms. You can use this for support while you get dressed.  Do not have throw rugs and other things on the floor that can make you trip. What can I do in the kitchen?  Clean up any spills right away.  Avoid walking on wet floors.  Keep items that you use a lot in easy-to-reach places.  If you  need to reach something above you, use a strong step stool that has a grab bar.  Keep electrical cords out of the way.  Do not use floor polish or wax that makes floors slippery. If you must use wax, use non-skid floor wax.  Do not have throw rugs and other things on the floor that can make you trip. What can I do with my stairs?  Do not leave any items on the stairs.  Make sure that there are handrails on both sides of the stairs and use them. Fix handrails that are broken or loose. Make sure that handrails are as long as the stairways.  Check any carpeting to make sure that it is firmly attached to the stairs. Fix any carpet that is loose or worn.  Avoid having throw rugs at the top or bottom of the stairs. If you do have throw rugs, attach them to the floor with carpet tape.  Make sure that you have a light switch at the top of the stairs and the bottom of  the stairs. If you do not have them, ask someone to add them for you. What else can I do to help prevent falls?  Wear shoes that:  Do not have high heels.  Have rubber bottoms.  Are comfortable and fit you well.  Are closed at the toe. Do not wear sandals.  If you use a stepladder:  Make sure that it is fully opened. Do not climb a closed stepladder.  Make sure that both sides of the stepladder are locked into place.  Ask someone to hold it for you, if possible.  Clearly mark and make sure that you can see:  Any grab bars or handrails.  First and last steps.  Where the edge of each step is.  Use tools that help you move around (mobility aids) if they are needed. These include:  Canes.  Walkers.  Scooters.  Crutches.  Turn on the lights when you go into a dark area. Replace any light bulbs as soon as they burn out.  Set up your furniture so you have a clear path. Avoid moving your furniture around.  If any of your floors are uneven, fix them.  If there are any pets around you, be aware of where they are.  Review your medicines with your doctor. Some medicines can make you feel dizzy. This can increase your chance of falling. Ask your doctor what other things that you can do to help prevent falls. This information is not intended to replace advice given to you by your health care provider. Make sure you discuss any questions you have with your health care provider. Document Released: 09/17/2009 Document Revised: 04/28/2016 Document Reviewed: 12/26/2014 Elsevier Interactive Patient Education  2017 [employer].

## 2021-03-23 NOTE — Progress Notes (Signed)
Subjective:   Casey Klein is a 80 y.o. male who presents for Medicare Annual/Subsequent preventive examination.  Virtual Visit via Telephone Note  I connected with  Casey Klein on 03/23/21 at  3:30 PM EDT by telephone and verified that I am speaking with the correct person using two identifiers.  Location: Patient: Home Provider: [facility] Persons participating in the virtual visit: patient/Nurse Health Advisor   I discussed the limitations, risks, security and privacy concerns of performing an evaluation and management service by telephone and the availability of in person appointments. The patient expressed understanding and agreed to proceed.  Interactive audio and video telecommunications were attempted between this nurse and patient, however failed, due to patient having technical difficulties OR patient did not have access to video capability.  We continued and completed visit with audio only.  Some vital signs may be absent or patient reported.   Casey Klein E Casey Goertz, LPN   Review of Systems     Cardiac Risk Factors include: advanced age (>4men, >23 women);hypertension;male gender;sedentary lifestyle;dyslipidemia     Objective:    Today's Vitals   03/23/21 1409  Weight: 195 lb (88.5 kg)  Height: 5\' 9"  (1.753 m)   Body mass index is 28.8 kg/m.  Advanced Directives 03/23/2021 03/18/2020 01/01/2019 01/11/2017 08/14/2015 06/18/2013 05/06/2013  Does Patient Have a Medical Advance Directive? Yes Yes No Yes No Patient would not like information;Patient does not have advance directive Patient does not have advance directive  Type of Advance Directive Healthcare Power of Liberty;Living will Living will - Healthcare Power of Attorney - - -  Does patient want to make changes to medical advance directive? - No - Patient declined - No - Patient declined - - -  Copy of Healthcare Power of Attorney in Chart? No - copy requested - - No - copy requested - - -  Would patient like information  on creating a medical advance directive? - No - Patient declined Yes (MAU/Ambulatory/Procedural Areas - Information given) - Yes - Educational materials given - -  Pre-existing out of facility DNR order (yellow form or pink MOST form) - - - - - No No    Current Medications (verified) Outpatient Encounter Medications as of 03/23/2021  Medication Sig  . amLODipine (NORVASC) 10 MG tablet TAKE 1 TABLET BY MOUTH EVERY DAY  . aspirin EC 81 MG tablet Take 1 tablet (81 mg total) by mouth daily.  03/25/2021 atorvastatin (LIPITOR) 10 MG tablet TAKE 1 TABLET BY MOUTH EVERY DAY  . budesonide-formoterol (SYMBICORT) 80-4.5 MCG/ACT inhaler TAKE 2 PUFFS BY MOUTH TWICE A DAY  . citalopram (CELEXA) 40 MG tablet Take 1 tablet (40 mg total) by mouth daily.  Marland Kitchen doxycycline (VIBRA-TABS) 100 MG tablet Take 1 tablet (100 mg total) by mouth 2 (two) times daily.  Marland Kitchen lisinopril (ZESTRIL) 10 MG tablet TAKE 1 TABLET BY MOUTH EVERY DAY  . meloxicam (MOBIC) 15 MG tablet TAKE 1 TABLET BY MOUTH EVERY DAY  . montelukast (SINGULAIR) 10 MG tablet TAKE 1 TABLET BY MOUTH EVERYDAY AT BEDTIME  . Vitamin D, Ergocalciferol, (DRISDOL) 1.25 MG (50000 UNIT) CAPS capsule Take 1 capsule (50,000 Units total) by mouth every 7 (seven) days.   No facility-administered encounter medications on file as of 03/23/2021.    Allergies (verified) Pneumococcal vaccines and Penicillins   History: Past Medical History:  Diagnosis Date  . Allergy   . Asthma   . CAD (coronary artery disease)    Nonobstructive  . Carotid stenosis  50% bilateral 2012  . Cataract   . History of kidney stones   . HTN (hypertension)   . PAT (paroxysmal atrial tachycardia) (HCC)    Past Surgical History:  Procedure Laterality Date  . BACK SURGERY     lumbar disc  . CATARACT EXTRACTION W/PHACO Right 05/06/2013   Procedure: CATARACT EXTRACTION PHACO AND INTRAOCULAR LENS PLACEMENT (IOC);  Surgeon: Susa Simmonds, MD;  Location: [facility];  Service: Ophthalmology;  Laterality:  Right;  CDE: 19.84  . CATARACT EXTRACTION W/PHACO Left 06/24/2013   Procedure: CATARACT EXTRACTION PHACO AND INTRAOCULAR LENS PLACEMENT (IOC);  Surgeon: Susa Simmonds, MD;  Location: [facility];  Service: Ophthalmology;  Laterality: Left;  CDE:  18.21  . CERVICAL DISC SURGERY    . EYE SURGERY     History reviewed. No pertinent family history. Social History   Socioeconomic History  . Marital status: Widowed    Spouse name: Not on file  . Number of children: 3  . Years of education: Not on file  . Highest education level: Not on file  Occupational History  . Not on file  Tobacco Use  . Smoking status: Former Smoker    Packs/day: 1.00    Years: 10.00    Pack years: 10.00    Types: Cigarettes    Start date: 02/07/1983  . Smokeless tobacco: Never Used  Vaping Use  . Vaping Use: Never used  Substance and Sexual Activity  . Alcohol use: No  . Drug use: No  . Sexual activity: Never    Birth control/protection: None  Other Topics Concern  . Not on file  Social History Narrative   Lives alone   Social Determinants of Health   Financial Resource Strain: Low Risk   . Difficulty of Paying Living Expenses: Not very hard  Food Insecurity: No Food Insecurity  . Worried About Programme researcher, broadcasting/film/video in the Last Year: Never true  . Ran Out of Food in the Last Year: Never true  Transportation Needs: No Transportation Needs  . Lack of Transportation (Medical): No  . Lack of Transportation (Non-Medical): No  Physical Activity: Inactive  . Days of Exercise per Week: 0 days  . Minutes of Exercise per Session: 0 min  Stress: No Stress Concern Present  . Feeling of Stress : Only a little  Social Connections: Moderately Isolated  . Frequency of Communication with Friends and Family: Once a week  . Frequency of Social Gatherings with Friends and Family: Once a week  . Attends Religious Services: More than 4 times per year  . Active Member of Clubs or Organizations: Yes  . Attends Tax inspector Meetings: More than 4 times per year  . Marital Status: Widowed    Tobacco Counseling Counseling given: No   Clinical Intake:  Pre-visit preparation completed: Yes  Pain : No/denies pain     BMI - recorded: 28.8 Nutritional Status: BMI 25 -29 Overweight Nutritional Risks: None Diabetes: No  How often do you need to have someone help you when you read instructions, pamphlets, or other written materials from your doctor or pharmacy?: 1 - Never  Diabetic? no  Interpreter Needed?: No  Information entered by :: Kionna Brier Hopkin, LPN   Activities of Daily Living In your present state of health, do you have any difficulty performing the following activities: 03/23/2021  Hearing? N  Vision? N  Difficulty concentrating or making decisions? N  Walking or climbing stairs? N  Dressing or bathing? N  Doing  errands, shopping? N  Preparing Food and eating ? N  Using the Toilet? N  In the past six months, have you accidently leaked urine? Y  Do you have problems with loss of bowel control? N  Managing your Medications? N  Managing your Finances? N  Housekeeping or managing your Housekeeping? N  Some recent data might be hidden    Patient Care Team: Junie SpencerHawks, [name] A, FNP as PCP - General (Nurse Practitioner) Rollene RotundaHochrein, James, MD as Consulting Physician (Cardiology)  Indicate any recent Medical Services you may have received from other than [facility] providers in the past year (date may be approximate).     Assessment:   This is a routine wellness examination for [place].  Hearing/Vision screen  Hearing Screening   125Hz  250Hz  500Hz  1000Hz  2000Hz  3000Hz  4000Hz  6000Hz  8000Hz   Right ear:           Left ear:           Comments: C/o mild hearing loss - declines hearing loss  Vision Screening Comments: Wears eyeglasses - hasn't seen eye doctor in years  Dietary issues and exercise activities discussed: Current Exercise Habits: The patient does not participate in regular  exercise at present, Exercise limited by: None identified  Goals    . Exercise 3x per week (30 min per time)     Try to exercise for at least 30 minutes 3 times weekly    . Have 3 meals a day     Try to eat at least 3 meals daily that consist of lean proteins, fruits, and vegetables.  Decrease soda and tea intake Increase water intake       Depression Screen PHQ 2/9 Scores 03/23/2021 01/26/2021 01/14/2021 09/14/2020 03/18/2020 02/20/2020 07/01/2019  PHQ - 2 Score 1 0 1 3 0 0 0  PHQ- 9 Score - - 8 14 - - 0    Fall Risk Fall Risk  03/23/2021 01/26/2021 09/14/2020 03/18/2020 02/20/2020  Falls in the past year? 0 0 1 1 1   Number falls in past yr: 0 - 1 1 1   Injury with Fall? 0 - 0 1 1  Comment - - - fractured ribs -  Risk Factor Category  - - - - -  Risk for fall due to : Impaired balance/gait;Orthopedic patient - Impaired balance/gait Impaired balance/gait -  Follow up Falls prevention discussed - Education provided Falls evaluation completed -    FALL RISK PREVENTION PERTAINING TO THE HOME:  Any stairs in or around the home? No  If so, are there any without handrails? No  Home free of loose throw rugs in walkways, pet beds, electrical cords, etc? Yes  Adequate lighting in your home to reduce risk of falls? Yes   ASSISTIVE DEVICES UTILIZED TO PREVENT FALLS:  Life alert? No  Use of a cane, walker or w/c? No  Grab bars in the bathroom? Yes  Shower chair or bench in shower? Yes  Elevated toilet seat or a handicapped toilet? No   TIMED UP AND GO:  Was the test performed? No . Telephonic visit.  Cognitive Function: MMSE - Mini Mental State Exam 01/01/2019 01/11/2017 08/14/2015  Orientation to time 4 5 5   Orientation to Place 5 5 5   Registration 3 3 3   Attention/ Calculation 0 5 0  Recall 0 3 3  Language- name 2 objects 2 2 2   Language- repeat 1 1 1   Language- follow 3 step command 2 3 3   Language- read & follow direction 0 1  1  Write a sentence 0 1 1  Copy design Total  score 6CIT Screen 03/23/2021 03/18/2020  What Year? 0 points 0 points  What month? 3 points 0 points  What time? 0 points 0 points  Count back from 20 0 points 0 points  Months in reverse 0 points 2 points  Repeat phrase 2 points 2 points  Total Score 5 4    Immunizations Immunization History  Administered Date(s) Administered  . Fluad Quad(high Dose 65+) 09/13/2019, 09/14/2020  . Influenza, High Dose Seasonal PF 09/06/2013, 08/30/2016, 09/11/2017, 11/05/2018  . Influenza,inj,Quad PF,6+ Mos 09/17/2014, 09/01/2015  . Moderna Sars-Covid-2 Vaccination 04/20/2020, 05/18/2020, 11/24/2020  . Pneumococcal Conjugate-13 09/14/2020  . Pneumococcal-Unspecified 09/06/2013  . Zoster 05/17/2016    TDAP status: Due, Education has been provided regarding the importance of this vaccine. Advised may receive this vaccine at local pharmacy or Health Dept. Aware to provide a copy of the vaccination record if obtained from local pharmacy or Health Dept. Verbalized acceptance and understanding.  Flu Vaccine status: Up to date  Pneumococcal vaccine status: Up to date  Covid-19 vaccine status: Completed vaccines  Qualifies for Shingles Vaccine? Yes   Zostavax completed Yes   Shingrix Completed?: No.    Education has been provided regarding the importance of this vaccine. Patient has been advised to call insurance company to determine out of pocket expense if they have not yet received this vaccine. Advised may also receive vaccine at local pharmacy or Health Dept. Verbalized acceptance and understanding.  Screening Tests Health Maintenance  Topic Date Due  . INFLUENZA VACCINE  07/05/2021  . TETANUS/TDAP  01/26/2023  . COVID-19 Vaccine  Completed  . PNA vac Low Risk Adult  Completed  . HPV VACCINES  Aged Out    Health Maintenance  There are no preventive care reminders to display for this patient.  Colorectal cancer screening: No longer required.   Lung Cancer Screening: (Low  Dose CT Chest recommended if Age 24-80 years, 30 pack-year currently smoking OR have quit w/in 15years.) does not qualify.   Additional Screening:  Hepatitis C Screening: does not qualify  Vision Screening: Recommended annual ophthalmology exams for early detection of glaucoma and other disorders of the eye. Is the patient up to date with their annual eye exam?  No  Who is the provider or what is the name of the office in which the patient attends annual eye exams? No provider If pt is not established with a provider, would they like to be referred to a provider to establish care? No .   Dental Screening: Recommended annual dental exams for proper oral hygiene  Community Resource Referral / Chronic Care Management: CRR required this visit?  No   CCM required this visit?  No      Plan:     I have personally reviewed and noted the following in the patient's chart:   . Medical and social history . Use of alcohol, tobacco or illicit drugs  . Current medications and supplements . Functional ability and status . Nutritional status . Physical activity . Advanced directives . List of other physicians . Hospitalizations, surgeries, and ER visits in previous 12 months . Vitals . Screenings to include cognitive, depression, and falls . Referrals and appointments  In addition, I have reviewed and discussed with patient certain preventive protocols, quality metrics, and best practice recommendations. A written personalized care plan for preventive services as well as  general preventive health recommendations were provided to patient.     [place] Constable, LPN   2/83/1517   Nurse Notes: None

## 2021-04-04 ENCOUNTER — Other Ambulatory Visit: Payer: Self-pay | Admitting: Family

## 2021-04-04 DIAGNOSIS — I2510 Atherosclerotic heart disease of native coronary artery without angina pectoris: Secondary | ICD-10-CM

## 2021-04-04 DIAGNOSIS — I10 Essential (primary) hypertension: Secondary | ICD-10-CM

## 2021-04-13 ENCOUNTER — Ambulatory Visit (INDEPENDENT_AMBULATORY_CARE_PROVIDER_SITE_OTHER): Payer: Medicare Other | Admitting: Family

## 2021-04-13 ENCOUNTER — Other Ambulatory Visit: Payer: Self-pay

## 2021-04-13 ENCOUNTER — Encounter: Payer: Self-pay | Admitting: Family

## 2021-04-13 VITALS — BP 164/89 | HR 69 | Temp 97.4°F | Ht 69.0 in | Wt 202.8 lb

## 2021-04-13 DIAGNOSIS — E538 Deficiency of other specified B group vitamins: Secondary | ICD-10-CM

## 2021-04-13 DIAGNOSIS — I2510 Atherosclerotic heart disease of native coronary artery without angina pectoris: Secondary | ICD-10-CM | POA: Diagnosis not present

## 2021-04-13 DIAGNOSIS — R000 Tachycardia, unspecified: Secondary | ICD-10-CM

## 2021-04-13 DIAGNOSIS — E785 Hyperlipidemia, unspecified: Secondary | ICD-10-CM

## 2021-04-13 DIAGNOSIS — F331 Major depressive disorder, recurrent, moderate: Secondary | ICD-10-CM

## 2021-04-13 DIAGNOSIS — I10 Essential (primary) hypertension: Secondary | ICD-10-CM | POA: Diagnosis not present

## 2021-04-13 DIAGNOSIS — M1990 Unspecified osteoarthritis, unspecified site: Secondary | ICD-10-CM

## 2021-04-13 DIAGNOSIS — J4520 Mild intermittent asthma, uncomplicated: Secondary | ICD-10-CM | POA: Diagnosis not present

## 2021-04-13 DIAGNOSIS — E663 Overweight: Secondary | ICD-10-CM

## 2021-04-13 DIAGNOSIS — E559 Vitamin D deficiency, unspecified: Secondary | ICD-10-CM

## 2021-04-13 DIAGNOSIS — F411 Generalized anxiety disorder: Secondary | ICD-10-CM

## 2021-04-13 NOTE — Progress Notes (Signed)
Subjective:    Patient ID: Casey Klein, male    DOB: 03-Jun-1941, 80 y.o.   MRN: [id number]  Chief Complaint  Patient presents with  . Hypertension    Has not had any meds this morning     Pt presents to the office today for chronic follow up Hypertension This is a chronic problem. The current episode started more than 1 year ago. The problem has been waxing and waning since onset. The problem is uncontrolled. Associated symptoms include anxiety, malaise/fatigue and peripheral edema ("some"). Pertinent negatives include no shortness of breath. Risk factors for coronary artery disease include dyslipidemia and male gender. The current treatment provides moderate improvement.  Asthma He complains of cough and wheezing. There is no shortness of breath. This is a chronic problem. The current episode started more than 1 year ago. The problem occurs intermittently. The problem has been waxing and waning. Associated symptoms include malaise/fatigue. His past medical history is significant for asthma.  Arthritis Presents for follow-up visit. He complains of pain and stiffness. The symptoms have been stable. Affected locations include the left knee, right knee, left MCP and right MCP. His pain is at a severity of 6/10.  Hyperlipidemia This is a chronic problem. The current episode started more than 1 year ago. The problem is controlled. Recent lipid tests were reviewed and are normal. Exacerbating diseases include obesity. Pertinent negatives include no shortness of breath. Current antihyperlipidemic treatment includes statins. The current treatment provides moderate improvement of lipids. Risk factors for coronary artery disease include dyslipidemia, diabetes mellitus, male sex, hypertension and a sedentary lifestyle.  Anxiety Presents for follow-up visit. Symptoms include excessive worry, irritability, nervous/anxious behavior and restlessness. Patient reports no shortness of breath. Symptoms  occur most days. The severity of symptoms is moderate.   His past medical history is significant for asthma.  Depression        This is a chronic problem.  The current episode started more than 1 year ago.   The onset quality is gradual.   The problem occurs intermittently.  Associated symptoms include restlessness.  Associated symptoms include no helplessness and no hopelessness.  Past treatments include SSRIs - Selective serotonin reuptake inhibitors.  Past medical history includes anxiety.       Review of Systems  Constitutional: Positive for irritability and malaise/fatigue.  Respiratory: Positive for cough and wheezing. Negative for shortness of breath.   Musculoskeletal: Positive for arthritis and stiffness.  Psychiatric/Behavioral: Positive for depression. The patient is nervous/anxious.   All other systems reviewed and are negative.      Objective:   Physical Exam Vitals reviewed.  Constitutional:      General: He is not in acute distress.    Appearance: He is well-developed.  HENT:     Head: Normocephalic.     Right Ear: External ear normal.     Left Ear: External ear normal.  Eyes:     General:        Right eye: No discharge.        Left eye: No discharge.     Pupils: Pupils are equal, round, and reactive to light.  Neck:     Thyroid: No thyromegaly.  Cardiovascular:     Rate and Rhythm: Normal rate and regular rhythm.     Heart sounds: Murmur heard.    Pulmonary:     Effort: Pulmonary effort is normal. No respiratory distress.     Breath sounds: Normal breath sounds. No wheezing.  Abdominal:  General: Bowel sounds are normal. There is no distension.     Palpations: Abdomen is soft.     Tenderness: There is no abdominal tenderness.  Musculoskeletal:        General: No tenderness. Normal range of motion.     Cervical back: Normal range of motion and neck supple.  Skin:    General: Skin is warm and dry.     Findings: No erythema or rash.  Neurological:      Mental Status: He is alert and oriented to person, place, and time.     Cranial Nerves: No cranial nerve deficit.     Deep Tendon Reflexes: Reflexes are normal and symmetric.  Psychiatric:        Behavior: Behavior normal.        Thought Content: Thought content normal.        Judgment: Judgment normal.       BP (!) 164/89   Pulse 69   Temp (!) 97.4 F (36.3 C) (Temporal)   Ht 5' 9"  (1.753 m)   Wt 202 lb 12.8 oz (92 kg)   SpO2 95%   BMI 29.95 kg/m      Assessment & Plan:  SAURABH HETTICH comes in today with chief complaint of Hypertension (Has not had any meds this morning )   Diagnosis and orders addressed:  1. Tachycardia - EKG 12-Lead - CMP14+EGFR - CBC with Differential/Platelet  2. Essential hypertension, benign - CMP14+EGFR - CBC with Differential/Platelet  3. Coronary artery disease involving native coronary artery of native heart without angina pectoris - CMP14+EGFR - CBC with Differential/Platelet  4. Mild intermittent asthma without complication - HUT65+YYTK - CBC with Differential/Platelet  5. Arthritis - CMP14+EGFR - CBC with Differential/Platelet  6. Vitamin D deficiency - CMP14+EGFR - CBC with Differential/Platelet  7. Vitamin B 12 deficiency - CMP14+EGFR - CBC with Differential/Platelet  8. Overweight (BMI 25.0-29.9) - CMP14+EGFR - CBC with Differential/Platelet  9. Hyperlipidemia, unspecified hyperlipidemia type - CMP14+EGFR - CBC with Differential/Platelet - Lipid panel  10. Moderate episode of recurrent major depressive disorder (HCC) - CMP14+EGFR - CBC with Differential/Platelet  11. GAD (generalized anxiety disorder) - CMP14+EGFR - CBC with Differential/Platelet   Labs pending Health Maintenance reviewed Diet and exercise encouraged  Follow up plan: 1 month to recheck HTN and make sure to take medication prior to visit.    Evelina Dun, FNP

## 2021-04-13 NOTE — Patient Instructions (Signed)

## 2021-04-14 LAB — CMP14+EGFR
ALT: 19 IU/L (ref 0–44)
AST: 18 IU/L (ref 0–40)
Albumin/Globulin Ratio: 1.9 (ref 1.2–2.2)
Albumin: 4.5 g/dL (ref 3.6–4.6)
Alkaline Phosphatase: 57 IU/L (ref 44–121)
BUN/Creatinine Ratio: 10 (ref 10–24)
BUN: 10 mg/dL (ref 8–27)
Bilirubin Total: 0.9 mg/dL (ref 0.0–1.2)
CO2: 24 mmol/L (ref 20–29)
Calcium: 8.9 mg/dL (ref 8.6–10.2)
Chloride: 104 mmol/L (ref 96–106)
Creatinine, Ser: 0.96 mg/dL (ref 0.76–1.27)
Globulin, Total: 2.4 g/dL (ref 1.5–4.5)
Glucose: 87 mg/dL (ref 65–99)
Potassium: 4.4 mmol/L (ref 3.5–5.2)
Sodium: 141 mmol/L (ref 134–144)
Total Protein: 6.9 g/dL (ref 6.0–8.5)
eGFR: 79 mL/min/{1.73_m2} (ref >59)

## 2021-04-14 LAB — CBC WITH DIFFERENTIAL/PLATELET
Basophils Absolute: 0.1 10*3/uL (ref 0.0–0.2)
Basos: 1 %
EOS (ABSOLUTE): 0.4 10*3/uL (ref 0.0–0.4)
Eos: 3 %
Hematocrit: 44.4 % (ref 37.5–51.0)
Hemoglobin: 14.5 g/dL (ref 13.0–17.7)
Immature Grans (Abs): 0.1 10*3/uL (ref 0.0–0.1)
Immature Granulocytes: 1 %
Lymphocytes Absolute: 1.8 10*3/uL (ref 0.7–3.1)
Lymphs: 12 %
MCH: 30.5 pg (ref 26.6–33.0)
MCHC: 32.7 g/dL (ref 31.5–35.7)
MCV: 94 fL (ref 79–97)
Monocytes Absolute: 1 10*3/uL — ABNORMAL HIGH (ref 0.1–0.9)
Monocytes: 7 %
Neutrophils Absolute: 10.8 10*3/uL — ABNORMAL HIGH (ref 1.4–7.0)
Neutrophils: 76 %
Platelets: 272 10*3/uL (ref 150–450)
RBC: 4.75 x10E6/uL (ref 4.14–5.80)
RDW: 17.5 % — ABNORMAL HIGH (ref 11.6–15.4)
WBC: 14.3 10*3/uL — ABNORMAL HIGH (ref 3.4–10.8)

## 2021-04-14 LAB — LIPID PANEL
Chol/HDL Ratio: 3.3 ratio (ref 0.0–5.0)
Cholesterol, Total: 151 mg/dL (ref 100–199)
HDL: 46 mg/dL (ref >39)
LDL Chol Calc (NIH): 80 mg/dL (ref 0–99)
Triglycerides: 144 mg/dL (ref 0–149)
VLDL Cholesterol Cal: 25 mg/dL (ref 5–40)

## 2021-05-09 ENCOUNTER — Other Ambulatory Visit: Payer: Self-pay | Admitting: Family

## 2021-05-09 DIAGNOSIS — E559 Vitamin D deficiency, unspecified: Secondary | ICD-10-CM

## 2021-05-14 ENCOUNTER — Other Ambulatory Visit: Payer: Self-pay

## 2021-05-14 ENCOUNTER — Encounter: Payer: Self-pay | Admitting: Family

## 2021-05-14 ENCOUNTER — Ambulatory Visit (INDEPENDENT_AMBULATORY_CARE_PROVIDER_SITE_OTHER): Payer: Medicare Other | Admitting: Family

## 2021-05-14 VITALS — BP 135/74 | HR 73 | Temp 97.9°F | Ht 69.0 in | Wt 203.2 lb

## 2021-05-14 DIAGNOSIS — I10 Essential (primary) hypertension: Secondary | ICD-10-CM | POA: Diagnosis not present

## 2021-05-14 DIAGNOSIS — J4520 Mild intermittent asthma, uncomplicated: Secondary | ICD-10-CM | POA: Diagnosis not present

## 2021-05-14 DIAGNOSIS — R059 Cough, unspecified: Secondary | ICD-10-CM

## 2021-05-14 DIAGNOSIS — D72829 Elevated white blood cell count, unspecified: Secondary | ICD-10-CM | POA: Diagnosis not present

## 2021-05-14 LAB — CBC WITH DIFFERENTIAL/PLATELET
Basophils Absolute: 0.1 10*3/uL (ref 0.0–0.2)
Basos: 1 %
EOS (ABSOLUTE): 0.3 10*3/uL (ref 0.0–0.4)
Eos: 3 %
Hematocrit: 40 % (ref 37.5–51.0)
Hemoglobin: 13.5 g/dL (ref 13.0–17.7)
Immature Grans (Abs): 0.1 10*3/uL (ref 0.0–0.1)
Immature Granulocytes: 1 %
Lymphocytes Absolute: 2.4 10*3/uL (ref 0.7–3.1)
Lymphs: 20 %
MCH: 30.8 pg (ref 26.6–33.0)
MCHC: 33.8 g/dL (ref 31.5–35.7)
MCV: 91 fL (ref 79–97)
Monocytes Absolute: 1.1 10*3/uL — ABNORMAL HIGH (ref 0.1–0.9)
Monocytes: 10 %
Neutrophils Absolute: 7.7 10*3/uL — ABNORMAL HIGH (ref 1.4–7.0)
Neutrophils: 65 %
Platelets: 267 10*3/uL (ref 150–450)
RBC: 4.39 x10E6/uL (ref 4.14–5.80)
RDW: 17.7 % — ABNORMAL HIGH (ref 11.6–15.4)
WBC: 11.7 10*3/uL — ABNORMAL HIGH (ref 3.4–10.8)

## 2021-05-14 MED ORDER — ALBUTEROL SULFATE HFA 108 (90 BASE) MCG/ACT IN AERS: 2.0000 | INHALATION_SPRAY | Freq: Four times a day (QID) | RESPIRATORY_TRACT | 0 refills | Status: DC | PRN

## 2021-05-14 MED ORDER — PREDNISONE 10 MG (21) PO TBPK: ORAL_TABLET | ORAL | 0 refills | Status: DC

## 2021-05-14 NOTE — Progress Notes (Signed)
Subjective:    Patient ID: Casey Klein, male    DOB: 1941/08/19, 80 y.o.   MRN: [id number]  Chief Complaint  Patient presents with   Hypertension    1 mth follow up    Pt presents to the office today to recheck HTN. His BP is at goal today. He was seen on 04/13/21 and he had not taken his medications prior to his visit and his BP was 164/89.   He was also found to have an elevated WBC on his last lab work. He denies any dysuria, fever. Reports a mild cough.  Hypertension This is a chronic problem. The current episode started more than 1 year ago. The problem has been resolved since onset. The problem is controlled. Associated symptoms include malaise/fatigue and shortness of breath. Pertinent negatives include no peripheral edema. The current treatment provides moderate improvement.  Cough This is a new problem. The current episode started 1 to 4 weeks ago. The problem has been waxing and waning. The problem occurs every few minutes. The cough is Productive of purulent sputum. Associated symptoms include nasal congestion, shortness of breath and wheezing. Pertinent negatives include no chills, ear congestion, ear pain, fever or sore throat.     Review of Systems  Constitutional:  Positive for malaise/fatigue. Negative for chills and fever.  HENT:  Negative for ear pain and sore throat.   Respiratory:  Positive for cough, shortness of breath and wheezing.       Objective:   Physical Exam Vitals reviewed.  Constitutional:      General: He is not in acute distress.    Appearance: He is well-developed.  HENT:     Head: Normocephalic.     Right Ear: Tympanic membrane normal.     Left Ear: Tympanic membrane normal.  Eyes:     General:        Right eye: No discharge.        Left eye: No discharge.     Pupils: Pupils are equal, round, and reactive to light.  Neck:     Thyroid: No thyromegaly.  Cardiovascular:     Rate and Rhythm: Normal rate and regular rhythm.     Heart  sounds: Normal heart sounds. No murmur heard. Pulmonary:     Effort: Pulmonary effort is normal. No respiratory distress.     Breath sounds: Normal breath sounds. Decreased air movement present. No wheezing.  Abdominal:     General: Bowel sounds are normal. There is no distension.     Palpations: Abdomen is soft.     Tenderness: There is no abdominal tenderness.  Musculoskeletal:        General: No tenderness. Normal range of motion.     Cervical back: Normal range of motion and neck supple.  Skin:    General: Skin is warm and dry.     Findings: No erythema or rash.  Neurological:     Mental Status: He is alert and oriented to person, place, and time.     Cranial Nerves: No cranial nerve deficit.     Deep Tendon Reflexes: Reflexes are normal and symmetric.  Psychiatric:        Behavior: Behavior normal.        Thought Content: Thought content normal.        Judgment: Judgment normal.         BP 135/74   Pulse 73   Temp 97.9 F (36.6 C) (Temporal)   Ht 5\' 9"  (1.753 m)  Wt 203 lb 3.2 oz (92.2 kg)   BMI 30.01 kg/m   Assessment & Plan:  CONER GIBBARD comes in today with chief complaint of Hypertension (1 mth follow up )   Diagnosis and orders addressed:  1. Essential hypertension, benign At goal! Continue medications  2. Leukocytosis, unspecified type - CBC with Differential/Platelet  3. Cough  4. Mild intermittent asthma without complication - Take meds as prescribed - Use a cool mist humidifier  -Use saline nose sprays frequently -Force fluids -For any cough or congestion  Use plain Mucinex- regular strength or max strength is fine -For fever or aces or pains- take tylenol or ibuprofen. -Throat lozenges if help RTO if symptoms worsen or do not improve  - predniSONE (STERAPRED UNI-PAK 21 TAB) 10 MG (21) TBPK tablet; Use as directed  Dispense: 21 tablet; Refill: 0 - albuterol (VENTOLIN HFA) 108 (90 Base) MCG/ACT inhaler; Inhale 2 puffs into the lungs  every 6 (six) hours as needed for wheezing or shortness of breath.  Dispense: 8 g; Refill: 0    Follow up plan: 3 months   Jannifer Rodney, FNP

## 2021-05-14 NOTE — Patient Instructions (Signed)
http://www.aaaai.org/conditions-and-treatments/asthma">  Asthma, Adult  Asthma is a long-term (chronic) condition that causes recurrent episodes in which the airways become tight and narrow. The airways are the passages that lead from the nose and mouth down into the lungs. Asthma episodes, also called asthma attacks, can cause coughing, wheezing, shortness of breath, and chest pain. The airways can also fill with mucus. During an attack, it can be difficult to breathe. Asthmaattacks can range from minor to life threatening. Asthma cannot be cured, but medicines and lifestyle changes can help control itand treat acute attacks. What are the causes? This condition is believed to be caused by inherited (genetic) and environmental factors, but its exact cause is not known. There are many things that can bring on an asthma attack or make asthma symptoms worse (triggers). Asthma triggers are different for each person. Common triggers include: Mold. Dust. Cigarette smoke. Cockroaches. Things that can cause allergy symptoms (allergens), such as animal dander or pollen from trees or grass. Air pollutants such as household cleaners, wood smoke, smog, or chemical odors. Cold air, weather changes, and winds (which increase molds and pollen in the air). Strong emotional expressions such as crying or laughing hard. Stress. Certain medicines (such as aspirin) or types of medicines (such as beta-blockers). Sulfites in foods and drinks. Foods and drinks that may contain sulfites include dried fruit, potato chips, and sparkling grape juice. Infections or inflammatory conditions such as the flu, a cold, or inflammation of the nasal membranes (rhinitis). Gastroesophageal reflux disease (GERD). Exercise or strenuous activity. What are the signs or symptoms? Symptoms of this condition may occur right after asthma is triggered or many hours later. Symptoms include: Wheezing. This can sound like whistling when you  breathe. Excessive nighttime or early morning coughing. Frequent or severe coughing with a common cold. Chest tightness. Shortness of breath. Tiredness (fatigue) with minimal activity. How is this diagnosed? This condition is diagnosed based on: Your medical history. A physical exam. Tests, which may include: Lung function studies and pulmonary studies (spirometry). These tests can evaluate the flow of air in your lungs. Allergy tests. Imaging tests, such as X-rays. How is this treated? There is no cure for this condition, but treatment can help control your symptoms. Treatment for asthma usually involves: Identifying and avoiding your asthma triggers. Using medicines to control your symptoms. Generally, two types of medicines are used to treat asthma: Controller medicines. These help prevent asthma symptoms from occurring. They are usually taken every day. Fast-acting reliever or rescue medicines. These quickly relieve asthma symptoms by widening the narrow and tight airways. They are used as needed and provide short-term relief. Using supplemental oxygen. This may be needed during a severe episode. Using other medicines, such as: Allergy medicines, such as antihistamines, if your asthma attacks are triggered by allergens. Immune medicines (immunomodulators). These are medicines that help control the immune system. Creating an asthma action plan. An asthma action plan is a written plan for managing and treating your asthma attacks. This plan includes: A list of your asthma triggers and how to avoid them. Information about when medicines should be taken and when their dosage should be changed. Instructions about using a device called a peak flow meter. A peak flow meter measures how well the lungs are working and the severity of your asthma. It helps you monitor your condition. Follow these instructions at home: Controlling your home environment Control your home environment in the  following ways to help avoid triggers and prevent asthma attacks: Change your   heating and air conditioning filter regularly. Limit your use of fireplaces and wood stoves. Get rid of pests (such as roaches and mice) and their droppings. Throw away plants if you see mold on them. Clean floors and dust surfaces regularly. Use unscented cleaning products. Try to have someone else vacuum for you regularly. Stay out of rooms while they are being vacuumed and for a short while afterward. If you vacuum, use a dust mask from a hardware store, a double-layered or microfilter vacuum cleaner bag, or a vacuum cleaner with a HEPA filter. Replace carpet with wood, tile, or vinyl flooring. Carpet can trap dander and dust. Use allergy-proof pillows, mattress covers, and box spring covers. Keep your bedroom a trigger-free room. Avoid pets and keep windows closed when allergens are in the air. Wash beddings every week in hot water and dry them in a dryer. Use blankets that are made of polyester or cotton. Clean bathrooms and kitchens with bleach. If possible, have someone repaint the walls in these rooms with mold-resistant paint. Stay out of the rooms that are being cleaned and painted. Wash your hands often with soap and water. If soap and water are not available, use hand sanitizer. Do not allow anyone to smoke in your home. General instructions Take over-the-counter and prescription medicines only as told by your health care provider. Speak with your health care provider if you have questions about how or when to take the medicines. Make note if you are requiring more frequent dosages. Do not use any products that contain nicotine or tobacco, such as cigarettes and e-cigarettes. If you need help quitting, ask your health care provider. Also, avoid being exposed to secondhand smoke. Use a peak flow meter as told by your health care provider. Record and keep track of the readings. Understand and use the asthma  action plan to help minimize, or stop an asthma attack, without needing to seek medical care. Make sure you stay up to date on your yearly vaccinations as told by your health care provider. This may include vaccines for the flu and pneumonia. Avoid outdoor activities when allergen counts are high and when air quality is low. Wear a ski mask that covers your nose and mouth during outdoor winter activities. Exercise indoors on cold days if you can. Warm up before exercising, and take time for a cool-down period after exercise. Keep all follow-up visits as told by your health care provider. This is important. Where to find more information For information about asthma, turn to the Centers for Disease Control and Prevention at www.cdc.gov/asthma/faqs For air quality information, turn to [employer]Now at airnow.gov Contact a health care provider if: You have wheezing, shortness of breath, or a cough even while you are taking medicine to prevent attacks. The mucus you cough up (sputum) is thicker than usual. Your sputum changes from clear or white to yellow, green, gray, or bloody. Your medicines are causing side effects, such as a rash, itching, swelling, or trouble breathing. You need to use a reliever medicine more than 2-3 times a week. Your peak flow reading is still at 50-79% of your personal best after following your action plan for 1 hour. You have a fever. Get help right away if: You are getting worse and do not respond to treatment during an asthma attack. You are short of breath when at rest or when doing very little physical activity. You have difficulty eating, drinking, or talking. You have chest pain or tightness. You develop a fast heartbeat   or palpitations. You have a bluish color to your lips or fingernails. You are light-headed or dizzy, or you faint. Your peak flow reading is less than 50% of your personal best. You feel too tired to breathe normally. Summary Asthma is a long-term  (chronic) condition that causes recurrent episodes in which the airways become tight and narrow. These episodes can cause coughing, wheezing, shortness of breath, and chest pain. Asthma cannot be cured, but medicines and lifestyle changes can help control it and treat acute attacks. Make sure you understand how to avoid triggers and how and when to use your medicines. Asthma attacks can range from minor to life threatening. Get help right away if you have an asthma attack and do not respond to treatment with your usual rescue medicines. This information is not intended to replace advice given to you by your health care provider. Make sure you discuss any questions you have with your healthcare provider. Document Revised: 08/21/2020 Document Reviewed: 03/25/2020 Elsevier Patient Education  2022 [employer]  

## 2021-05-27 ENCOUNTER — Other Ambulatory Visit: Payer: Self-pay | Admitting: Family

## 2021-05-27 DIAGNOSIS — F331 Major depressive disorder, recurrent, moderate: Secondary | ICD-10-CM

## 2021-05-27 DIAGNOSIS — F411 Generalized anxiety disorder: Secondary | ICD-10-CM

## 2021-06-30 ENCOUNTER — Other Ambulatory Visit: Payer: Self-pay | Admitting: Family

## 2021-06-30 DIAGNOSIS — I2510 Atherosclerotic heart disease of native coronary artery without angina pectoris: Secondary | ICD-10-CM

## 2021-06-30 DIAGNOSIS — I10 Essential (primary) hypertension: Secondary | ICD-10-CM

## 2021-08-05 ENCOUNTER — Other Ambulatory Visit: Payer: Self-pay | Admitting: Family

## 2021-08-05 DIAGNOSIS — I2510 Atherosclerotic heart disease of native coronary artery without angina pectoris: Secondary | ICD-10-CM

## 2021-08-05 DIAGNOSIS — I10 Essential (primary) hypertension: Secondary | ICD-10-CM

## 2021-08-05 DIAGNOSIS — E8881 Metabolic syndrome: Secondary | ICD-10-CM

## 2021-08-05 DIAGNOSIS — E785 Hyperlipidemia, unspecified: Secondary | ICD-10-CM

## 2021-08-16 ENCOUNTER — Telehealth: Payer: Self-pay | Admitting: Family

## 2021-08-16 NOTE — Telephone Encounter (Signed)
Daughter said blood pressure was elevated yesterday and she is concerned.  She is unsure if he is taking his blood pressure medications as he should.  He has an appointment scheduled in the morning with [name] at 8:40 am.  Daughter cannot come with him because she is sick but his girlfriend is coming and will come back with him.  Advised her just to monitor until then and if his blood pressure got crazy to call 911.

## 2021-08-16 NOTE — Telephone Encounter (Signed)
Left message to call back  

## 2021-08-17 ENCOUNTER — Ambulatory Visit (INDEPENDENT_AMBULATORY_CARE_PROVIDER_SITE_OTHER): Payer: Medicare Other | Admitting: Family

## 2021-08-17 ENCOUNTER — Other Ambulatory Visit: Payer: Self-pay

## 2021-08-17 ENCOUNTER — Encounter: Payer: Self-pay | Admitting: Family

## 2021-08-17 VITALS — BP 122/71 | HR 73 | Temp 97.9°F | Ht 69.0 in | Wt 197.8 lb

## 2021-08-17 DIAGNOSIS — E785 Hyperlipidemia, unspecified: Secondary | ICD-10-CM

## 2021-08-17 DIAGNOSIS — I10 Essential (primary) hypertension: Secondary | ICD-10-CM

## 2021-08-17 DIAGNOSIS — J4520 Mild intermittent asthma, uncomplicated: Secondary | ICD-10-CM

## 2021-08-17 DIAGNOSIS — F331 Major depressive disorder, recurrent, moderate: Secondary | ICD-10-CM

## 2021-08-17 DIAGNOSIS — E663 Overweight: Secondary | ICD-10-CM

## 2021-08-17 DIAGNOSIS — M1990 Unspecified osteoarthritis, unspecified site: Secondary | ICD-10-CM

## 2021-08-17 DIAGNOSIS — I2510 Atherosclerotic heart disease of native coronary artery without angina pectoris: Secondary | ICD-10-CM | POA: Diagnosis not present

## 2021-08-17 DIAGNOSIS — Z23 Encounter for immunization: Secondary | ICD-10-CM

## 2021-08-17 DIAGNOSIS — F411 Generalized anxiety disorder: Secondary | ICD-10-CM

## 2021-08-17 DIAGNOSIS — E559 Vitamin D deficiency, unspecified: Secondary | ICD-10-CM | POA: Diagnosis not present

## 2021-08-17 LAB — BMP8+EGFR
BUN/Creatinine Ratio: 12 (ref 10–24)
BUN: 14 mg/dL (ref 8–27)
CO2: 22 mmol/L (ref 20–29)
Calcium: 9.1 mg/dL (ref 8.6–10.2)
Chloride: 106 mmol/L (ref 96–106)
Creatinine, Ser: 1.2 mg/dL (ref 0.76–1.27)
Glucose: 79 mg/dL (ref 65–99)
Potassium: 4.4 mmol/L (ref 3.5–5.2)
Sodium: 142 mmol/L (ref 134–144)
eGFR: 61 mL/min/{1.73_m2} (ref >59)

## 2021-08-17 NOTE — Progress Notes (Signed)
Subjective:    Patient ID: Casey Klein, male    DOB: 12-11-1940, 80 y.o.   MRN: [id number]  Chief Complaint  Patient presents with   Medical Management of Chronic Issues   Pt presents to the office today for chronic follow up. Hypertension This is a chronic problem. The current episode started more than 1 year ago. The problem has been resolved since onset. The problem is controlled. Associated symptoms include anxiety, malaise/fatigue and peripheral edema ("if I stay on my feet all day"). Risk factors for coronary artery disease include dyslipidemia, obesity and male gender. The current treatment provides moderate improvement.  Asthma He complains of cough and wheezing. This is a chronic problem. The current episode started more than 1 year ago. The problem occurs intermittently. Associated symptoms include malaise/fatigue. His symptoms are alleviated by rest. His past medical history is significant for asthma.  Arthritis Presents for follow-up visit. He complains of pain and stiffness. The symptoms have been stable. Affected locations include the left knee, right knee, left MCP, right MCP, left shoulder and right shoulder. His pain is at a severity of 4/10.  Hyperlipidemia This is a chronic problem. The current episode started more than 1 year ago. The problem is controlled. Recent lipid tests were reviewed and are normal. Exacerbating diseases include obesity. Current antihyperlipidemic treatment includes statins. The current treatment provides moderate improvement of lipids. Risk factors for coronary artery disease include dyslipidemia, male sex, hypertension and a sedentary lifestyle.  Anxiety Presents for follow-up visit. Symptoms include excessive worry, nervous/anxious behavior and restlessness. Symptoms occur most days. The severity of symptoms is moderate.   His past medical history is significant for asthma.  Depression        This is a chronic problem.  The current episode  started more than 1 year ago.   The problem occurs intermittently.  Associated symptoms include helplessness, restlessness and sad.  Associated symptoms include no hopelessness.  Past treatments include SSRIs - Selective serotonin reuptake inhibitors.  Compliance with treatment is good.  Past medical history includes anxiety.      Review of Systems  Constitutional:  Positive for malaise/fatigue.  Respiratory:  Positive for cough and wheezing.   Musculoskeletal:  Positive for arthritis and stiffness.  Psychiatric/Behavioral:  Positive for depression. The patient is nervous/anxious.   All other systems reviewed and are negative.     Objective:   Physical Exam Vitals reviewed.  Constitutional:      General: He is not in acute distress.    Appearance: He is well-developed.  HENT:     Head: Normocephalic.     Right Ear: Tympanic membrane normal.     Left Ear: Tympanic membrane normal.  Eyes:     General:        Right eye: No discharge.        Left eye: No discharge.     Pupils: Pupils are equal, round, and reactive to light.  Neck:     Thyroid: No thyromegaly.  Cardiovascular:     Rate and Rhythm: Normal rate and regular rhythm.     Heart sounds: Normal heart sounds. No murmur heard. Pulmonary:     Effort: Pulmonary effort is normal. No respiratory distress.     Breath sounds: Normal breath sounds. No wheezing.  Abdominal:     General: Bowel sounds are normal. There is no distension.     Palpations: Abdomen is soft.     Tenderness: There is no abdominal tenderness.  Musculoskeletal:  General: No tenderness. Normal range of motion.     Cervical back: Normal range of motion and neck supple.  Skin:    General: Skin is warm and dry.     Findings: No erythema or rash.  Neurological:     Mental Status: He is alert and oriented to person, place, and time.     Cranial Nerves: No cranial nerve deficit.     Deep Tendon Reflexes: Reflexes are normal and symmetric.  Psychiatric:         Behavior: Behavior normal.        Thought Content: Thought content normal.        Judgment: Judgment normal.      BP 122/71   Pulse 73   Temp 97.9 F (36.6 C) (Temporal)   Ht _0  (1.753 m)   Wt 197 lb 12.8 oz (89.7 kg)   BMI 29.21 kg/m      Assessment & Plan:  Casey Klein comes in today with chief complaint of Medical Management of Chronic Issues   Diagnosis and orders addressed:  1. Coronary artery disease involving native coronary artery of native heart without angina pectoris - BMP8+EGFR  2. Essential hypertension, benign - BMP8+EGFR  3. Mild intermittent asthma without complication - ZPH1+TAVW  4. Arthritis - BMP8+EGFR  5. Hyperlipidemia, unspecified hyperlipidemia type - BMP8+EGFR  6. Overweight (BMI 25.0-29.9) - BMP8+EGFR  7. GAD (generalized anxiety disorder) - BMP8+EGFR  8. Moderate episode of recurrent major depressive disorder (HCC) - BMP8+EGFR  9. Vitamin D deficiency - BMP8+EGFR   Labs pending Health Maintenance reviewed Diet and exercise encouraged  Follow up plan: 4 months    Evelina Dun, FNP

## 2021-08-17 NOTE — Patient Instructions (Signed)
Health Maintenance After Age 82 After age 82, you are at a higher risk for certain long-term diseases and infections as well as injuries from falls. Falls are a major cause of broken bones and head injuries in people who are older than age 82. Getting regular preventive care can help to keep you healthy and well. Preventive care includes getting regular testing and making lifestyle changes as recommended by your health care provider. Talk with your health care provider about: Which screenings and tests you should have. A screening is a test that checks for a disease when you have no symptoms. A diet and exercise plan that is right for you. What should I know about screenings and tests to prevent falls? Screening and testing are the best ways to find a health problem early. Early diagnosis and treatment give you the best chance of managing medical conditions that are common after age 82. Certain conditions and lifestyle choices may make you more likely to have a fall. Your health care provider may recommend: Regular vision checks. Poor vision and conditions such as cataracts can make you more likely to have a fall. If you wear glasses, make sure to get your prescription updated if your vision changes. Medicine review. Work with your health care provider to regularly review all of the medicines you are taking, including over-the-counter medicines. Ask your health care provider about any side effects that may make you more likely to have a fall. Tell your health care provider if any medicines that you take make you feel dizzy or sleepy. Osteoporosis screening. Osteoporosis is a condition that causes the bones to get weaker. This can make the bones weak and cause them to break more easily. Blood pressure screening. Blood pressure changes and medicines to control blood pressure can make you feel dizzy. Strength and balance checks. Your health care provider may recommend certain tests to check your strength and  balance while standing, walking, or changing positions. Foot health exam. Foot pain and numbness, as well as not wearing proper footwear, can make you more likely to have a fall. Depression screening. You may be more likely to have a fall if you have a fear of falling, feel emotionally low, or feel unable to do activities that you used to do. Alcohol use screening. Using too much alcohol can affect your balance and may make you more likely to have a fall. What actions can I take to lower my risk of falls? General instructions Talk with your health care provider about your risks for falling. Tell your health care provider if: You fall. Be sure to tell your health care provider about all falls, even ones that seem minor. You feel dizzy, sleepy, or off-balance. Take over-the-counter and prescription medicines only as told by your health care provider. These include any supplements. Eat a healthy diet and maintain a healthy weight. A healthy diet includes low-fat dairy products, low-fat (lean) meats, and fiber from whole grains, beans, and lots of fruits and vegetables. Home safety Remove any tripping hazards, such as rugs, cords, and clutter. Install safety equipment such as grab bars in bathrooms and safety rails on stairs. Keep rooms and walkways well-lit. Activity  Follow a regular exercise program to stay fit. This will help you maintain your balance. Ask your health care provider what types of exercise are appropriate for you. If you need a cane or walker, use it as recommended by your health care provider. Wear supportive shoes that have nonskid soles. Lifestyle Do not   drink alcohol if your health care provider tells you not to drink. If you drink alcohol, limit how much you have: 0-1 drink a day for women. 0-2 drinks a day for men. Be aware of how much alcohol is in your drink. In the [place] one drink equals one typical bottle of beer (12 oz), one-half glass of wine (5 oz), or one shot of  hard liquor (1 oz). Do not use any products that contain nicotine or tobacco, such as cigarettes and e-cigarettes. If you need help quitting, ask your health care provider. Summary Having a healthy lifestyle and getting preventive care can help to protect your health and wellness after age 82. Screening and testing are the best way to find a health problem early and help you avoid having a fall. Early diagnosis and treatment give you the best chance for managing medical conditions that are more common for people who are older than age 82. Falls are a major cause of broken bones and head injuries in people who are older than age 82. Take precautions to prevent a fall at home. Work with your health care provider to learn what changes you can make to improve your health and wellness and to prevent falls. This information is not intended to replace advice given to you by your health care provider. Make sure you discuss any questions you have with your health care provider. Document Revised: 01/29/2021 Document Reviewed: 11/06/2020 Elsevier Patient Education  2022 Elsevier Inc.  

## 2021-08-17 NOTE — Addendum Note (Signed)
Addended by: Ignacia Bayley on: 08/17/2021 09:31 AM   Modules accepted: Orders

## 2021-09-03 ENCOUNTER — Other Ambulatory Visit: Payer: Self-pay | Admitting: Family

## 2021-09-03 DIAGNOSIS — M1990 Unspecified osteoarthritis, unspecified site: Secondary | ICD-10-CM

## 2021-09-03 DIAGNOSIS — F331 Major depressive disorder, recurrent, moderate: Secondary | ICD-10-CM

## 2021-09-03 DIAGNOSIS — F411 Generalized anxiety disorder: Secondary | ICD-10-CM

## 2021-10-04 ENCOUNTER — Other Ambulatory Visit: Payer: Self-pay | Admitting: Family

## 2021-10-04 DIAGNOSIS — I2510 Atherosclerotic heart disease of native coronary artery without angina pectoris: Secondary | ICD-10-CM

## 2021-10-04 DIAGNOSIS — I10 Essential (primary) hypertension: Secondary | ICD-10-CM

## 2021-10-07 DIAGNOSIS — R0789 Other chest pain: Secondary | ICD-10-CM | POA: Diagnosis not present

## 2021-10-07 DIAGNOSIS — R079 Chest pain, unspecified: Secondary | ICD-10-CM | POA: Diagnosis not present

## 2021-10-07 DIAGNOSIS — I491 Atrial premature depolarization: Secondary | ICD-10-CM | POA: Diagnosis not present

## 2021-10-08 ENCOUNTER — Encounter: Payer: Self-pay | Admitting: Family Medicine

## 2021-10-08 ENCOUNTER — Ambulatory Visit (INDEPENDENT_AMBULATORY_CARE_PROVIDER_SITE_OTHER): Payer: Medicare Other | Admitting: Family Medicine

## 2021-10-08 ENCOUNTER — Emergency Department (HOSPITAL_COMMUNITY): Payer: Medicare Other

## 2021-10-08 ENCOUNTER — Other Ambulatory Visit: Payer: Self-pay

## 2021-10-08 ENCOUNTER — Emergency Department (HOSPITAL_COMMUNITY)
Admission: EM | Admit: 2021-10-08 | Discharge: 2021-10-08 | Disposition: A | Discharge status: Home / Self Care | Payer: Medicare Other | Attending: Emergency Medicine | Admitting: Emergency Medicine

## 2021-10-08 ENCOUNTER — Encounter (HOSPITAL_COMMUNITY): Payer: Self-pay

## 2021-10-08 VITALS — BP 93/48 | HR 83 | Temp 97.9°F | Ht 69.0 in | Wt 188.0 lb

## 2021-10-08 DIAGNOSIS — I10 Essential (primary) hypertension: Secondary | ICD-10-CM | POA: Diagnosis not present

## 2021-10-08 DIAGNOSIS — R079 Chest pain, unspecified: Secondary | ICD-10-CM

## 2021-10-08 DIAGNOSIS — R0789 Other chest pain: Secondary | ICD-10-CM | POA: Insufficient documentation

## 2021-10-08 DIAGNOSIS — Z7982 Long term (current) use of aspirin: Secondary | ICD-10-CM | POA: Diagnosis not present

## 2021-10-08 DIAGNOSIS — Z87891 Personal history of nicotine dependence: Secondary | ICD-10-CM | POA: Diagnosis not present

## 2021-10-08 DIAGNOSIS — J45909 Unspecified asthma, uncomplicated: Secondary | ICD-10-CM | POA: Insufficient documentation

## 2021-10-08 DIAGNOSIS — R0609 Other forms of dyspnea: Secondary | ICD-10-CM | POA: Insufficient documentation

## 2021-10-08 DIAGNOSIS — I2510 Atherosclerotic heart disease of native coronary artery without angina pectoris: Secondary | ICD-10-CM | POA: Diagnosis not present

## 2021-10-08 DIAGNOSIS — Z79899 Other long term (current) drug therapy: Secondary | ICD-10-CM | POA: Diagnosis not present

## 2021-10-08 DIAGNOSIS — R110 Nausea: Secondary | ICD-10-CM | POA: Diagnosis not present

## 2021-10-08 LAB — CBC WITH DIFFERENTIAL/PLATELET
Abs Immature Granulocytes: 0.04 10*3/uL (ref 0.00–0.07)
Basophils Absolute: 0.1 10*3/uL (ref 0.0–0.1)
Basophils Relative: 1 %
Eosinophils Absolute: 0.1 10*3/uL (ref 0.0–0.5)
Eosinophils Relative: 1 %
HCT: 38.7 % — ABNORMAL LOW (ref 39.0–52.0)
Hemoglobin: 13.3 g/dL (ref 13.0–17.0)
Immature Granulocytes: 0 %
Lymphocytes Relative: 22 %
Lymphs Abs: 2.4 10*3/uL (ref 0.7–4.0)
MCH: 32.4 pg (ref 26.0–34.0)
MCHC: 34.4 g/dL (ref 30.0–36.0)
MCV: 94.4 fL (ref 80.0–100.0)
Monocytes Absolute: 1.2 10*3/uL — ABNORMAL HIGH (ref 0.1–1.0)
Monocytes Relative: 11 %
Neutro Abs: 7.1 10*3/uL (ref 1.7–7.7)
Neutrophils Relative %: 65 %
Platelets: 284 10*3/uL (ref 150–400)
RBC: 4.1 MIL/uL — ABNORMAL LOW (ref 4.22–5.81)
RDW: 16.9 % — ABNORMAL HIGH (ref 11.5–15.5)
WBC: 11 10*3/uL — ABNORMAL HIGH (ref 4.0–10.5)
nRBC: 0 % (ref 0.0–0.2)

## 2021-10-08 LAB — COMPREHENSIVE METABOLIC PANEL
ALT: 20 U/L (ref 0–44)
AST: 24 U/L (ref 15–41)
Albumin: 4.4 g/dL (ref 3.5–5.0)
Alkaline Phosphatase: 39 U/L (ref 38–126)
Anion gap: 8 (ref 5–15)
BUN: 26 mg/dL — ABNORMAL HIGH (ref 8–23)
CO2: 22 mmol/L (ref 22–32)
Calcium: 8.7 mg/dL — ABNORMAL LOW (ref 8.9–10.3)
Chloride: 108 mmol/L (ref 98–111)
Creatinine, Ser: 1.26 mg/dL — ABNORMAL HIGH (ref 0.61–1.24)
GFR, Estimated: 57 mL/min — ABNORMAL LOW (ref >60)
Glucose, Bld: 62 mg/dL — ABNORMAL LOW (ref 70–99)
Potassium: 3.6 mmol/L (ref 3.5–5.1)
Sodium: 138 mmol/L (ref 135–145)
Total Bilirubin: 1.3 mg/dL — ABNORMAL HIGH (ref 0.3–1.2)
Total Protein: 7.3 g/dL (ref 6.5–8.1)

## 2021-10-08 LAB — TROPONIN I (HIGH SENSITIVITY)
Troponin I (High Sensitivity): 15 ng/L (ref <18)
Troponin I (High Sensitivity): 14 ng/L (ref <18)

## 2021-10-08 LAB — BRAIN NATRIURETIC PEPTIDE: B Natriuretic Peptide: 73 pg/mL (ref 0.0–100.0)

## 2021-10-08 LAB — PROTIME-INR
INR: 1.1 (ref 0.8–1.2)
Prothrombin Time: 14.7 seconds (ref 11.4–15.2)

## 2021-10-08 LAB — MAGNESIUM: Magnesium: 2.3 mg/dL (ref 1.7–2.4)

## 2021-10-08 LAB — CBG MONITORING, ED: Glucose-Capillary: 88 mg/dL (ref 70–99)

## 2021-10-08 MED ORDER — ASPIRIN 81 MG PO CHEW
324.0000 mg | CHEWABLE_TABLET | Freq: Once | ORAL | Status: AC
  Administered 2021-10-08: 324 mg via ORAL
  Filled 2021-10-08: qty 4

## 2021-10-08 NOTE — ED Triage Notes (Signed)
Pt presents to ED with complaints of mid chest pain and nausea x couple of months, pt states got worse last night took a nitro.

## 2021-10-08 NOTE — ED Provider Notes (Signed)
[facility] EMERGENCY DEPARTMENT Provider Note   CSN: [id number] Arrival date & time: 10/08/21  1235     History Chief Complaint  Patient presents with   Chest Pain    Casey Klein is a 80 y.o. male.  HPI Presents from primary care due to episodic chest pain for the past few weeks.  He is here with a family member who assists with the history. He has a history of CAD, but has no stents, nor CABG history.  He was taking aspirin until about 2 weeks ago, however. Now, he has recently developed chest pain, nausea, inconsistent, not currently present.  Symptoms may be worse with exertion, though this is not entirely clear.  No complete syncope, no vomiting, no diarrhea, no fever.  He states that he is taking his other medication aside from aspirin regularly. Yesterday he had a profound event, went to the fire department, had evaluation, declined hospital transport.  Today in following up from that episode he went to primary care, and was sent here for additional studies.    Past Medical History:  Diagnosis Date   Allergy    Asthma    CAD (coronary artery disease)    Nonobstructive   Carotid stenosis    50% bilateral 2012   Cataract    History of kidney stones    HTN (hypertension)    PAT (paroxysmal atrial tachycardia) (HCC)     Patient Active Problem List   Diagnosis Date Noted   Overweight (BMI 25.0-29.9) 05/15/2018   Unstable angina (HCC) 07/12/2017   Hyperlipidemia 07/12/2017   Bilateral carotid artery stenosis 07/12/2017   Vitamin B 12 deficiency 01/28/2016   Risk for falls 08/14/2015   GAD (generalized anxiety disorder) 07/27/2015   Vitamin D deficiency 07/27/2015   Arthritis 07/27/2015   Depression 01/26/2015   CAD (coronary artery disease)    CAROTID STENOSIS 01/07/2011   ESSENTIAL HYPERTENSION, BENIGN 02/08/2009   Asthma 02/08/2009    Past Surgical History:  Procedure Laterality Date   BACK SURGERY     lumbar disc   CATARACT EXTRACTION W/PHACO Right  05/06/2013   Procedure: CATARACT EXTRACTION PHACO AND INTRAOCULAR LENS PLACEMENT (IOC);  Surgeon: Susa Simmonds, MD;  Location: [facility];  Service: Ophthalmology;  Laterality: Right;  CDE: 19.84   CATARACT EXTRACTION W/PHACO Left 06/24/2013   Procedure: CATARACT EXTRACTION PHACO AND INTRAOCULAR LENS PLACEMENT (IOC);  Surgeon: Susa Simmonds, MD;  Location: [facility];  Service: Ophthalmology;  Laterality: Left;  CDE:  18.21   CERVICAL DISC SURGERY     EYE SURGERY         No family history on file.  Social History   Tobacco Use   Smoking status: Former    Packs/day: 1.00    Years: 10.00    Pack years: 10.00    Types: Cigarettes    Start date: 02/07/1983   Smokeless tobacco: Never  Vaping Use   Vaping Use: Never used  Substance Use Topics   Alcohol use: No   Drug use: No    Home Medications Prior to Admission medications   Medication Sig Start Date End Date Taking? Authorizing Provider  albuterol (VENTOLIN HFA) 108 (90 Base) MCG/ACT inhaler Inhale 2 puffs into the lungs every 6 (six) hours as needed for wheezing or shortness of breath. 05/14/21   Jannifer Rodney A, FNP  amLODipine (NORVASC) 10 MG tablet TAKE 1 TABLET BY MOUTH EVERY DAY 08/05/21   Jannifer Rodney A, FNP  aspirin EC 81 MG tablet  Take 1 tablet (81 mg total) by mouth daily. Patient not taking: Reported on 10/08/2021 02/12/16   Jannifer Rodney A, FNP  atorvastatin (LIPITOR) 10 MG tablet TAKE 1 TABLET BY MOUTH EVERY DAY 08/05/21   Junie Spencer, FNP  budesonide-formoterol (SYMBICORT) 80-4.5 MCG/ACT inhaler TAKE 2 PUFFS BY MOUTH TWICE A DAY 05/18/20   Jannifer Rodney A, FNP  citalopram (CELEXA) 40 MG tablet TAKE 1 TABLET BY MOUTH EVERY DAY 09/03/21   Jannifer Rodney A, FNP  lisinopril (ZESTRIL) 10 MG tablet TAKE 1 TABLET BY MOUTH EVERY DAY 10/04/21   Jannifer Rodney A, FNP  meloxicam (MOBIC) 15 MG tablet TAKE 1 TABLET BY MOUTH EVERY DAY 09/03/21   Jannifer Rodney A, FNP  montelukast (SINGULAIR) 10 MG tablet TAKE 1 TABLET BY MOUTH  EVERYDAY AT BEDTIME 03/16/21   Hawks, Neysa Bonito A, FNP  Vitamin D, Ergocalciferol, (DRISDOL) 1.25 MG (50000 UNIT) CAPS capsule Take 1 capsule (50,000 Units total) by mouth every 7 (seven) days. 02/20/20   Junie Spencer, FNP    Allergies    Pneumococcal vaccines and Penicillins  Review of Systems   Review of Systems  Constitutional:        Per HPI, otherwise negative  HENT:         Per HPI, otherwise negative  Respiratory:         Per HPI, otherwise negative  Cardiovascular:        Per HPI, otherwise negative  Gastrointestinal:  Negative for vomiting.  Endocrine:       Negative aside from HPI  Genitourinary:        Neg aside from HPI   Musculoskeletal:        Per HPI, otherwise negative  Skin: Negative.   Neurological:  Negative for syncope.   Physical Exam Updated Vital Signs BP 140/84   Pulse 86   Temp 97.8 F (36.6 C) (Oral)   Resp (!) 23   Ht 5\' 9"  (1.753 m)   Wt 85.3 kg   SpO2 97%   BMI 27.76 kg/m   Physical Exam Vitals and nursing note reviewed.  Constitutional:      General: He is not in acute distress.    Appearance: He is well-developed.  HENT:     Head: Normocephalic and atraumatic.  Eyes:     Conjunctiva/sclera: Conjunctivae normal.  Cardiovascular:     Rate and Rhythm: Normal rate and regular rhythm.  Pulmonary:     Effort: Pulmonary effort is normal. No respiratory distress.     Breath sounds: No stridor.  Abdominal:     General: There is no distension.  Skin:    General: Skin is warm and dry.  Neurological:     Mental Status: He is alert and oriented to person, place, and time.    ED Results / Procedures / Treatments   Labs (all labs ordered are listed, but only abnormal results are displayed) Labs Reviewed  COMPREHENSIVE METABOLIC PANEL - Abnormal; Notable for the following components:      Result Value   Glucose, Bld 62 (*)    BUN 26 (*)    Creatinine, Ser 1.26 (*)    Calcium 8.7 (*)    Total Bilirubin 1.3 (*)    GFR, Estimated 57  (*)    All other components within normal limits  CBC WITH DIFFERENTIAL/PLATELET - Abnormal; Notable for the following components:   WBC 11.0 (*)    RBC 4.10 (*)    HCT 38.7 (*)    RDW  16.9 (*)    Monocytes Absolute 1.2 (*)    All other components within normal limits  MAGNESIUM  BRAIN NATRIURETIC PEPTIDE  PROTIME-INR  CBG MONITORING, ED  TROPONIN I (HIGH SENSITIVITY)  TROPONIN I (HIGH SENSITIVITY)    EKG EKG Interpretation  Date/Time:  Friday October 08 2021 12:43:29 EDT Ventricular Rate:  77 PR Interval:    QRS Duration: 88 QT Interval:  377 QTC Calculation: 427 R Axis:   73 Text Interpretation: Sinus rhythm Ventricular premature complex Artifact Abnormal ECG Confirmed by Gerhard Munch [phone]) on 10/08/2021 4:09:14 PM  Radiology DG Chest Portable 1 View  Result Date: 10/08/2021 CLINICAL DATA:  An 80 year old male presents with mid chest pain with recent worsening. EXAM: PORTABLE CHEST 1 VIEW COMPARISON:  October 25, 2016. FINDINGS: EKG leads project over the chest. Incidental note again made of azygous fissure in the RIGHT upper lobe. Cardiomediastinal contours and hilar structures are normal. No effusion. No consolidative process. No pneumothorax. On limited assessment there is no acute skeletal process. IMPRESSION: No acute cardiopulmonary disease. Electronically Signed   By: Donzetta Kohut M.D.   On: 10/08/2021 13:17    Procedures Procedures   Medications Ordered in ED Medications  aspirin chewable tablet 324 mg (324 mg Oral Given 10/08/21 1341)   Chart review notable for nonobstructive coronary disease. ED Course  I have reviewed the triage vital signs and the nursing notes.  Pertinent labs & imaging results that were available during my care of the patient were reviewed by me and considered in my medical decision making (see chart for details).   4:07 PM In no distress, no ongoing complaints.  Have reviewed his labs, now after hours of monitoring he has had no  chest pain, no complaints, 2 normal troponin, nonischemic EKG, the context of fatigue, is reassuring for low suspicion ACS.  Patient has no known obstructive cardiac disease.  He has recently stopped taking his aspirin, was encouraged to resume taking this, is comfortable with following up with his cardiologist in the coming days as an outpatient.  We discussed return precautions, follow-up instructions as well. MDM Rules/Calculators/A&P MDM Number of Diagnoses or Management Options Atypical chest pain: new, needed workup   Amount and/or Complexity of Data Reviewed Clinical lab tests: ordered and reviewed Tests in the radiology section of CPT: ordered and reviewed Tests in the medicine section of CPT: reviewed and ordered Decide to obtain previous medical records or to obtain history from someone other than the patient: yes Obtain history from someone other than the patient: yes Review and summarize past medical records: yes Independent visualization of images, tracings, or specimens: yes  Risk of Complications, Morbidity, and/or Mortality Presenting problems: high Diagnostic procedures: high Management options: high  Critical Care Total time providing critical care: < 30 minutes  Patient Progress Patient progress: stable   Final Clinical Impression(s) / ED Diagnoses Final diagnoses:  Atypical chest pain     Gerhard Munch, MD 10/08/21 1609

## 2021-10-08 NOTE — Progress Notes (Signed)
Acute Office Visit  Subjective:    Patient ID: Casey Klein, male    DOB: [birth year], 80 y.o.   MRN: [id number]  Chief Complaint  Patient presents with  . Chest Pain    HPI Patient is in today for chest pain x 1 week. The pain is in the center of his chest and radiates to both sides. It has been intermittent. It radiates to both sides of his chest. The pain feels like "somebody hit me". The pain is worse with activity or improves with rest. He has difficulty describing how long it lasts for. He has had been feeling short of breath with activity, fatigue, decreased appetite, and nauseous for the last couple days. The pain was worse last night. He took one of his girlfriend's nitroglycerin last night and this resolved his pain. He went to the fire department after this. They did a EKG that was normal. His blood sugar was 644 and his systolic BP was 034. He was told to follow up with his PCP. He denies pain that radiates to his left arm or jaw, changes in speech, confusion, changes in vision, numbness, tingling, vomiting, edema, or dizziness. He has a history of CAD, HTN, and bilateral carotid stenosis.      Past Medical History:  Diagnosis Date  . Allergy   . Asthma   . CAD (coronary artery disease)    Nonobstructive  . Carotid stenosis    50% bilateral 2012  . Cataract   . History of kidney stones   . HTN (hypertension)   . PAT (paroxysmal atrial tachycardia) ([place])     Past Surgical History:  Procedure Laterality Date  . BACK SURGERY     lumbar disc  . CATARACT EXTRACTION W/PHACO Right 05/06/2013   Procedure: CATARACT EXTRACTION PHACO AND INTRAOCULAR LENS PLACEMENT (IOC);  Surgeon: Williams Che, MD;  Location: [facility];  Service: Ophthalmology;  Laterality: Right;  CDE: 19.84  . CATARACT EXTRACTION W/PHACO Left 06/24/2013   Procedure: CATARACT EXTRACTION PHACO AND INTRAOCULAR LENS PLACEMENT (IOC);  Surgeon: Williams Che, MD;  Location: [facility];  Service: Ophthalmology;   Laterality: Left;  CDE:  18.21  . CERVICAL DISC SURGERY    . EYE SURGERY      No family history on file.  Social History   Socioeconomic History  . Marital status: Widowed    Spouse name: Not on file  . Number of children: 3  . Years of education: Not on file  . Highest education level: Not on file  Occupational History  . Not on file  Tobacco Use  . Smoking status: Former    Packs/day: 1.00    Years: 10.00    Pack years: 10.00    Types: Cigarettes    Start date: 02/07/1983  . Smokeless tobacco: Never  Vaping Use  . Vaping Use: Never used  Substance and Sexual Activity  . Alcohol use: No  . Drug use: No  . Sexual activity: Never    Birth control/protection: None  Other Topics Concern  . Not on file  Social History Narrative   Lives alone   Social Determinants of Health   Financial Resource Strain: Low Risk   . Difficulty of Paying Living Expenses: Not very hard  Food Insecurity: No Food Insecurity  . Worried About Charity fundraiser in the Last Year: Never true  . Ran Out of Food in the Last Year: Never true  Transportation Needs: No Transportation Needs  . Lack  of Transportation (Medical): No  . Lack of Transportation (Non-Medical): No  Physical Activity: Inactive  . Days of Exercise per Week: 0 days  . Minutes of Exercise per Session: 0 min  Stress: No Stress Concern Present  . Feeling of Stress : Only a little  Social Connections: Moderately Isolated  . Frequency of Communication with Friends and Family: Once a week  . Frequency of Social Gatherings with Friends and Family: Once a week  . Attends Religious Services: More than 4 times per year  . Active Member of Clubs or Organizations: Yes  . Attends Archivist Meetings: More than 4 times per year  . Marital Status: Widowed  Intimate Partner Violence: Not on file    Outpatient Medications Prior to Visit  Medication Sig Dispense Refill  . albuterol (VENTOLIN HFA) 108 (90 Base) MCG/ACT inhaler  Inhale 2 puffs into the lungs every 6 (six) hours as needed for wheezing or shortness of breath. 8 g 0  . amLODipine (NORVASC) 10 MG tablet TAKE 1 TABLET BY MOUTH EVERY DAY 90 tablet 0  . atorvastatin (LIPITOR) 10 MG tablet TAKE 1 TABLET BY MOUTH EVERY DAY 90 tablet 0  . budesonide-formoterol (SYMBICORT) 80-4.5 MCG/ACT inhaler TAKE 2 PUFFS BY MOUTH TWICE A DAY 30.6 Inhaler 0  . citalopram (CELEXA) 40 MG tablet TAKE 1 TABLET BY MOUTH EVERY DAY 90 tablet 0  . lisinopril (ZESTRIL) 10 MG tablet TAKE 1 TABLET BY MOUTH EVERY DAY 90 tablet 0  . meloxicam (MOBIC) 15 MG tablet TAKE 1 TABLET BY MOUTH EVERY DAY 90 tablet 0  . montelukast (SINGULAIR) 10 MG tablet TAKE 1 TABLET BY MOUTH EVERYDAY AT BEDTIME 90 tablet 2  . Vitamin D, Ergocalciferol, (DRISDOL) 1.25 MG (50000 UNIT) CAPS capsule Take 1 capsule (50,000 Units total) by mouth every 7 (seven) days. 12 capsule 3  . aspirin EC 81 MG tablet Take 1 tablet (81 mg total) by mouth daily. (Patient not taking: Reported on 10/08/2021) 90 tablet 1  . predniSONE (STERAPRED UNI-PAK 21 TAB) 10 MG (21) TBPK tablet Use as directed 21 tablet 0   No facility-administered medications prior to visit.    Allergies  Allergen Reactions  . Pneumococcal Vaccines     Pt states had pneumococcal vaccine at [facility] on 09-06-13 and arm swelled "huge with a lot swelling"   . Penicillins Other (See Comments)    unknown    Review of Systems As per HPI.     Objective:    Physical Exam Vitals and nursing note reviewed.  Constitutional:      General: He is not in acute distress.    Appearance: He is not ill-appearing, toxic-appearing or diaphoretic.  HENT:     Head: Normocephalic and atraumatic.  Eyes:     Extraocular Movements: Extraocular movements intact.     Pupils: Pupils are equal, round, and reactive to light.  Neck:     Vascular: No JVD.  Cardiovascular:     Rate and Rhythm: Normal rate and regular rhythm. No extrasystoles are present.    Chest Wall: PMI is not  displaced.     Heart sounds: Normal heart sounds. No murmur heard. Pulmonary:     Effort: Pulmonary effort is normal.     Breath sounds: Normal breath sounds.  Chest:     Chest wall: No tenderness or edema.  Abdominal:     General: Bowel sounds are increased.     Palpations: Abdomen is soft.     Tenderness: There is no  abdominal tenderness.  Neurological:     Mental Status: He is alert and oriented to person, place, and time.  Psychiatric:        Mood and Affect: Mood normal.        Behavior: Behavior normal.    BP (!) 93/48   Pulse 83   Temp 97.9 F (36.6 C) (Temporal)   Ht 5' 9"  (1.753 m)   Wt 188 lb (85.3 kg)   BMI 27.76 kg/m  Wt Readings from Last 3 Encounters:  10/08/21 188 lb (85.3 kg)  08/17/21 197 lb 12.8 oz (89.7 kg)  05/14/21 203 lb 3.2 oz (92.2 kg)    Health Maintenance Due  Topic Date Due  . COVID-19 Vaccine (4 - Booster for Moderna series) 01/19/2021  . Pneumonia Vaccine 40+ Years old (2 - PPSV23 if available, else PCV20) 09/14/2021    There are no preventive care reminders to display for this patient.   Lab Results  Component Value Date   TSH 2.010 02/20/2020   Lab Results  Component Value Date   WBC 11.7 (H) 05/14/2021   HGB 13.5 05/14/2021   HCT 40.0 05/14/2021   MCV 91 05/14/2021   PLT 267 05/14/2021   Lab Results  Component Value Date   NA 142 08/17/2021   K 4.4 08/17/2021   CO2 22 08/17/2021   GLUCOSE 79 08/17/2021   BUN 14 08/17/2021   CREATININE 1.20 08/17/2021   BILITOT 0.9 04/13/2021   ALKPHOS 57 04/13/2021   AST 18 04/13/2021   ALT 19 04/13/2021   PROT 6.9 04/13/2021   ALBUMIN 4.5 04/13/2021   CALCIUM 9.1 08/17/2021   EGFR 61 08/17/2021   Lab Results  Component Value Date   CHOL 151 04/13/2021   Lab Results  Component Value Date   HDL 46 04/13/2021   Lab Results  Component Value Date   LDLCALC 80 04/13/2021   Lab Results  Component Value Date   TRIG 144 04/13/2021   Lab Results  Component Value Date    CHOLHDL 3.3 04/13/2021   Lab Results  Component Value Date   HGBA1C  01/15/2009    5.9 (NOTE)   The ADA recommends the following therapeutic goal for glycemic   control related to Hgb A1C measurement:   Goal of Therapy:   < 7.0% Hgb A1C   Reference: American Diabetes Association: Clinical Practice   Recommendations 2008, Diabetes Care,  2008, 31:(Suppl 1).       Assessment & Plan:   Obryan was seen today for chest pain.  Diagnoses and all orders for this visit:  Chest pain, unspecified type EKG done today unchanged from previous EKG. Discussed symptoms along with his age and history of CAD, carotid stenosis, and unstable angina is concerning for ACS and warrants work up in the ER. Declined EMS transport. His girlfriend will drive him to [facility] today after this visit.  -     EKG 12-Lead  The patient indicates understanding of these issues and agrees with the plan.  Gwenlyn Perking, FNP

## 2021-10-08 NOTE — Discharge Instructions (Signed)
As discussed, your evaluation today has been largely reassuring.  But, it is important that you monitor your condition carefully, and do not hesitate to return to the ED if you develop new, or concerning changes in your condition. ? ?Otherwise, please follow-up with your physician for appropriate ongoing care. ? ?

## 2021-11-03 ENCOUNTER — Other Ambulatory Visit: Payer: Self-pay | Admitting: Family

## 2021-11-03 DIAGNOSIS — E785 Hyperlipidemia, unspecified: Secondary | ICD-10-CM

## 2021-11-03 DIAGNOSIS — I10 Essential (primary) hypertension: Secondary | ICD-10-CM

## 2021-11-03 DIAGNOSIS — I2510 Atherosclerotic heart disease of native coronary artery without angina pectoris: Secondary | ICD-10-CM

## 2021-11-03 DIAGNOSIS — E8881 Metabolic syndrome: Secondary | ICD-10-CM

## 2021-12-06 ENCOUNTER — Other Ambulatory Visit: Payer: Self-pay | Admitting: Family

## 2021-12-06 DIAGNOSIS — M1990 Unspecified osteoarthritis, unspecified site: Secondary | ICD-10-CM

## 2021-12-06 DIAGNOSIS — F411 Generalized anxiety disorder: Secondary | ICD-10-CM

## 2021-12-06 DIAGNOSIS — F331 Major depressive disorder, recurrent, moderate: Secondary | ICD-10-CM

## 2021-12-20 ENCOUNTER — Ambulatory Visit: Payer: Medicare Other | Admitting: Family

## 2021-12-21 ENCOUNTER — Encounter: Payer: Self-pay | Admitting: Family

## 2021-12-27 ENCOUNTER — Encounter: Payer: Self-pay | Admitting: Family

## 2021-12-27 ENCOUNTER — Ambulatory Visit (INDEPENDENT_AMBULATORY_CARE_PROVIDER_SITE_OTHER): Payer: Medicare Other | Admitting: Family

## 2021-12-27 ENCOUNTER — Other Ambulatory Visit: Payer: Self-pay

## 2021-12-27 VITALS — BP 108/64 | HR 73 | Temp 98.4°F | Ht 69.0 in | Wt 192.0 lb

## 2021-12-27 DIAGNOSIS — E785 Hyperlipidemia, unspecified: Secondary | ICD-10-CM | POA: Diagnosis not present

## 2021-12-27 DIAGNOSIS — E663 Overweight: Secondary | ICD-10-CM

## 2021-12-27 DIAGNOSIS — F411 Generalized anxiety disorder: Secondary | ICD-10-CM

## 2021-12-27 DIAGNOSIS — J4520 Mild intermittent asthma, uncomplicated: Secondary | ICD-10-CM

## 2021-12-27 DIAGNOSIS — I2510 Atherosclerotic heart disease of native coronary artery without angina pectoris: Secondary | ICD-10-CM | POA: Diagnosis not present

## 2021-12-27 DIAGNOSIS — E538 Deficiency of other specified B group vitamins: Secondary | ICD-10-CM | POA: Diagnosis not present

## 2021-12-27 DIAGNOSIS — I10 Essential (primary) hypertension: Secondary | ICD-10-CM | POA: Diagnosis not present

## 2021-12-27 DIAGNOSIS — E559 Vitamin D deficiency, unspecified: Secondary | ICD-10-CM

## 2021-12-27 DIAGNOSIS — M1990 Unspecified osteoarthritis, unspecified site: Secondary | ICD-10-CM | POA: Diagnosis not present

## 2021-12-27 DIAGNOSIS — F331 Major depressive disorder, recurrent, moderate: Secondary | ICD-10-CM

## 2021-12-27 LAB — CBC WITH DIFFERENTIAL/PLATELET
Basophils Absolute: 0.1 10*3/uL (ref 0.0–0.2)
Basos: 1 %
EOS (ABSOLUTE): 0.2 10*3/uL (ref 0.0–0.4)
Eos: 2 %
Hematocrit: 40 % (ref 37.5–51.0)
Hemoglobin: 13.2 g/dL (ref 13.0–17.7)
Immature Grans (Abs): 0.1 10*3/uL (ref 0.0–0.1)
Immature Granulocytes: 1 %
Lymphocytes Absolute: 1.8 10*3/uL (ref 0.7–3.1)
Lymphs: 18 %
MCH: 31.1 pg (ref 26.6–33.0)
MCHC: 33 g/dL (ref 31.5–35.7)
MCV: 94 fL (ref 79–97)
Monocytes Absolute: 1 10*3/uL — ABNORMAL HIGH (ref 0.1–0.9)
Monocytes: 10 %
Neutrophils Absolute: 6.9 10*3/uL (ref 1.4–7.0)
Neutrophils: 68 %
Platelets: 260 10*3/uL (ref 150–450)
RBC: 4.24 x10E6/uL (ref 4.14–5.80)
RDW: 16.9 % — ABNORMAL HIGH (ref 11.6–15.4)
WBC: 10 10*3/uL (ref 3.4–10.8)

## 2021-12-27 LAB — CMP14+EGFR
ALT: 12 IU/L (ref 0–44)
AST: 12 IU/L (ref 0–40)
Albumin/Globulin Ratio: 2.4 — ABNORMAL HIGH (ref 1.2–2.2)
Albumin: 4.6 g/dL (ref 3.6–4.6)
Alkaline Phosphatase: 49 IU/L (ref 44–121)
BUN/Creatinine Ratio: 17 (ref 10–24)
BUN: 19 mg/dL (ref 8–27)
Bilirubin Total: 0.7 mg/dL (ref 0.0–1.2)
CO2: 23 mmol/L (ref 20–29)
Calcium: 9 mg/dL (ref 8.6–10.2)
Chloride: 106 mmol/L (ref 96–106)
Creatinine, Ser: 1.15 mg/dL (ref 0.76–1.27)
Globulin, Total: 1.9 g/dL (ref 1.5–4.5)
Glucose: 85 mg/dL (ref 70–99)
Potassium: 4.4 mmol/L (ref 3.5–5.2)
Sodium: 141 mmol/L (ref 134–144)
Total Protein: 6.5 g/dL (ref 6.0–8.5)
eGFR: 64 mL/min/{1.73_m2} (ref >59)

## 2021-12-27 MED ORDER — VITAMIN D (ERGOCALCIFEROL) 1.25 MG (50000 UNIT) PO CAPS: 50000.0000 [IU] | ORAL_CAPSULE | ORAL | 3 refills | Status: DC

## 2021-12-27 NOTE — Progress Notes (Signed)
Subjective:    Patient ID: Casey Klein, male    DOB: 17-Feb-1941, 81 y.o.   MRN: [id number]  Chief Complaint  Patient presents with   Medical Management of Chronic Issues   Pt presents to the office today for chronic follow up. Hypertension This is a chronic problem. The current episode started more than 1 year ago. The problem has been resolved since onset. The problem is controlled. Associated symptoms include anxiety and malaise/fatigue. Pertinent negatives include no peripheral edema or shortness of breath. Risk factors for coronary artery disease include dyslipidemia, obesity and male gender. The current treatment provides moderate improvement.  Asthma There is no chest tightness, cough, shortness of breath or wheezing. This is a chronic problem. The current episode started more than 1 year ago. The problem occurs intermittently. Associated symptoms include malaise/fatigue. He reports moderate improvement on treatment. His past medical history is significant for asthma.  Arthritis Presents for follow-up visit. He complains of pain and stiffness. The symptoms have been stable. Affected locations include the right shoulder and left shoulder. His pain is at a severity of 5/10.  Hyperlipidemia This is a chronic problem. The current episode started more than 1 year ago. The problem is controlled. Recent lipid tests were reviewed and are normal. Exacerbating diseases include obesity. Pertinent negatives include no shortness of breath. The current treatment provides moderate improvement of lipids. Risk factors for coronary artery disease include dyslipidemia, diabetes mellitus, hypertension, male sex and a sedentary lifestyle.  Anxiety Presents for follow-up visit. Symptoms include depressed mood, excessive worry, irritability and nervous/anxious behavior. Patient reports no shortness of breath. Symptoms occur most days. The severity of symptoms is moderate.   His past medical history is  significant for asthma.  Depression        This is a chronic problem.  The current episode started more than 1 year ago.   Associated symptoms include no helplessness, no hopelessness, not irritable and not sad.  Past treatments include SSRIs - Selective serotonin reuptake inhibitors.  Compliance with treatment is good.  Past medical history includes anxiety.      Review of Systems  Constitutional:  Positive for irritability and malaise/fatigue.  Respiratory:  Negative for cough, shortness of breath and wheezing.   Musculoskeletal:  Positive for arthritis and stiffness.  Psychiatric/Behavioral:  Positive for depression. The patient is nervous/anxious.   All other systems reviewed and are negative.     Objective:   Physical Exam Vitals reviewed.  Constitutional:      General: He is not irritable.He is not in acute distress.    Appearance: He is well-developed.  HENT:     Head: Normocephalic.     Right Ear: Tympanic membrane normal.     Left Ear: Tympanic membrane normal.  Eyes:     General:        Right eye: No discharge.        Left eye: No discharge.     Pupils: Pupils are equal, round, and reactive to light.  Neck:     Thyroid: No thyromegaly.  Cardiovascular:     Rate and Rhythm: Normal rate and regular rhythm.     Heart sounds: Normal heart sounds. No murmur heard. Pulmonary:     Effort: Pulmonary effort is normal. No respiratory distress.     Breath sounds: Normal breath sounds. No wheezing.  Abdominal:     General: Bowel sounds are normal. There is no distension.     Palpations: Abdomen is soft.  Tenderness: There is no abdominal tenderness.  Musculoskeletal:        General: No tenderness. Normal range of motion.     Cervical back: Normal range of motion and neck supple.  Skin:    General: Skin is warm and dry.     Findings: No erythema or rash.  Neurological:     Mental Status: He is alert and oriented to person, place, and time.     Cranial Nerves: No  cranial nerve deficit.     Deep Tendon Reflexes: Reflexes are normal and symmetric.  Psychiatric:        Behavior: Behavior normal.        Thought Content: Thought content normal.        Judgment: Judgment normal.      BP 108/64    Pulse 73    Temp 98.4 F (36.9 C) (Temporal)    Ht 5' 9"  (1.753 m)    Wt 192 lb (87.1 kg)    SpO2 95%    BMI 28.35 kg/m      Assessment & Plan:  ERCEL PEPITONE comes in today with chief complaint of Medical Management of Chronic Issues   Diagnosis and orders addressed:  1. Vitamin D deficiency - Vitamin D, Ergocalciferol, (DRISDOL) 1.25 MG (50000 UNIT) CAPS capsule; Take 1 capsule (50,000 Units total) by mouth every 7 (seven) days.  Dispense: 12 capsule; Refill: 3 - CMP14+EGFR - CBC with Differential/Platelet  2. Vitamin B 12 deficiency - CMP14+EGFR - CBC with Differential/Platelet  3. Overweight (BMI 25.0-29.9) - CMP14+EGFR - CBC with Differential/Platelet  4. Hyperlipidemia, unspecified hyperlipidemia type - CMP14+EGFR - CBC with Differential/Platelet  5. GAD (generalized anxiety disorder) - CMP14+EGFR - CBC with Differential/Platelet  6. Moderate episode of recurrent major depressive disorder (HCC) - CMP14+EGFR - CBC with Differential/Platelet  7. Arthritis - CMP14+EGFR - CBC with Differential/Platelet  8. Mild intermittent asthma without complication - BTD17+OHYW - CBC with Differential/Platelet  9. Essential hypertension, benign - CMP14+EGFR - CBC with Differential/Platelet  10. Coronary artery disease involving native coronary artery of native heart without angina pectoris - CMP14+EGFR - CBC with Differential/Platelet   Labs pending Health Maintenance reviewed Diet and exercise encouraged  Follow up plan: 3 months    Evelina Dun, FNP

## 2021-12-27 NOTE — Patient Instructions (Signed)
Health Maintenance After Age 83 After age 83, you are at a higher risk for certain long-term diseases and infections as well as injuries from falls. Falls are a major cause of broken bones and head injuries in people who are older than age 83. Getting regular preventive care can help to keep you healthy and well. Preventive care includes getting regular testing and making lifestyle changes as recommended by your health care provider. Talk with your health care provider about: Which screenings and tests you should have. A screening is a test that checks for a disease when you have no symptoms. A diet and exercise plan that is right for you. What should I know about screenings and tests to prevent falls? Screening and testing are the best ways to find a health problem early. Early diagnosis and treatment give you the best chance of managing medical conditions that are common after age 83. Certain conditions and lifestyle choices may make you more likely to have a fall. Your health care provider may recommend: Regular vision checks. Poor vision and conditions such as cataracts can make you more likely to have a fall. If you wear glasses, make sure to get your prescription updated if your vision changes. Medicine review. Work with your health care provider to regularly review all of the medicines you are taking, including over-the-counter medicines. Ask your health care provider about any side effects that may make you more likely to have a fall. Tell your health care provider if any medicines that you take make you feel dizzy or sleepy. Strength and balance checks. Your health care provider may recommend certain tests to check your strength and balance while standing, walking, or changing positions. Foot health exam. Foot pain and numbness, as well as not wearing proper footwear, can make you more likely to have a fall. Screenings, including: Osteoporosis screening. Osteoporosis is a condition that causes  the bones to get weaker and break more easily. Blood pressure screening. Blood pressure changes and medicines to control blood pressure can make you feel dizzy. Depression screening. You may be more likely to have a fall if you have a fear of falling, feel depressed, or feel unable to do activities that you used to do. Alcohol use screening. Using too much alcohol can affect your balance and may make you more likely to have a fall. Follow these instructions at home: Lifestyle Do not drink alcohol if: Your health care provider tells you not to drink. If you drink alcohol: Limit how much you have to: 0-1 drink a day for women. 0-2 drinks a day for men. Know how much alcohol is in your drink. In the [place] one drink equals one 12 oz bottle of beer (355 mL), one 5 oz glass of wine (148 mL), or one 1 oz glass of hard liquor (44 mL). Do not use any products that contain nicotine or tobacco. These products include cigarettes, chewing tobacco, and vaping devices, such as e-cigarettes. If you need help quitting, ask your health care provider. Activity  Follow a regular exercise program to stay fit. This will help you maintain your balance. Ask your health care provider what types of exercise are appropriate for you. If you need a cane or walker, use it as recommended by your health care provider. Wear supportive shoes that have nonskid soles. Safety  Remove any tripping hazards, such as rugs, cords, and clutter. Install safety equipment such as grab bars in bathrooms and safety rails on stairs. Keep rooms and walkways   well-lit. General instructions Talk with your health care provider about your risks for falling. Tell your health care provider if: You fall. Be sure to tell your health care provider about all falls, even ones that seem minor. You feel dizzy, tiredness (fatigue), or off-balance. Take over-the-counter and prescription medicines only as told by your health care provider. These include  supplements. Eat a healthy diet and maintain a healthy weight. A healthy diet includes low-fat dairy products, low-fat (lean) meats, and fiber from whole grains, beans, and lots of fruits and vegetables. Stay current with your vaccines. Schedule regular health, dental, and eye exams. Summary Having a healthy lifestyle and getting preventive care can help to protect your health and wellness after age 83. Screening and testing are the best way to find a health problem early and help you avoid having a fall. Early diagnosis and treatment give you the best chance for managing medical conditions that are more common for people who are older than age 83. Falls are a major cause of broken bones and head injuries in people who are older than age 83. Take precautions to prevent a fall at home. Work with your health care provider to learn what changes you can make to improve your health and wellness and to prevent falls. This information is not intended to replace advice given to you by your health care provider. Make sure you discuss any questions you have with your health care provider. Document Revised: 04/12/2021 Document Reviewed: 04/12/2021 Elsevier Patient Education  2022 [employer] Inc[employer]  

## 2022-01-02 ENCOUNTER — Other Ambulatory Visit: Payer: Self-pay | Admitting: Family

## 2022-01-02 DIAGNOSIS — I10 Essential (primary) hypertension: Secondary | ICD-10-CM

## 2022-01-02 DIAGNOSIS — I2510 Atherosclerotic heart disease of native coronary artery without angina pectoris: Secondary | ICD-10-CM

## 2022-03-09 ENCOUNTER — Other Ambulatory Visit: Payer: Self-pay | Admitting: Family

## 2022-03-09 DIAGNOSIS — F411 Generalized anxiety disorder: Secondary | ICD-10-CM

## 2022-03-09 DIAGNOSIS — M1990 Unspecified osteoarthritis, unspecified site: Secondary | ICD-10-CM

## 2022-03-09 DIAGNOSIS — F331 Major depressive disorder, recurrent, moderate: Secondary | ICD-10-CM

## 2022-03-11 ENCOUNTER — Other Ambulatory Visit: Payer: Self-pay | Admitting: Family

## 2022-03-11 DIAGNOSIS — E8881 Metabolic syndrome: Secondary | ICD-10-CM

## 2022-03-11 DIAGNOSIS — E785 Hyperlipidemia, unspecified: Secondary | ICD-10-CM

## 2022-03-11 DIAGNOSIS — I2510 Atherosclerotic heart disease of native coronary artery without angina pectoris: Secondary | ICD-10-CM

## 2022-03-18 ENCOUNTER — Other Ambulatory Visit: Payer: Self-pay | Admitting: Family

## 2022-03-18 DIAGNOSIS — I10 Essential (primary) hypertension: Secondary | ICD-10-CM

## 2022-03-24 ENCOUNTER — Ambulatory Visit (INDEPENDENT_AMBULATORY_CARE_PROVIDER_SITE_OTHER): Payer: Medicare Other

## 2022-03-24 VITALS — Wt 192.0 lb

## 2022-03-24 DIAGNOSIS — Z0000 Encounter for general adult medical examination without abnormal findings: Secondary | ICD-10-CM | POA: Diagnosis not present

## 2022-03-24 NOTE — Patient Instructions (Signed)
Mr. Montalto , ?Thank you for taking time to come for your Medicare Wellness Visit. I appreciate your ongoing commitment to your health goals. Please review the following plan we discussed and let me know if I can assist you in the future.  ? ?Screening recommendations/referrals: ?Colonoscopy: No longer required ?Recommended yearly ophthalmology/optometry visit for glaucoma screening and checkup ?Recommended yearly dental visit for hygiene and checkup ? ?Vaccinations: ?Influenza vaccine: Done 12/09/2020 - Repeat annually ?Pneumococcal vaccine: Done 09/06/2013 & 09/14/2020 ?Tdap vaccine: Due - recommended every 10 years * ?Shingles vaccine: Done 08/17/2021 - patient declines second dose*   ?Covid-19: Done 5/17/221, 05/18/2020, & 11/24/2020 ? ?Advanced directives: Advance directive discussed with you today. Even though you declined this today, please call our office should you change your mind, and we can give you the proper paperwork for you to fill out.  ? ?Conditions/risks identified: Aim for 30 minutes of exercise or brisk walking, 6-8 glasses of water, and 5 servings of fruits and vegetables each day.  ? ?Next appointment: Follow up in one year for your annual wellness visit.  ? ?Preventive Care 62 Years and Older, Male ? ?Preventive care refers to lifestyle choices and visits with your health care provider that can promote health and wellness. ?What does preventive care include? ?A yearly physical exam. This is also called an annual well check. ?Dental exams once or twice a year. ?Routine eye exams. Ask your health care provider how often you should have your eyes checked. ?Personal lifestyle choices, including: ?Daily care of your teeth and gums. ?Regular physical activity. ?Eating a healthy diet. ?Avoiding tobacco and drug use. ?Limiting alcohol use. ?Practicing safe sex. ?Taking low doses of aspirin every day. ?Taking vitamin and mineral supplements as recommended by your health care provider. ?What happens during  an annual well check? ?The services and screenings done by your health care provider during your annual well check will depend on your age, overall health, lifestyle risk factors, and family history of disease. ?Counseling  ?Your health care provider may ask you questions about your: ?Alcohol use. ?Tobacco use. ?Drug use. ?Emotional well-being. ?Home and relationship well-being. ?Sexual activity. ?Eating habits. ?History of falls. ?Memory and ability to understand (cognition). ?Work and work Astronomer. ?Screening  ?You may have the following tests or measurements: ?Height, weight, and BMI. ?Blood pressure. ?Lipid and cholesterol levels. These may be checked every 5 years, or more frequently if you are over 54 years old. ?Skin check. ?Lung cancer screening. You may have this screening every year starting at age 31 if you have a 30-pack-year history of smoking and currently smoke or have quit within the past 15 years. ?Fecal occult blood test (FOBT) of the stool. You may have this test every year starting at age 36. ?Flexible sigmoidoscopy or colonoscopy. You may have a sigmoidoscopy every 5 years or a colonoscopy every 10 years starting at age 55. ?Prostate cancer screening. Recommendations will vary depending on your family history and other risks. ?Hepatitis C blood test. ?Hepatitis B blood test. ?Sexually transmitted disease (STD) testing. ?Diabetes screening. This is done by checking your blood sugar (glucose) after you have not eaten for a while (fasting). You may have this done every 1-3 years. ?Abdominal aortic aneurysm (AAA) screening. You may need this if you are a current or former smoker. ?Osteoporosis. You may be screened starting at age 56 if you are at high risk. ?Talk with your health care provider about your test results, treatment options, and if necessary, the need for  more tests. ?Vaccines  ?Your health care provider may recommend certain vaccines, such as: ?Influenza vaccine. This is recommended  every year. ?Tetanus, diphtheria, and acellular pertussis (Tdap, Td) vaccine. You may need a Td booster every 10 years. ?Zoster vaccine. You may need this after age 83. ?Pneumococcal 13-valent conjugate (PCV13) vaccine. One dose is recommended after age 83. ?Pneumococcal polysaccharide (PPSV23) vaccine. One dose is recommended after age 83. ?Talk to your health care provider about which screenings and vaccines you need and how often you need them. ?This information is not intended to replace advice given to you by your health care provider. Make sure you discuss any questions you have with your health care provider. ?Document Released: 12/18/2015 Document Revised: 08/10/2016 Document Reviewed: 09/22/2015 ?Elsevier Interactive Patient Education ? 2017 [employer] ? ?Fall Prevention in the Home ?Falls can cause injuries. They can happen to people of all ages. There are many things you can do to make your home safe and to help prevent falls. ?What can I do on the outside of my home? ?Regularly fix the edges of walkways and driveways and fix any cracks. ?Remove anything that might make you trip as you walk through a door, such as a raised step or threshold. ?Trim any bushes or trees on the path to your home. ?Use bright outdoor lighting. ?Clear any walking paths of anything that might make someone trip, such as rocks or tools. ?Regularly check to see if handrails are loose or broken. Make sure that both sides of any steps have handrails. ?Any raised decks and porches should have guardrails on the edges. ?Have any leaves, snow, or ice cleared regularly. ?Use sand or salt on walking paths during winter. ?Clean up any spills in your garage right away. This includes oil or grease spills. ?What can I do in the bathroom? ?Use night lights. ?Install grab bars by the toilet and in the tub and shower. Do not use towel bars as grab bars. ?Use non-skid mats or decals in the tub or shower. ?If you need to sit down in the shower,  use a plastic, non-slip stool. ?Keep the floor dry. Clean up any water that spills on the floor as soon as it happens. ?Remove soap buildup in the tub or shower regularly. ?Attach bath mats securely with double-sided non-slip rug tape. ?Do not have throw rugs and other things on the floor that can make you trip. ?What can I do in the bedroom? ?Use night lights. ?Make sure that you have a light by your bed that is easy to reach. ?Do not use any sheets or blankets that are too big for your bed. They should not hang down onto the floor. ?Have a firm chair that has side arms. You can use this for support while you get dressed. ?Do not have throw rugs and other things on the floor that can make you trip. ?What can I do in the kitchen? ?Clean up any spills right away. ?Avoid walking on wet floors. ?Keep items that you use a lot in easy-to-reach places. ?If you need to reach something above you, use a strong step stool that has a grab bar. ?Keep electrical cords out of the way. ?Do not use floor polish or wax that makes floors slippery. If you must use wax, use non-skid floor wax. ?Do not have throw rugs and other things on the floor that can make you trip. ?What can I do with my stairs? ?Do not leave any items on the stairs. ?Make  sure that there are handrails on both sides of the stairs and use them. Fix handrails that are broken or loose. Make sure that handrails are as long as the stairways. ?Check any carpeting to make sure that it is firmly attached to the stairs. Fix any carpet that is loose or worn. ?Avoid having throw rugs at the top or bottom of the stairs. If you do have throw rugs, attach them to the floor with carpet tape. ?Make sure that you have a light switch at the top of the stairs and the bottom of the stairs. If you do not have them, ask someone to add them for you. ?What else can I do to help prevent falls? ?Wear shoes that: ?Do not have high heels. ?Have rubber bottoms. ?Are comfortable and fit you  well. ?Are closed at the toe. Do not wear sandals. ?If you use a stepladder: ?Make sure that it is fully opened. Do not climb a closed stepladder. ?Make sure that both sides of the stepladder are lock

## 2022-03-24 NOTE — Progress Notes (Signed)
? ?Subjective:  ? Casey PolioClyde D Klein is a 81 y.o. male who presents for Medicare Annual/Subsequent preventive examination. ? ?Virtual Visit via Telephone Note ? ?I connected with  Casey Klein on 03/24/22 at  2:45 PM EDT by telephone and verified that I am speaking with the correct person using two identifiers. ? ?Location: ?Patient: Home ?Provider: [facility] ?Persons participating in the virtual visit: patient/Nurse Health Advisor ?  ?I discussed the limitations, risks, security and privacy concerns of performing an evaluation and management service by telephone and the availability of in person appointments. The patient expressed understanding and agreed to proceed. ? ?Interactive audio and video telecommunications were attempted between this nurse and patient, however failed, due to patient having technical difficulties OR patient did not have access to video capability.  We continued and completed visit with audio only. ? ?Some vital signs may be absent or patient reported.  ? ?Casey Subia Hurman HornE Kimberley Dastrup, Casey Klein  ? ?Review of Systems    ? ?Cardiac Risk Factors include: advanced age (>4055men, 63>65 women);sedentary lifestyle;dyslipidemia;hypertension;male gender;Other (see comment), Risk factor comments: CAD, asthma ? ?   ?Objective:  ?  ?Today's Vitals  ? 03/24/22 1331  ?Weight: 192 lb (87.1 kg)  ?PainSc: 4   ? ?Body mass index is 28.35 kg/m?. ? ? ?  03/24/2022  ?  1:36 PM 10/08/2021  ? 12:46 PM 03/23/2021  ?  2:17 PM 03/18/2020  ? 10:09 AM 01/01/2019  ? 10:10 AM 01/11/2017  ?  9:36 AM 08/14/2015  ? 12:59 PM  ?Advanced Directives  ?Does Patient Have a Medical Advance Directive? No Yes Yes Yes No Yes No  ?Type of Special educational needs teacherAdvance Directive  Healthcare Power of NicholsonAttorney;Living will Healthcare Power of FaxonAttorney;Living will Living will  Healthcare Power of Attorney   ?Does patient want to make changes to medical advance directive?    No - Patient declined  No - Patient declined   ?Copy of Healthcare Power of Attorney in Chart?   No - copy requested   No  - copy requested   ?Would patient like information on creating a medical advance directive? No - Patient declined   No - Patient declined Yes (MAU/Ambulatory/Procedural Areas - Information given)  Yes - Educational materials given  ? ? ?Current Medications (verified) ?Outpatient Encounter Medications as of 03/24/2022  ?Medication Sig  ? albuterol (VENTOLIN HFA) 108 (90 Base) MCG/ACT inhaler Inhale 2 puffs into the lungs every 6 (six) hours as needed for wheezing or shortness of breath.  ? amLODipine (NORVASC) 10 MG tablet TAKE 1 TABLET BY MOUTH EVERY DAY  ? aspirin EC 81 MG tablet Take 1 tablet (81 mg total) by mouth daily.  ? atorvastatin (LIPITOR) 10 MG tablet TAKE 1 TABLET BY MOUTH EVERY DAY  ? budesonide-formoterol (SYMBICORT) 80-4.5 MCG/ACT inhaler TAKE 2 PUFFS BY MOUTH TWICE A DAY  ? citalopram (CELEXA) 40 MG tablet TAKE 1 TABLET BY MOUTH EVERY DAY  ? lisinopril (ZESTRIL) 10 MG tablet TAKE 1 TABLET BY MOUTH EVERY DAY  ? meloxicam (MOBIC) 15 MG tablet TAKE 1 TABLET BY MOUTH EVERY DAY  ? montelukast (SINGULAIR) 10 MG tablet TAKE 1 TABLET BY MOUTH EVERYDAY AT BEDTIME  ? Vitamin D, Ergocalciferol, (DRISDOL) 1.25 MG (50000 UNIT) CAPS capsule Take 1 capsule (50,000 Units total) by mouth every 7 (seven) days.  ? ?No facility-administered encounter medications on file as of 03/24/2022.  ? ? ?Allergies (verified) ?Pneumococcal vaccines and Penicillins  ? ?History: ?Past Medical History:  ?Diagnosis Date  ? Allergy   ?  Asthma   ? CAD (coronary artery disease)   ? Nonobstructive  ? Carotid stenosis   ? 50% bilateral 2012  ? Cataract   ? History of kidney stones   ? HTN (hypertension)   ? PAT (paroxysmal atrial tachycardia) (HCC)   ? ?Past Surgical History:  ?Procedure Laterality Date  ? BACK SURGERY    ? lumbar disc  ? CATARACT EXTRACTION W/PHACO Right 05/06/2013  ? Procedure: CATARACT EXTRACTION PHACO AND INTRAOCULAR LENS PLACEMENT (IOC);  Surgeon: Susa Simmonds, MD;  Location: [facility];  Service: Ophthalmology;   Laterality: Right;  CDE: 19.84  ? CATARACT EXTRACTION W/PHACO Left 06/24/2013  ? Procedure: CATARACT EXTRACTION PHACO AND INTRAOCULAR LENS PLACEMENT (IOC);  Surgeon: Susa Simmonds, MD;  Location: [facility];  Service: Ophthalmology;  Laterality: Left;  CDE:  18.21  ? CERVICAL DISC SURGERY    ? EYE SURGERY    ? ?History reviewed. No pertinent family history. ?Social History  ? ?Socioeconomic History  ? Marital status: Significant Other  ?  Spouse name: Not on file  ? Number of children: 3  ? Years of education: Not on file  ? Highest education level: Not on file  ?Occupational History  ? Occupation: retired  ?Tobacco Use  ? Smoking status: Former  ?  Packs/day: 1.00  ?  Years: 10.00  ?  Pack years: 10.00  ?  Types: Cigarettes  ?  Start date: 02/07/1983  ? Smokeless tobacco: Never  ?Vaping Use  ? Vaping Use: Never used  ?Substance and Sexual Activity  ? Alcohol use: No  ? Drug use: No  ? Sexual activity: Never  ?  Birth control/protection: None  ?Other Topics Concern  ? Not on file  ?Social History Narrative  ? Lives alone  ? Karma Greaser friend spends time with him daily  ? Children live close by  ? ?Social Determinants of Health  ? ?Financial Resource Strain: Low Risk   ? Difficulty of Paying Living Expenses: Not hard at all  ?Food Insecurity: No Food Insecurity  ? Worried About Programme researcher, broadcasting/film/video in the Last Year: Never true  ? Ran Out of Food in the Last Year: Never true  ?Transportation Needs: No Transportation Needs  ? Lack of Transportation (Medical): No  ? Lack of Transportation (Non-Medical): No  ?Physical Activity: Insufficiently Active  ? Days of Exercise per Week: 7 days  ? Minutes of Exercise per Session: 10 min  ?Stress: No Stress Concern Present  ? Feeling of Stress : Not at all  ?Social Connections: Moderately Integrated  ? Frequency of Communication with Friends and Family: Twice a week  ? Frequency of Social Gatherings with Friends and Family: Twice a week  ? Attends Religious Services: More than 4 times per  year  ? Active Member of Clubs or Organizations: Yes  ? Attends Banker Meetings: More than 4 times per year  ? Marital Status: Widowed  ? ? ?Tobacco Counseling ?Counseling given: Not Answered ? ? ?Clinical Intake: ? ?Pre-visit preparation completed: Yes ? ?Pain : 0-10 ?Pain Score: 4  ?Pain Type: Chronic pain ?Pain Location: Shoulder ?Pain Orientation: Right, Left ?Pain Descriptors / Indicators: Aching, Discomfort ?Pain Onset: More than a month ago ?Pain Frequency: Intermittent ? ?  ? ?BMI - recorded: 28.35 ?Nutritional Status: BMI 25 -29 Overweight ?Nutritional Risks: None ?Diabetes: No ? ?How often do you need to have someone help you when you read instructions, pamphlets, or other written materials from your doctor or pharmacy?: 1 -  Never ? ?Diabetic? no ? ?Interpreter Needed?: No ? ?Information entered by :: Birch Farino, Casey Klein ? ? ?Activities of Daily Living ? ?  03/24/2022  ?  1:37 PM  ?In your present state of health, do you have any difficulty performing the following activities:  ?Hearing? 0  ?Vision? 0  ?Difficulty concentrating or making decisions? 1  ?Walking or climbing stairs? 0  ?Dressing or bathing? 0  ?Doing errands, shopping? 0  ?Preparing Food and eating ? N  ?Using the Toilet? N  ?In the past six months, have you accidently leaked urine? Y  ?Comment mild intermittent  ?Do you have problems with loss of bowel control? N  ?Managing your Medications? N  ?Managing your Finances? N  ?Housekeeping or managing your Housekeeping? N  ? ? ?Patient Care Team: ?Junie Spencer, FNP as PCP - General (Nurse Practitioner) ?Rollene Rotunda, MD as Consulting Physician (Cardiology) ?Michaelle Copas, MD as Referring Physician (Optometry) ? ?Indicate any recent Medical Services you may have received from other than [facility] providers in the past year (date may be approximate). ? ?   ?Assessment:  ? This is a routine wellness examination for [employer]. ? ?Hearing/Vision screen ?Hearing Screening - Comments::  Denies hearing difficulties   ?Vision Screening - Comments:: Wears rx glasses - up to date with routine eye exams with [facility] ? ?Dietary issues and exercise activities discussed: ?Current Exercise H

## 2022-03-29 ENCOUNTER — Encounter: Payer: Self-pay | Admitting: Family

## 2022-03-29 ENCOUNTER — Ambulatory Visit (INDEPENDENT_AMBULATORY_CARE_PROVIDER_SITE_OTHER): Payer: Medicare Other | Admitting: Family

## 2022-03-29 VITALS — BP 140/67 | HR 79 | Temp 97.6°F | Resp 16 | Ht 69.0 in | Wt 198.6 lb

## 2022-03-29 DIAGNOSIS — E538 Deficiency of other specified B group vitamins: Secondary | ICD-10-CM | POA: Diagnosis not present

## 2022-03-29 DIAGNOSIS — E785 Hyperlipidemia, unspecified: Secondary | ICD-10-CM

## 2022-03-29 DIAGNOSIS — E663 Overweight: Secondary | ICD-10-CM

## 2022-03-29 DIAGNOSIS — F411 Generalized anxiety disorder: Secondary | ICD-10-CM

## 2022-03-29 DIAGNOSIS — J4520 Mild intermittent asthma, uncomplicated: Secondary | ICD-10-CM | POA: Diagnosis not present

## 2022-03-29 DIAGNOSIS — M1990 Unspecified osteoarthritis, unspecified site: Secondary | ICD-10-CM | POA: Diagnosis not present

## 2022-03-29 DIAGNOSIS — E559 Vitamin D deficiency, unspecified: Secondary | ICD-10-CM

## 2022-03-29 DIAGNOSIS — I2510 Atherosclerotic heart disease of native coronary artery without angina pectoris: Secondary | ICD-10-CM | POA: Diagnosis not present

## 2022-03-29 DIAGNOSIS — I10 Essential (primary) hypertension: Secondary | ICD-10-CM | POA: Diagnosis not present

## 2022-03-29 DIAGNOSIS — F331 Major depressive disorder, recurrent, moderate: Secondary | ICD-10-CM

## 2022-03-29 DIAGNOSIS — J301 Allergic rhinitis due to pollen: Secondary | ICD-10-CM

## 2022-03-29 MED ORDER — CETIRIZINE HCL 10 MG PO TABS: 10.0000 mg | ORAL_TABLET | Freq: Every day | ORAL | 11 refills | Status: DC

## 2022-03-29 MED ORDER — FLUTICASONE PROPIONATE 50 MCG/ACT NA SUSP: 2.0000 | Freq: Every day | NASAL | 6 refills | Status: DC

## 2022-03-29 MED ORDER — BENZONATATE 200 MG PO CAPS: 200.0000 mg | ORAL_CAPSULE | Freq: Three times a day (TID) | ORAL | 1 refills | Status: DC | PRN

## 2022-03-29 NOTE — Progress Notes (Signed)
? ?Subjective:  ? ? Patient ID: Casey Klein, male    DOB: 1941-02-09, 81 y.o.   MRN: [id number] ? ?Chief Complaint  ?Patient presents with  ? Medical Management of Chronic Issues  ? ?Pt presents to the office today for chronic follow up. ?Hypertension ?This is a chronic problem. The current episode started more than 1 year ago. The problem has been resolved since onset. Associated symptoms include anxiety, malaise/fatigue and PND. Pertinent negatives include no peripheral edema. Risk factors for coronary artery disease include dyslipidemia, obesity and male gender. The current treatment provides moderate improvement.  ?Asthma ?He complains of cough, frequent throat clearing and wheezing. This is a chronic problem. The current episode started more than 1 year ago. The problem occurs intermittently. Associated symptoms include malaise/fatigue and PND. His symptoms are aggravated by pollen. He reports moderate improvement on treatment. His past medical history is significant for asthma.  ?Arthritis ?Presents for follow-up visit. He complains of pain and stiffness. The symptoms have been stable. Affected locations include the left MCP, right MCP, right knee and left knee. His pain is at a severity of 5/10. Associated symptoms include fatigue.  ?Hyperlipidemia ?This is a chronic problem. The current episode started more than 1 year ago. The problem is controlled. He has no history of hypothyroidism. Current antihyperlipidemic treatment includes statins. The current treatment provides moderate improvement of lipids. Risk factors for coronary artery disease include dyslipidemia, male sex, hypertension and a sedentary lifestyle.  ?Anxiety ?Presents for follow-up visit. Symptoms include depressed mood, excessive worry and nervous/anxious behavior. Patient reports no confusion. Symptoms occur occasionally. The severity of symptoms is moderate.  ? ?His past medical history is significant for anemia and asthma.   ?Depression ?       This is a chronic problem.  The current episode started more than 1 year ago.   The onset quality is gradual.   The problem occurs intermittently.  Associated symptoms include fatigue, helplessness, hopelessness and sad.  Past treatments include SSRIs - Selective serotonin reuptake inhibitors.  Past medical history includes anxiety.     Pertinent negatives include no hypothyroidism. ?Anemia ?Presents for follow-up visit. Symptoms include malaise/fatigue. There has been no confusion. There is no history of hypothyroidism.  ? ? ? ?Review of Systems  ?Constitutional:  Positive for fatigue and malaise/fatigue.  ?Respiratory:  Positive for cough and wheezing.   ?Cardiovascular:  Positive for PND.  ?Musculoskeletal:  Positive for arthritis and stiffness.  ?Psychiatric/Behavioral:  Positive for depression. Negative for confusion. The patient is nervous/anxious.   ?All other systems reviewed and are negative. ? ?   ?Objective:  ? Physical Exam ?Vitals reviewed.  ?Constitutional:   ?   General: He is not in acute distress. ?   Appearance: He is well-developed.  ?HENT:  ?   Head: Normocephalic.  ?   Right Ear: Tympanic membrane normal.  ?   Left Ear: Tympanic membrane normal.  ?Eyes:  ?   General:     ?   Right eye: No discharge.     ?   Left eye: No discharge.  ?   Pupils: Pupils are equal, round, and reactive to light.  ?Neck:  ?   Thyroid: No thyromegaly.  ?Cardiovascular:  ?   Rate and Rhythm: Normal rate and regular rhythm.  ?   Heart sounds: Normal heart sounds. No murmur heard. ?Pulmonary:  ?   Effort: Pulmonary effort is normal. No respiratory distress.  ?   Breath sounds: Normal breath sounds.  No wheezing.  ?   Comments: Intermittent dry cough ?Abdominal:  ?   General: Bowel sounds are normal. There is no distension.  ?   Palpations: Abdomen is soft.  ?   Tenderness: There is no abdominal tenderness.  ?Musculoskeletal:     ?   General: No tenderness. Normal range of motion.  ?   Cervical back:  Normal range of motion and neck supple.  ?Skin: ?   General: Skin is warm and dry.  ?   Findings: No erythema or rash.  ?Neurological:  ?   Mental Status: He is alert and oriented to person, place, and time.  ?   Cranial Nerves: No cranial nerve deficit.  ?   Deep Tendon Reflexes: Reflexes are normal and symmetric.  ?Psychiatric:     ?   Behavior: Behavior normal.     ?   Thought Content: Thought content normal.     ?   Judgment: Judgment normal.  ? ? ? ? ?BP 140/67   Pulse 79   Temp 97.6 ?F (36.4 ?C)   Resp 16   Ht _0  (1.753 m)   Wt 198 lb 9.6 oz (90.1 kg)   SpO2 97%   BMI 29.33 kg/m?  ? ?   ?Assessment & Plan:  ?Casey Klein comes in today with chief complaint of Medical Management of Chronic Issues ? ? ?Diagnosis and orders addressed: ? ?1. Coronary artery disease involving native coronary artery of native heart without angina pectoris ?- CMP14+EGFR ? ?2. Essential hypertension, benign ?- CMP14+EGFR ? ?3. Mild intermittent asthma without complication ?- IRC78+LFYB ? ?4. Arthritis ?- CMP14+EGFR ? ?5. Moderate episode of recurrent major depressive disorder ([place]) ?- CMP14+EGFR ? ?6. GAD (generalized anxiety disorder) ? ?- CMP14+EGFR ? ?7. Hyperlipidemia, unspecified hyperlipidemia type ?- CMP14+EGFR ? ?8. Overweight (BMI 25.0-29.9) ?- CMP14+EGFR ? ?9. Vitamin B 12 deficiency ?- CMP14+EGFR ? ?10. Vitamin D deficiency ? ?- CMP14+EGFR ? ?11. Allergic rhinitis due to pollen, unspecified seasonality ?Start zyrtec and flonase daily ?Avoid allergens when possible  ?Tessalon as needed ?- cetirizine (ZYRTEC) 10 MG tablet; Take 1 tablet (10 mg total) by mouth daily.  Dispense: 30 tablet; Refill: 11 ?- fluticasone (FLONASE) 50 MCG/ACT nasal spray; Place 2 sprays into both nostrils daily.  Dispense: 16 g; Refill: 6 ?- benzonatate (TESSALON) 200 MG capsule; Take 1 capsule (200 mg total) by mouth 3 (three) times daily as needed.  Dispense: 30 capsule; Refill: 1 ?- CMP14+EGFR ? ? ?Labs pending ?Health Maintenance  reviewed ?Diet and exercise encouraged ? ?Follow up plan: ? ?3 months  ? ?Evelina Dun, FNP ? ?

## 2022-03-29 NOTE — Patient Instructions (Signed)
Allergic Rhinitis, Adult  Allergic rhinitis is an allergic reaction that affects the mucous membrane inside the nose. The mucous membrane is the tissue that produces mucus. There are two types of allergic rhinitis: Seasonal. This type is also called hay fever and happens only during certain seasons. Perennial. This type can happen at any time of the year. Allergic rhinitis cannot be spread from person to person. This condition can be mild, moderate, or severe. It can develop at any age and may be outgrown. What are the causes? This condition is caused by allergens. These are things that can cause an allergic reaction. Allergens may differ for seasonal allergic rhinitis and perennial allergic rhinitis. Seasonal allergic rhinitis is triggered by pollen. Pollen can come from grasses, trees, and weeds. Perennial allergic rhinitis may be triggered by: Dust mites. Proteins in a pet's urine, saliva, or dander. Dander is dead skin cells from a pet. Smoke, mold, or car fumes. What increases the risk? You are more likely to develop this condition if you have a family history of allergies or other conditions related to allergies, including: Allergic conjunctivitis. This is inflammation of parts of the eyes and eyelids. Asthma. This condition affects the lungs and makes it hard to breathe. Atopic dermatitis or eczema. This is long term (chronic) inflammation of the skin. Food allergies. What are the signs or symptoms? Symptoms of this condition include: Sneezing or coughing. A stuffy nose (nasal congestion), itchy nose, or nasal discharge. Itchy eyes and tearing of the eyes. A feeling of mucus dripping down the back of your throat (postnasal drip). Trouble sleeping. Tiredness or fatigue. Headache. Sore throat. How is this diagnosed? This condition may be diagnosed with your symptoms, medical history, and physical exam. Your health care provider may check for related conditions, such  as: Asthma. Pink eye. This is eye inflammation caused by infection (conjunctivitis). Ear infection. Upper respiratory infection. This is an infection in the nose, throat, or upper airways. You may also have tests to find out which allergens trigger your symptoms. These may include skin tests or blood tests. How is this treated? There is no cure for this condition, but treatment can help control symptoms. Treatment may include: Taking medicines that block allergy symptoms, such as corticosteroids and antihistamines. Medicine may be given as a shot, nasal spray, or pill. Avoiding any allergens. Being exposed again and again to tiny amounts of allergens to help you build a defense against allergens (immunotherapy). This is done if other treatments have not helped. It may include: Allergy shots. These are injected medicines that have small amounts of allergen in them. Sublingual immunotherapy. This involves taking small doses of a medicine with allergen in it under your tongue. If these treatments do not work, your health care provider may prescribe newer, stronger medicines. Follow these instructions at home: Avoiding allergens Find out what you are allergic to and avoid those allergens. These are some things you can do to help avoid allergens: If you have perennial allergies: Replace carpet with wood, tile, or vinyl flooring. Carpet can trap dander and dust. Do not smoke. Do not allow smoking in your home. Change your heating and air conditioning filters at least once a month. If you have seasonal allergies, take these steps during allergy season: Keep windows closed as much as possible. Plan outdoor activities when pollen counts are lowest. Check pollen counts before you plan outdoor activities. When coming indoors, change clothing and shower before sitting on furniture or bedding. If you have a pet   in the house that produces allergens: Keep the pet out of the bedroom. Vacuum, sweep, and  dust regularly. General instructions Take over-the-counter and prescription medicines only as told by your health care provider. Drink enough fluid to keep your urine pale yellow. Keep all follow-up visits as told by your health care provider. This is important. Where to find more information American Academy of Allergy, Asthma & Immunology: www.aaaai.org Contact a health care provider if: You have a fever. You develop a cough that does not go away. You make whistling sounds when you breathe (wheeze). Your symptoms slow you down or stop you from doing your normal activities each day. Get help right away if: You have shortness of breath. This symptom may represent a serious problem that is an emergency. Do not wait to see if the symptom will go away. Get medical help right away. Call your local emergency services (911 in the [place] Do not drive yourself to the hospital. Summary Allergic rhinitis may be managed by taking medicines as directed and avoiding allergens. If you have seasonal allergies, keep windows closed as much as possible during allergy season. Contact your health care provider if you develop a fever or a cough that does not go away. This information is not intended to replace advice given to you by your health care provider. Make sure you discuss any questions you have with your health care provider. Document Revised: 01/10/2020 Document Reviewed: 11/19/2019 Elsevier Patient Education  2023 [employer]  

## 2022-03-30 LAB — CMP14+EGFR
ALT: 15 IU/L (ref 0–44)
AST: 16 IU/L (ref 0–40)
Albumin/Globulin Ratio: 1.9 (ref 1.2–2.2)
Albumin: 4.3 g/dL (ref 3.6–4.6)
Alkaline Phosphatase: 56 IU/L (ref 44–121)
BUN/Creatinine Ratio: 10 (ref 10–24)
BUN: 11 mg/dL (ref 8–27)
Bilirubin Total: 0.6 mg/dL (ref 0.0–1.2)
CO2: 20 mmol/L (ref 20–29)
Calcium: 8.8 mg/dL (ref 8.6–10.2)
Chloride: 106 mmol/L (ref 96–106)
Creatinine, Ser: 1.06 mg/dL (ref 0.76–1.27)
Globulin, Total: 2.3 g/dL (ref 1.5–4.5)
Glucose: 99 mg/dL (ref 70–99)
Potassium: 3.9 mmol/L (ref 3.5–5.2)
Sodium: 144 mmol/L (ref 134–144)
Total Protein: 6.6 g/dL (ref 6.0–8.5)
eGFR: 70 mL/min/{1.73_m2} (ref >59)

## 2022-04-01 ENCOUNTER — Other Ambulatory Visit: Payer: Self-pay | Admitting: Family

## 2022-04-01 DIAGNOSIS — I2510 Atherosclerotic heart disease of native coronary artery without angina pectoris: Secondary | ICD-10-CM

## 2022-04-01 DIAGNOSIS — I10 Essential (primary) hypertension: Secondary | ICD-10-CM

## 2022-06-04 ENCOUNTER — Other Ambulatory Visit: Payer: Self-pay | Admitting: Family

## 2022-06-04 DIAGNOSIS — E8881 Metabolic syndrome: Secondary | ICD-10-CM

## 2022-06-04 DIAGNOSIS — I2510 Atherosclerotic heart disease of native coronary artery without angina pectoris: Secondary | ICD-10-CM

## 2022-06-04 DIAGNOSIS — E785 Hyperlipidemia, unspecified: Secondary | ICD-10-CM

## 2022-06-12 ENCOUNTER — Other Ambulatory Visit: Payer: Self-pay | Admitting: Family

## 2022-06-12 DIAGNOSIS — M1990 Unspecified osteoarthritis, unspecified site: Secondary | ICD-10-CM

## 2022-06-12 DIAGNOSIS — F411 Generalized anxiety disorder: Secondary | ICD-10-CM

## 2022-06-12 DIAGNOSIS — I10 Essential (primary) hypertension: Secondary | ICD-10-CM

## 2022-06-12 DIAGNOSIS — F331 Major depressive disorder, recurrent, moderate: Secondary | ICD-10-CM

## 2022-06-28 ENCOUNTER — Ambulatory Visit (INDEPENDENT_AMBULATORY_CARE_PROVIDER_SITE_OTHER): Payer: Medicare Other | Admitting: Family

## 2022-06-28 ENCOUNTER — Encounter: Payer: Self-pay | Admitting: Family

## 2022-06-28 VITALS — BP 134/68 | HR 85 | Temp 97.9°F | Ht 69.0 in | Wt 198.8 lb

## 2022-06-28 DIAGNOSIS — M1990 Unspecified osteoarthritis, unspecified site: Secondary | ICD-10-CM | POA: Diagnosis not present

## 2022-06-28 DIAGNOSIS — J4540 Moderate persistent asthma, uncomplicated: Secondary | ICD-10-CM | POA: Diagnosis not present

## 2022-06-28 DIAGNOSIS — E663 Overweight: Secondary | ICD-10-CM

## 2022-06-28 DIAGNOSIS — Z0001 Encounter for general adult medical examination with abnormal findings: Secondary | ICD-10-CM

## 2022-06-28 DIAGNOSIS — E538 Deficiency of other specified B group vitamins: Secondary | ICD-10-CM | POA: Diagnosis not present

## 2022-06-28 DIAGNOSIS — I2510 Atherosclerotic heart disease of native coronary artery without angina pectoris: Secondary | ICD-10-CM | POA: Diagnosis not present

## 2022-06-28 DIAGNOSIS — I10 Essential (primary) hypertension: Secondary | ICD-10-CM

## 2022-06-28 DIAGNOSIS — E785 Hyperlipidemia, unspecified: Secondary | ICD-10-CM | POA: Diagnosis not present

## 2022-06-28 DIAGNOSIS — E559 Vitamin D deficiency, unspecified: Secondary | ICD-10-CM

## 2022-06-28 DIAGNOSIS — F331 Major depressive disorder, recurrent, moderate: Secondary | ICD-10-CM

## 2022-06-28 DIAGNOSIS — Z0000 Encounter for general adult medical examination without abnormal findings: Secondary | ICD-10-CM | POA: Diagnosis not present

## 2022-06-28 DIAGNOSIS — F411 Generalized anxiety disorder: Secondary | ICD-10-CM

## 2022-06-28 MED ORDER — BUPROPION HCL ER (XL) 150 MG PO TB24: 150.0000 mg | ORAL_TABLET | Freq: Every day | ORAL | 1 refills | Status: DC

## 2022-06-28 MED ORDER — BUDESONIDE-FORMOTEROL FUMARATE 160-4.5 MCG/ACT IN AERO: 2.0000 | INHALATION_SPRAY | Freq: Two times a day (BID) | RESPIRATORY_TRACT | 3 refills | Status: DC

## 2022-06-28 NOTE — Patient Instructions (Signed)
Major Depressive Disorder, Adult Major depressive disorder (MDD) is a mental health condition. It may also be called clinical depression or unipolar depression. MDD causes symptoms of sadness, hopelessness, and loss of interest in things. These symptoms last most of the day, almost every day, for 2 weeks. MDD can also cause physical symptoms. It can interfere with relationships and with everyday activities, such as work, school, and activities that are usually pleasant. MDD may be mild, moderate, or severe. It may be single-episode MDD, which happens once, or recurrent MDD, which may occur multiple times. What are the causes? The exact cause of this condition is not known. MDD is most likely caused by a combination of things, which may include: Your personality traits. Learned or conditioned behaviors or thoughts or feelings that reinforce negativity. Any alcohol or substance misuse. Long-term (chronic) physical or mental health illness. Going through a traumatic experience or major life changes. What increases the risk? The following factors may make someone more likely to develop MDD: A family history of depression. Being a woman. Troubled family relationships. Abnormally low levels of certain brain chemicals. Traumatic or painful events in childhood, especially abuse or loss of a parent. A lot of stress from life experiences, such as poor living conditions or discrimination. Chronic physical illness or other mental health disorders. What are the signs or symptoms? The main symptoms of MDD usually include: Constant depressed or irritable mood. A loss of interest in things and activities. Other symptoms include: Sleeping or eating too much or too little. Unexplained weight gain or weight loss. Tiredness or low energy. Being agitated, restless, or weak. Feeling hopeless, worthless, or guilty. Trouble thinking clearly or making decisions. Thoughts of suicide or thoughts of harming  others. Isolating oneself or avoiding other people or activities. Trouble completing tasks, work, or any normal obligations. Severe symptoms of this condition may include: Psychotic depression.This may include false beliefs, or delusions. It may also include seeing, hearing, tasting, smelling, or feeling things that are not real (hallucinations). Chronic depression or persistent depressive disorder. This is low-level depression that lasts for at least 2 years. Melancholic depression, or feeling extremely sad and hopeless. Catatonic depression, which includes trouble speaking and trouble moving. How is this diagnosed? This condition may be diagnosed based on: Your symptoms. Your medical and mental health history. You may be asked questions about your lifestyle, including any drug and alcohol use. A physical exam. Blood tests to rule out other conditions. MDD is confirmed if you have the following symptoms most of the day, nearly every day, in a 2-week period: Either a depressed mood or loss of interest. At least four other MDD symptoms. How is this treated? This condition is usually treated by mental health professionals, such as psychologists, psychiatrists, and clinical social workers. You may need more than one type of treatment. Treatment may include: Psychotherapy, also called talk therapy or counseling. Types of psychotherapy include: Cognitive behavioral therapy (CBT). This teaches you to recognize unhealthy feelings, thoughts, and behaviors, and replace them with positive thoughts and actions. Interpersonal therapy (IPT). This helps you to improve the way you communicate with others or relate to them. Family therapy. This treatment includes members of your family. Medicines to treat anxiety and depression. These medicines help to balance the brain chemicals that affect your emotions. Lifestyle changes. You may be asked to: Limit alcohol use and avoid drug use. Get regular  exercise. Get plenty of sleep. Make healthy eating choices. Spend more time outdoors. Brain stimulation. This may   be done if symptoms are very severe and other treatments have not worked. Examples of this treatment are electroconvulsive therapy and transcranial magnetic stimulation. Follow these instructions at home: Activity Exercise regularly and spend time outdoors. Find activities that you enjoy doing, and make time to do them. Find healthy ways to manage stress, such as: Meditation or deep breathing. Spending time in nature. Journaling. Return to your normal activities as told by your health care provider. Ask your health care provider what activities are safe for you. Alcohol and drug use If you drink alcohol: Limit how much you use to: 0-1 drink a day for women who are not pregnant. 0-2 drinks a day for men. Be aware of how much alcohol is in your drink. In the [place] one drink equals one 12 oz bottle of beer (355 mL), one 5 oz glass of wine (148 mL), or one 1 oz glass of hard liquor (44 mL). Discuss your alcohol use with your health care provider. Alcohol can affect any antidepressant medicines you are taking. Discuss any drug use with your health care provider. General instructions  Take over-the-counter and prescription medicines only as told by your health care provider. Eat a healthy diet and get plenty of sleep. Consider joining a support group. Your health care provider may be able to recommend one. Keep all follow-up visits as told by your health care provider. This is important. Where to find more information National Alliance on Mental Illness: www.nami.org [place] National Institute of Mental Health: www.nimh.nih.gov Contact a health care provider if: Your symptoms get worse. You develop new symptoms. Get help right away if: You self-harm. You have serious thoughts about hurting yourself or others. You hallucinate. If you ever feel like you may hurt yourself or  others, or have thoughts about taking your own life, get help right away. Go to your nearest emergency department or: Call your local emergency services (911 in the [place] Call a suicide crisis helpline, such as the National Suicide Prevention Lifeline at 1-800-273-8255 or 988 in the [place] This is open 24 hours a day in the [place] Text the Crisis Text Line at 741741 (in the [place] Summary Major depressive disorder (MDD) is a mental health condition. MDD causes symptoms of sadness, hopelessness, and loss of interest in things. These symptoms last most of the day, almost every day, for 2 weeks. The symptoms of MDD can interfere with relationships and with everyday activities. Treatments and support are available for people who develop MDD. You may need more than one type of treatment. Get help right away if you have serious thoughts about hurting yourself or others. This information is not intended to replace advice given to you by your health care provider. Make sure you discuss any questions you have with your health care provider. Document Revised: 06/16/2021 Document Reviewed: 11/02/2019 Elsevier Patient Education  2023 Elsevier Inc.  

## 2022-06-28 NOTE — Progress Notes (Signed)
Subjective:    Patient ID: Casey Klein, male    DOB: Aug 14, 1941, 81 y.o.   MRN: [id number]  Chief Complaint  Patient presents with   Chronic Care Management   Pt presents to the office today for CPE. Hypertension This is a chronic problem. The current episode started more than 1 year ago. The problem has been resolved since onset. Associated symptoms include anxiety, malaise/fatigue, peripheral edema and shortness of breath. Risk factors for coronary artery disease include dyslipidemia, obesity and male gender. The current treatment provides moderate improvement.  Asthma He complains of cough and shortness of breath. There is no wheezing. This is a chronic problem. The current episode started more than 1 year ago. Associated symptoms include malaise/fatigue. His symptoms are aggravated by pollen. His symptoms are alleviated by rest. He reports minimal improvement on treatment. His symptoms are not alleviated by rest. His past medical history is significant for asthma.  Arthritis Presents for follow-up visit. He complains of pain and stiffness. The symptoms have been stable. Affected locations include the right knee and left knee. His pain is at a severity of 5/10.  Hyperlipidemia This is a new problem. The current episode started more than 1 year ago. The problem is controlled. Recent lipid tests were reviewed and are normal. Associated symptoms include shortness of breath. Current antihyperlipidemic treatment includes statins. The current treatment provides moderate improvement of lipids. Risk factors for coronary artery disease include dyslipidemia, hypertension, male sex and a sedentary lifestyle.  Anxiety Presents for follow-up visit. Symptoms include excessive worry, nervous/anxious behavior and shortness of breath. Patient reports no depressed mood or irritability. Symptoms occur occasionally. The severity of symptoms is mild.   His past medical history is significant for asthma.   Depression        This is a chronic problem.  The current episode started more than 1 year ago.   The onset quality is gradual.   The problem occurs intermittently.  Associated symptoms include helplessness, hopelessness and sad.  Associated symptoms include not irritable.  Past treatments include SSRIs - Selective serotonin reuptake inhibitors.  Past medical history includes anxiety.       Review of Systems  Constitutional:  Positive for malaise/fatigue. Negative for irritability.  Respiratory:  Positive for cough and shortness of breath. Negative for wheezing.   Musculoskeletal:  Positive for arthritis and stiffness.  Psychiatric/Behavioral:  Positive for depression. The patient is nervous/anxious.    History reviewed. No pertinent family history. Social History   Socioeconomic History   Marital status: Significant Other    Spouse name: Not on file   Number of children: 3   Years of education: Not on file   Highest education level: Not on file  Occupational History   Occupation: retired  Tobacco Use   Smoking status: Former    Packs/day: 1.00    Years: 10.00    Total pack years: 10.00    Types: Cigarettes    Start date: 02/07/1983   Smokeless tobacco: Never  Vaping Use   Vaping Use: Never used  Substance and Sexual Activity   Alcohol use: No   Drug use: No   Sexual activity: Never    Birth control/protection: None  Other Topics Concern   Not on file  Social History Narrative   Lives alone   Threasa Beards friend spends time with him daily   Children live close by   Social Determinants of Health   Financial Resource Strain: Low Risk  (03/24/2022)   Overall  Financial Resource Strain (CARDIA)    Difficulty of Paying Living Expenses: Not hard at all  Food Insecurity: No Food Insecurity (03/24/2022)   Hunger Vital Sign    Worried About Running Out of Food in the Last Year: Never true    Ran Out of Food in the Last Year: Never true  Transportation Needs: No Transportation Needs  (03/24/2022)   PRAPARE - Hydrologist (Medical): No    Lack of Transportation (Non-Medical): No  Physical Activity: Insufficiently Active (03/24/2022)   Exercise Vital Sign    Days of Exercise per Week: 7 days    Minutes of Exercise per Session: 10 min  Stress: No Stress Concern Present (03/24/2022)   [place]    Feeling of Stress : Not at all  Social Connections: Moderately Integrated (03/24/2022)   Social Connection and Isolation Panel [NHANES]    Frequency of Communication with Friends and Family: Twice a week    Frequency of Social Gatherings with Friends and Family: Twice a week    Attends Religious Services: More than 4 times per year    Active Member of [employer] or Organizations: Yes    Attends Archivist Meetings: More than 4 times per year    Marital Status: Widowed        Objective:   Physical Exam Vitals reviewed.  Constitutional:      General: He is not irritable.He is not in acute distress.    Appearance: He is well-developed.  HENT:     Head: Normocephalic.     Right Ear: Tympanic membrane normal.     Left Ear: Tympanic membrane normal.  Eyes:     General:        Right eye: No discharge.        Left eye: No discharge.     Pupils: Pupils are equal, round, and reactive to light.  Neck:     Thyroid: No thyromegaly.  Cardiovascular:     Rate and Rhythm: Normal rate and regular rhythm.     Heart sounds: Normal heart sounds. No murmur heard. Pulmonary:     Effort: Pulmonary effort is normal. No respiratory distress.     Breath sounds: Normal breath sounds. No wheezing.  Abdominal:     General: Bowel sounds are normal. There is no distension.     Palpations: Abdomen is soft.     Tenderness: There is no abdominal tenderness.  Musculoskeletal:        General: No tenderness. Normal range of motion.     Cervical back: Normal range of motion and neck supple.   Skin:    General: Skin is warm and dry.     Findings: No erythema or rash.  Neurological:     Mental Status: He is alert and oriented to person, place, and time.     Cranial Nerves: No cranial nerve deficit.     Deep Tendon Reflexes: Reflexes are normal and symmetric.  Psychiatric:        Behavior: Behavior normal.        Thought Content: Thought content normal.        Judgment: Judgment normal.       BP 134/68   Pulse 85   Temp 97.9 F (36.6 C)   Ht 5' 9"  (1.753 m)   Wt 198 lb 12.8 oz (90.2 kg)   SpO2 98%   BMI 29.36 kg/m      Assessment &  Plan:  Casey Klein comes in today with chief complaint of Chronic Care Management   Diagnosis and orders addressed:  1. Essential hypertension, benign - CMP14+EGFR - CBC with Differential/Platelet  2. Coronary artery disease involving native coronary artery of native heart without angina pectoris - CMP14+EGFR - CBC with Differential/Platelet  3. Moderate persistent asthma without complication Will increase Symbicort to 160-4.5 from 80-4.5 Avoid Allergens  - CMP14+EGFR - CBC with Differential/Platelet - budesonide-formoterol (SYMBICORT) 160-4.5 MCG/ACT inhaler; Inhale 2 puffs into the lungs 2 (two) times daily.  Dispense: 1 each; Refill: 3  4. Arthritis - CMP14+EGFR - CBC with Differential/Platelet  5. Moderate episode of recurrent major depressive disorder (HCC) Will add Wellbutrin 150 today Continue Celexa  - CMP14+EGFR - CBC with Differential/Platelet - buPROPion (WELLBUTRIN XL) 150 MG 24 hr tablet; Take 1 tablet (150 mg total) by mouth daily.  Dispense: 90 tablet; Refill: 1  6. GAD (generalized anxiety disorder) - CMP14+EGFR - CBC with Differential/Platelet - buPROPion (WELLBUTRIN XL) 150 MG 24 hr tablet; Take 1 tablet (150 mg total) by mouth daily.  Dispense: 90 tablet; Refill: 1  7. Vitamin D deficiency - CMP14+EGFR - CBC with Differential/Platelet  8. Hyperlipidemia, unspecified hyperlipidemia  type - CMP14+EGFR - CBC with Differential/Platelet  9. Overweight (BMI 25.0-29.9) - CMP14+EGFR - CBC with Differential/Platelet  10. Vitamin B 12 deficiency - CMP14+EGFR - CBC with Differential/Platelet  11. Annual physical exam - CMP14+EGFR - CBC with Differential/Platelet - Lipid panel - TSH   Labs pending Health Maintenance reviewed Diet and exercise encouraged  Follow up plan: 1 month to recheck Depression and Asthma   Evelina Dun, FNP

## 2022-06-29 ENCOUNTER — Telehealth: Payer: Self-pay | Admitting: Family

## 2022-06-29 LAB — LIPID PANEL
Chol/HDL Ratio: 3.3 ratio (ref 0.0–5.0)
Cholesterol, Total: 133 mg/dL (ref 100–199)
HDL: 40 mg/dL (ref >39)
LDL Chol Calc (NIH): 57 mg/dL (ref 0–99)
Triglycerides: 223 mg/dL — ABNORMAL HIGH (ref 0–149)
VLDL Cholesterol Cal: 36 mg/dL (ref 5–40)

## 2022-06-29 LAB — CBC WITH DIFFERENTIAL/PLATELET
Basophils Absolute: 0.1 10*3/uL (ref 0.0–0.2)
Basos: 1 %
EOS (ABSOLUTE): 0.2 10*3/uL (ref 0.0–0.4)
Eos: 2 %
Hematocrit: 39.5 % (ref 37.5–51.0)
Hemoglobin: 13.1 g/dL (ref 13.0–17.7)
Immature Grans (Abs): 0.1 10*3/uL (ref 0.0–0.1)
Immature Granulocytes: 1 %
Lymphocytes Absolute: 1.9 10*3/uL (ref 0.7–3.1)
Lymphs: 19 %
MCH: 30.6 pg (ref 26.6–33.0)
MCHC: 33.2 g/dL (ref 31.5–35.7)
MCV: 92 fL (ref 79–97)
Monocytes Absolute: 1 10*3/uL — ABNORMAL HIGH (ref 0.1–0.9)
Monocytes: 10 %
Neutrophils Absolute: 6.6 10*3/uL (ref 1.4–7.0)
Neutrophils: 67 %
Platelets: 241 10*3/uL (ref 150–450)
RBC: 4.28 x10E6/uL (ref 4.14–5.80)
RDW: 17.2 % — ABNORMAL HIGH (ref 11.6–15.4)
WBC: 9.9 10*3/uL (ref 3.4–10.8)

## 2022-06-29 LAB — CMP14+EGFR
ALT: 17 IU/L (ref 0–44)
AST: 17 IU/L (ref 0–40)
Albumin/Globulin Ratio: 2 (ref 1.2–2.2)
Albumin: 4.7 g/dL (ref 3.7–4.7)
Alkaline Phosphatase: 46 IU/L (ref 44–121)
BUN/Creatinine Ratio: 10 (ref 10–24)
BUN: 11 mg/dL (ref 8–27)
Bilirubin Total: 0.7 mg/dL (ref 0.0–1.2)
CO2: 23 mmol/L (ref 20–29)
Calcium: 8.7 mg/dL (ref 8.6–10.2)
Chloride: 106 mmol/L (ref 96–106)
Creatinine, Ser: 1.14 mg/dL (ref 0.76–1.27)
Globulin, Total: 2.4 g/dL (ref 1.5–4.5)
Glucose: 82 mg/dL (ref 70–99)
Potassium: 4.1 mmol/L (ref 3.5–5.2)
Sodium: 143 mmol/L (ref 134–144)
Total Protein: 7.1 g/dL (ref 6.0–8.5)
eGFR: 64 mL/min/{1.73_m2} (ref >59)

## 2022-06-29 LAB — TSH: TSH: 2.47 u[IU]/mL (ref 0.450–4.500)

## 2022-06-29 NOTE — Telephone Encounter (Signed)
3. Moderate persistent asthma without complication Will increase Symbicort to 160-4.5 from 80-4.5   Please review and advise.

## 2022-06-30 NOTE — Telephone Encounter (Signed)
Pt called stating that he went to the pharmacy and picked up his inhalers and said the pharmacy told him which medicine and dosage was costing him $32 but he cant remember what they told him.  

## 2022-06-30 NOTE — Telephone Encounter (Signed)
Pt is not sure what prescriptions were $32. Informed pt that that is probably a 90 day supply. He will call pharmacy to verify what the prescriptions were for. Advised pt that we could send in a 30 day supply to help with cost. Pt to call us back with information.

## 2022-06-30 NOTE — Telephone Encounter (Signed)
We increased his inhaler, he was already getting this medication. The increase dose should not cause the price to increase. Does he know how much he paid prior? We do have samples of inhalers we could give him if needed.

## 2022-06-30 NOTE — Telephone Encounter (Signed)
Spoke with [facility] pharmacy.  The $32 prescription is for Zyrtec. Insurance is not going to cover per pharmacy. Pt informed and states that he already has allergy medication at home and will not purchase the Zyrtec at pharmacy.

## 2022-07-07 ENCOUNTER — Other Ambulatory Visit: Payer: Self-pay | Admitting: Family

## 2022-07-07 DIAGNOSIS — I2510 Atherosclerotic heart disease of native coronary artery without angina pectoris: Secondary | ICD-10-CM

## 2022-07-07 DIAGNOSIS — I10 Essential (primary) hypertension: Secondary | ICD-10-CM

## 2022-07-29 ENCOUNTER — Encounter: Payer: Self-pay | Admitting: Family

## 2022-07-29 ENCOUNTER — Ambulatory Visit (INDEPENDENT_AMBULATORY_CARE_PROVIDER_SITE_OTHER): Payer: Medicare Other | Admitting: Family

## 2022-07-29 VITALS — BP 130/73 | HR 87 | Temp 97.3°F | Resp 20 | Ht 69.0 in | Wt 195.0 lb

## 2022-07-29 DIAGNOSIS — F411 Generalized anxiety disorder: Secondary | ICD-10-CM | POA: Diagnosis not present

## 2022-07-29 DIAGNOSIS — I10 Essential (primary) hypertension: Secondary | ICD-10-CM

## 2022-07-29 DIAGNOSIS — F331 Major depressive disorder, recurrent, moderate: Secondary | ICD-10-CM

## 2022-07-29 DIAGNOSIS — J4540 Moderate persistent asthma, uncomplicated: Secondary | ICD-10-CM

## 2022-07-29 DIAGNOSIS — E663 Overweight: Secondary | ICD-10-CM

## 2022-07-29 MED ORDER — BUPROPION HCL ER (XL) 300 MG PO TB24: 300.0000 mg | ORAL_TABLET | Freq: Every day | ORAL | 1 refills | Status: DC

## 2022-07-29 NOTE — Patient Instructions (Signed)
Major Depressive Disorder, Adult Major depressive disorder (MDD) is a mental health condition. It may also be called clinical depression or unipolar depression. MDD causes symptoms of sadness, hopelessness, and loss of interest in things. These symptoms last most of the day, almost every day, for 2 weeks. MDD can also cause physical symptoms. It can interfere with relationships and with everyday activities, such as work, school, and activities that are usually pleasant. MDD may be mild, moderate, or severe. It may be single-episode MDD, which happens once, or recurrent MDD, which may occur multiple times. What are the causes? The exact cause of this condition is not known. MDD is most likely caused by a combination of things, which may include: Your personality traits. Learned or conditioned behaviors or thoughts or feelings that reinforce negativity. Any alcohol or substance misuse. Long-term (chronic) physical or mental health illness. Going through a traumatic experience or major life changes. What increases the risk? The following factors may make someone more likely to develop MDD: A family history of depression. Being a woman. Troubled family relationships. Abnormally low levels of certain brain chemicals. Traumatic or painful events in childhood, especially abuse or loss of a parent. A lot of stress from life experiences, such as poor living conditions or discrimination. Chronic physical illness or other mental health disorders. What are the signs or symptoms? The main symptoms of MDD usually include: Constant depressed or irritable mood. A loss of interest in things and activities. Other symptoms include: Sleeping or eating too much or too little. Unexplained weight gain or weight loss. Tiredness or low energy. Being agitated, restless, or weak. Feeling hopeless, worthless, or guilty. Trouble thinking clearly or making decisions. Thoughts of suicide or thoughts of harming  others. Isolating oneself or avoiding other people or activities. Trouble completing tasks, work, or any normal obligations. Severe symptoms of this condition may include: Psychotic depression.This may include false beliefs, or delusions. It may also include seeing, hearing, tasting, smelling, or feeling things that are not real (hallucinations). Chronic depression or persistent depressive disorder. This is low-level depression that lasts for at least 2 years. Melancholic depression, or feeling extremely sad and hopeless. Catatonic depression, which includes trouble speaking and trouble moving. How is this diagnosed? This condition may be diagnosed based on: Your symptoms. Your medical and mental health history. You may be asked questions about your lifestyle, including any drug and alcohol use. A physical exam. Blood tests to rule out other conditions. MDD is confirmed if you have the following symptoms most of the day, nearly every day, in a 2-week period: Either a depressed mood or loss of interest. At least four other MDD symptoms. How is this treated? This condition is usually treated by mental health professionals, such as psychologists, psychiatrists, and clinical social workers. You may need more than one type of treatment. Treatment may include: Psychotherapy, also called talk therapy or counseling. Types of psychotherapy include: Cognitive behavioral therapy (CBT). This teaches you to recognize unhealthy feelings, thoughts, and behaviors, and replace them with positive thoughts and actions. Interpersonal therapy (IPT). This helps you to improve the way you communicate with others or relate to them. Family therapy. This treatment includes members of your family. Medicines to treat anxiety and depression. These medicines help to balance the brain chemicals that affect your emotions. Lifestyle changes. You may be asked to: Limit alcohol use and avoid drug use. Get regular  exercise. Get plenty of sleep. Make healthy eating choices. Spend more time outdoors. Brain stimulation. This may   be done if symptoms are very severe and other treatments have not worked. Examples of this treatment are electroconvulsive therapy and transcranial magnetic stimulation. Follow these instructions at home: Activity Exercise regularly and spend time outdoors. Find activities that you enjoy doing, and make time to do them. Find healthy ways to manage stress, such as: Meditation or deep breathing. Spending time in nature. Journaling. Return to your normal activities as told by your health care provider. Ask your health care provider what activities are safe for you. Alcohol and drug use If you drink alcohol: Limit how much you use to: 0-1 drink a day for women who are not pregnant. 0-2 drinks a day for men. Be aware of how much alcohol is in your drink. In the [place] one drink equals one 12 oz bottle of beer (355 mL), one 5 oz glass of wine (148 mL), or one 1 oz glass of hard liquor (44 mL). Discuss your alcohol use with your health care provider. Alcohol can affect any antidepressant medicines you are taking. Discuss any drug use with your health care provider. General instructions  Take over-the-counter and prescription medicines only as told by your health care provider. Eat a healthy diet and get plenty of sleep. Consider joining a support group. Your health care provider may be able to recommend one. Keep all follow-up visits as told by your health care provider. This is important. Where to find more information National Alliance on Mental Illness: www.nami.org [place] National Institute of Mental Health: www.nimh.nih.gov Contact a health care provider if: Your symptoms get worse. You develop new symptoms. Get help right away if: You self-harm. You have serious thoughts about hurting yourself or others. You hallucinate. If you ever feel like you may hurt yourself or  others, or have thoughts about taking your own life, get help right away. Go to your nearest emergency department or: Call your local emergency services (911 in the [place] Call a suicide crisis helpline, such as the National Suicide Prevention Lifeline at 1-800-273-8255 or 988 in the [place] This is open 24 hours a day in the [place] Text the Crisis Text Line at 741741 (in the [place] Summary Major depressive disorder (MDD) is a mental health condition. MDD causes symptoms of sadness, hopelessness, and loss of interest in things. These symptoms last most of the day, almost every day, for 2 weeks. The symptoms of MDD can interfere with relationships and with everyday activities. Treatments and support are available for people who develop MDD. You may need more than one type of treatment. Get help right away if you have serious thoughts about hurting yourself or others. This information is not intended to replace advice given to you by your health care provider. Make sure you discuss any questions you have with your health care provider. Document Revised: 06/16/2021 Document Reviewed: 11/02/2019 Elsevier Patient Education  2023 Elsevier Inc.  

## 2022-07-29 NOTE — Progress Notes (Signed)
Subjective:    Patient ID: Casey Klein, male    DOB: 24-Oct-1941, 81 y.o.   MRN: [id number]  Chief Complaint  Patient presents with   Follow up depression/asthma   PT presents to the office today for chronic follow up and recheck depression and asthma.   We added Wellbutrin 150 mg. States he has been doing fair. Reports he feels like he is "give out".   We also increased his Symbicort to 160-4.5 from 80-4.5. Reports his breathing is improved.  Depression        This is a chronic problem.  The current episode started more than 1 year ago.   The onset quality is gradual.   Associated symptoms include fatigue, helplessness, hopelessness, insomnia, restlessness, decreased interest and sad.  Past treatments include SSRIs - Selective serotonin reuptake inhibitors.  Past medical history includes anxiety.   Anxiety Presents for follow-up visit. Symptoms include depressed mood, excessive worry, insomnia, irritability, nervous/anxious behavior, restlessness and shortness of breath. Symptoms occur occasionally. The severity of symptoms is moderate.   His past medical history is significant for asthma.  Asthma He complains of shortness of breath and wheezing. There is no cough. This is a chronic problem. The current episode started more than 1 year ago. The problem occurs intermittently. His symptoms are aggravated by pollen. His symptoms are alleviated by rest. He reports moderate improvement on treatment. His symptoms are not alleviated by rest and beta-agonist. His past medical history is significant for asthma.      Review of Systems  Constitutional:  Positive for fatigue and irritability.  Respiratory:  Positive for shortness of breath and wheezing. Negative for cough.   Psychiatric/Behavioral:  Positive for depression. The patient is nervous/anxious and has insomnia.   All other systems reviewed and are negative.      Objective:   Physical Exam Vitals reviewed.  Constitutional:       General: He is not in acute distress.    Appearance: He is well-developed.  HENT:     Head: Normocephalic.     Right Ear: Tympanic membrane normal.     Left Ear: Tympanic membrane normal.  Eyes:     General:        Right eye: No discharge.        Left eye: No discharge.     Pupils: Pupils are equal, round, and reactive to light.  Neck:     Thyroid: No thyromegaly.  Cardiovascular:     Rate and Rhythm: Normal rate and regular rhythm.     Heart sounds: Normal heart sounds. No murmur heard. Pulmonary:     Effort: Pulmonary effort is normal. No respiratory distress.     Breath sounds: Normal breath sounds. No wheezing.  Abdominal:     General: Bowel sounds are normal. There is no distension.     Palpations: Abdomen is soft.     Tenderness: There is no abdominal tenderness.  Musculoskeletal:        General: No tenderness. Normal range of motion.     Cervical back: Normal range of motion and neck supple.  Skin:    General: Skin is warm and dry.     Findings: No erythema or rash.  Neurological:     Mental Status: He is alert and oriented to person, place, and time.     Cranial Nerves: No cranial nerve deficit.     Deep Tendon Reflexes: Reflexes are normal and symmetric.  Psychiatric:  Behavior: Behavior normal.        Thought Content: Thought content normal.        Judgment: Judgment normal.       BP 130/73   Pulse 87   Temp (!) 97.3 F (36.3 C) (Oral)   Resp 20   Ht 5\' 9"  (1.753 m)   Wt 195 lb (88.5 kg)   SpO2 95%   BMI 28.80 kg/m      Assessment & Plan:  GERAD CORNELIO comes in today with chief complaint of Follow up depression/asthma   Diagnosis and orders addressed:  1. Essential hypertension, benign  2. Moderate persistent asthma without complication  3. Moderate episode of recurrent major depressive disorder (HCC) Will increase Wellbutrin to 300 mg from 150 mg  Continue Celexa  Stress managmet  - buPROPion (WELLBUTRIN XL) 300 MG 24 hr  tablet; Take 1 tablet (300 mg total) by mouth daily.  Dispense: 90 tablet; Refill: 1  4. GAD (generalized anxiety disorder) - buPROPion (WELLBUTRIN XL) 300 MG 24 hr tablet; Take 1 tablet (300 mg total) by mouth daily.  Dispense: 90 tablet; Refill: 1  5. Overweight (BMI 25.0-29.9)   Continue current medications  Health Maintenance reviewed Diet and exercise encouraged  Follow up plan: 3 months    10-07-2001, FNP

## 2022-08-04 ENCOUNTER — Other Ambulatory Visit: Payer: Self-pay | Admitting: Family

## 2022-08-04 DIAGNOSIS — E785 Hyperlipidemia, unspecified: Secondary | ICD-10-CM

## 2022-08-04 DIAGNOSIS — E8881 Metabolic syndrome: Secondary | ICD-10-CM

## 2022-08-04 DIAGNOSIS — I2510 Atherosclerotic heart disease of native coronary artery without angina pectoris: Secondary | ICD-10-CM

## 2022-08-04 MED ORDER — ATORVASTATIN CALCIUM 10 MG PO TABS: 10.0000 mg | ORAL_TABLET | Freq: Every day | ORAL | 2 refills | Status: DC

## 2022-08-09 ENCOUNTER — Emergency Department (HOSPITAL_COMMUNITY)
Admission: EM | Admit: 2022-08-09 | Discharge: 2022-08-10 | Discharge status: Against Medical Advice | Payer: Medicare Other | Attending: Student | Admitting: Student

## 2022-08-09 ENCOUNTER — Other Ambulatory Visit: Payer: Self-pay

## 2022-08-09 ENCOUNTER — Encounter (HOSPITAL_COMMUNITY): Payer: Self-pay | Admitting: Emergency Medicine

## 2022-08-09 ENCOUNTER — Emergency Department (HOSPITAL_COMMUNITY): Payer: Medicare Other

## 2022-08-09 DIAGNOSIS — R0789 Other chest pain: Secondary | ICD-10-CM | POA: Insufficient documentation

## 2022-08-09 DIAGNOSIS — I6381 Other cerebral infarction due to occlusion or stenosis of small artery: Secondary | ICD-10-CM | POA: Diagnosis not present

## 2022-08-09 DIAGNOSIS — M25551 Pain in right hip: Secondary | ICD-10-CM | POA: Diagnosis not present

## 2022-08-09 DIAGNOSIS — Z043 Encounter for examination and observation following other accident: Secondary | ICD-10-CM | POA: Diagnosis not present

## 2022-08-09 DIAGNOSIS — Z5321 Procedure and treatment not carried out due to patient leaving prior to being seen by health care provider: Secondary | ICD-10-CM | POA: Diagnosis not present

## 2022-08-09 DIAGNOSIS — R42 Dizziness and giddiness: Secondary | ICD-10-CM | POA: Diagnosis not present

## 2022-08-09 DIAGNOSIS — M79604 Pain in right leg: Secondary | ICD-10-CM | POA: Diagnosis not present

## 2022-08-09 LAB — CBC WITH DIFFERENTIAL/PLATELET
Abs Immature Granulocytes: 0.05 10*3/uL (ref 0.00–0.07)
Basophils Absolute: 0.1 10*3/uL (ref 0.0–0.1)
Basophils Relative: 1 %
Eosinophils Absolute: 0.1 10*3/uL (ref 0.0–0.5)
Eosinophils Relative: 1 %
HCT: 41.2 % (ref 39.0–52.0)
Hemoglobin: 13.6 g/dL (ref 13.0–17.0)
Immature Granulocytes: 0 %
Lymphocytes Relative: 11 %
Lymphs Abs: 1.5 10*3/uL (ref 0.7–4.0)
MCH: 30.5 pg (ref 26.0–34.0)
MCHC: 33 g/dL (ref 30.0–36.0)
MCV: 92.4 fL (ref 80.0–100.0)
Monocytes Absolute: 0.9 10*3/uL (ref 0.1–1.0)
Monocytes Relative: 6 %
Neutro Abs: 11.5 10*3/uL — ABNORMAL HIGH (ref 1.7–7.7)
Neutrophils Relative %: 81 %
Platelets: 285 10*3/uL (ref 150–400)
RBC: 4.46 MIL/uL (ref 4.22–5.81)
RDW: 17.1 % — ABNORMAL HIGH (ref 11.5–15.5)
WBC: 14.2 10*3/uL — ABNORMAL HIGH (ref 4.0–10.5)
nRBC: 0 % (ref 0.0–0.2)

## 2022-08-09 LAB — BASIC METABOLIC PANEL
Anion gap: 14 (ref 5–15)
BUN: 16 mg/dL (ref 8–23)
CO2: 20 mmol/L — ABNORMAL LOW (ref 22–32)
Calcium: 9.3 mg/dL (ref 8.9–10.3)
Chloride: 108 mmol/L (ref 98–111)
Creatinine, Ser: 1.36 mg/dL — ABNORMAL HIGH (ref 0.61–1.24)
GFR, Estimated: 52 mL/min — ABNORMAL LOW (ref >60)
Glucose, Bld: 138 mg/dL — ABNORMAL HIGH (ref 70–99)
Potassium: 4.1 mmol/L (ref 3.5–5.1)
Sodium: 142 mmol/L (ref 135–145)

## 2022-08-09 LAB — MAGNESIUM: Magnesium: 2.3 mg/dL (ref 1.7–2.4)

## 2022-08-09 NOTE — ED Notes (Signed)
Patient with episode of emesis. Triage RN notified

## 2022-08-09 NOTE — ED Provider Triage Note (Signed)
Emergency Medicine Provider Triage Evaluation Note  Casey Klein , a 81 y.o. male  was evaluated in triage.  Pt complains of 81 year old presents to the ER today for evaluation of fall.  Patient reports that he lost his balance and fell off of a step landing on his right side.  Patient reports pain to his right leg, right hip and right rib cage.  Patient denies any head injury.  Does not take any blood thinners.  Patient reports having dizziness that is at his baseline and unchanged however this seems to be worse after the fall tonight.  No chest pain or shortness of breath.  Patient is having difficulty time ambulating because of the dizziness and the pain.  Review of Systems  Positive: Dizziness, head injury, abrasion, right hip pain Negative: Chest pain, shortness of breath  Physical Exam  BP (!) 151/101 (BP Location: Left Arm)   Pulse 85   Temp (!) 97.3 F (36.3 C)   Resp (!) 22   SpO2 95%  Gen:   Awake, no distress   Resp:  Normal effort  MSK:   Moves extremities without difficulty  Other:  Patient has abrasion over the right posterior rib cage.  Tender to palpation without step-offs or deformities.  Lungs clear to auscultation bilaterally.  Does have pain with palpation and range of motion of the right hip and right thigh.  PT pulse are 2+.  Compartments are soft.  No evidence of intracranial trauma.  Grip strength is equal bilaterally.  Heart regular rate and rhythm.  Medical Decision Making  Medically screening exam initiated at 8:57 PM.  Appropriate orders placed.  [name] was informed that the remainder of the evaluation will be completed by another provider, this initial triage assessment does not replace that evaluation, and the importance of remaining in the ED until their evaluation is complete.  Patient reports losing his balance this evening and having a mechanical fall onto his right side.  Patient also reports worsening dizziness after the fall.  Does not believe  that he hit his head.  Did not lose consciousness.  Does not take any blood thinners.  No focal neurological deficit appreciated.  Abrasion noted to the right flank.  No focal abdominal tenderness.  X-ray imaging is pending at this time.  We will also obtain CT imaging to rule intracranial abnormality given patient's dizziness although he reports not hitting his head.  Patient would benefit from basic labs to assess for any electrolyte abnormality.  Patient denies any chest pain or shortness of breath no indication for troponin but will obtain EKG to make sure patient does not have any cardiac arrhythmias.   Rise Mu, PA-C 08/09/22 2100

## 2022-08-09 NOTE — ED Triage Notes (Signed)
Patient lost his balance and fell at home this evening , denies LOC/no head injury , he does not take anticoagulant , ambulatory , reports pain at right side of torso and right upper leg .

## 2022-08-10 NOTE — ED Notes (Signed)
Patient decided to leave.   

## 2022-08-11 ENCOUNTER — Encounter: Payer: Self-pay | Admitting: Family Medicine

## 2022-08-11 ENCOUNTER — Ambulatory Visit (INDEPENDENT_AMBULATORY_CARE_PROVIDER_SITE_OTHER): Payer: Medicare Other | Admitting: Family Medicine

## 2022-08-11 VITALS — BP 107/59 | HR 94 | Temp 96.4°F

## 2022-08-11 DIAGNOSIS — R6883 Chills (without fever): Secondary | ICD-10-CM

## 2022-08-11 DIAGNOSIS — D72822 Plasmacytosis: Secondary | ICD-10-CM | POA: Diagnosis not present

## 2022-08-11 MED ORDER — DOXYCYCLINE HYCLATE 100 MG PO CAPS: 100.0000 mg | ORAL_CAPSULE | Freq: Two times a day (BID) | ORAL | 0 refills | Status: DC

## 2022-08-11 NOTE — Progress Notes (Unsigned)
Subjective:  Patient ID: Casey Klein, male    DOB: 1941-08-23  Age: 81 y.o. MRN: [id number]  CC: ER FOLLOW UP and Fall   HPI Casey Klein presents for concerned about results of xray after falling. Stepped off the porch and lost his balance 2 days ago. Still having right hip pain, although films were negative, including Ct head for bleed.   Having cold sweats a couple of times recently.     08/11/2022    1:46 PM 07/29/2022   10:02 AM 06/28/2022   10:04 AM  Depression screen PHQ 2/9  Decreased Interest 0 2 1  Down, Depressed, Hopeless 0 1 1  PHQ - 2 Score 0 3 2  Altered sleeping  1 3  Tired, decreased energy  1 2  Change in appetite  1 0  Feeling bad or failure about yourself   1 0  Trouble concentrating  1 0  Moving slowly or fidgety/restless  1 0  Suicidal thoughts  1 0  PHQ-9 Score  10 7  Difficult doing work/chores   Somewhat difficult    History Casey Klein has a past medical history of Allergy, Asthma, CAD (coronary artery disease), Carotid stenosis, Cataract, History of kidney stones, HTN (hypertension), and PAT (paroxysmal atrial tachycardia) (HCC).   He has a past surgical history that includes Cervical disc surgery; Back surgery; Cataract extraction w/PHACO (Right, 05/06/2013); Cataract extraction w/PHACO (Left, 06/24/2013); and Eye surgery.   His family history is not on file.He reports that he has quit smoking. His smoking use included cigarettes. He started smoking about 39 years ago. He has a 10.00 pack-year smoking history. He has never used smokeless tobacco. He reports that he does not drink alcohol and does not use drugs.    ROS Review of Systems  Constitutional:  Negative for fever.  Respiratory:  Negative for shortness of breath.   Cardiovascular:  Negative for chest pain.  Musculoskeletal:  Negative for arthralgias.  Skin:  Negative for rash.    Objective:  BP (!) 107/59   Pulse 94   Temp (!) 96.4 F (35.8 C)   SpO2 94%   BP Readings from Last  3 Encounters:  08/11/22 (!) 107/59  08/10/22 137/80  07/29/22 130/73    Wt Readings from Last 3 Encounters:  07/29/22 195 lb (88.5 kg)  06/28/22 198 lb 12.8 oz (90.2 kg)  03/29/22 198 lb 9.6 oz (90.1 kg)     Physical Exam Vitals reviewed.  Constitutional:      Appearance: He is well-developed.  HENT:     Head: Normocephalic and atraumatic.     Right Ear: External ear normal.     Left Ear: External ear normal.     Mouth/Throat:     Pharynx: No oropharyngeal exudate or posterior oropharyngeal erythema.  Eyes:     Pupils: Pupils are equal, round, and reactive to light.  Cardiovascular:     Rate and Rhythm: Normal rate and regular rhythm.     Heart sounds: No murmur heard. Pulmonary:     Effort: No respiratory distress.     Breath sounds: Normal breath sounds.  Musculoskeletal:     Cervical back: Normal range of motion and neck supple.  Neurological:     Mental Status: He is alert and oriented to person, place, and time.     Assessment & Plan:   Casey Klein was seen today for er follow up and fall.  Diagnoses and all orders for this visit:  Chills -  Urinalysis -     Urine Culture  Plasmacytosis -     Urinalysis -     Urine Culture  Other orders -     doxycycline (VIBRAMYCIN) 100 MG capsule; Take 1 capsule (100 mg total) by mouth 2 (two) times daily.       I am having Casey Klein start on doxycycline. I am also having him maintain his aspirin EC, montelukast, albuterol, Vitamin D (Ergocalciferol), cetirizine, fluticasone, citalopram, meloxicam, amLODipine, budesonide-formoterol, lisinopril, buPROPion, and atorvastatin.  Allergies as of 08/11/2022       Reactions   Pneumococcal Vaccines    Pt states had pneumococcal vaccine at [facility] on 09-06-13 and arm swelled "huge with a lot swelling"    Penicillins Other (See Comments)   unknown        Medication List        Accurate as of August 11, 2022 11:59 PM. If you have any questions, ask your nurse  or doctor.          albuterol 108 (90 Base) MCG/ACT inhaler Commonly known as: VENTOLIN HFA Inhale 2 puffs into the lungs every 6 (six) hours as needed for wheezing or shortness of breath.   amLODipine 10 MG tablet Commonly known as: NORVASC TAKE 1 TABLET BY MOUTH EVERY DAY   aspirin EC 81 MG tablet Take 1 tablet (81 mg total) by mouth daily.   atorvastatin 10 MG tablet Commonly known as: LIPITOR Take 1 tablet (10 mg total) by mouth daily.   budesonide-formoterol 160-4.5 MCG/ACT inhaler Commonly known as: SYMBICORT Inhale 2 puffs into the lungs 2 (two) times daily.   buPROPion 300 MG 24 hr tablet Commonly known as: Wellbutrin XL Take 1 tablet (300 mg total) by mouth daily.   cetirizine 10 MG tablet Commonly known as: ZYRTEC Take 1 tablet (10 mg total) by mouth daily.   citalopram 40 MG tablet Commonly known as: CELEXA TAKE 1 TABLET BY MOUTH EVERY DAY   doxycycline 100 MG capsule Commonly known as: Vibramycin Take 1 capsule (100 mg total) by mouth 2 (two) times daily. Started by: Casey Claude, MD   fluticasone 50 MCG/ACT nasal spray Commonly known as: FLONASE Place 2 sprays into both nostrils daily.   lisinopril 10 MG tablet Commonly known as: ZESTRIL TAKE 1 TABLET BY MOUTH EVERY DAY   meloxicam 15 MG tablet Commonly known as: MOBIC TAKE 1 TABLET BY MOUTH EVERY DAY   montelukast 10 MG tablet Commonly known as: SINGULAIR TAKE 1 TABLET BY MOUTH EVERYDAY AT BEDTIME   Vitamin D (Ergocalciferol) 1.25 MG (50000 UNIT) Caps capsule Commonly known as: DRISDOL Take 1 capsule (50,000 Units total) by mouth every 7 (seven) days.         Follow-up: Return in about 1 month (around 09/10/2022), or with [facility].  Casey Klein, M.D.

## 2022-08-14 ENCOUNTER — Encounter: Payer: Self-pay | Admitting: Family Medicine

## 2022-09-07 ENCOUNTER — Telehealth: Payer: Self-pay | Admitting: Family

## 2022-09-07 NOTE — Telephone Encounter (Signed)
Appointment scheduled.

## 2022-09-08 ENCOUNTER — Ambulatory Visit (INDEPENDENT_AMBULATORY_CARE_PROVIDER_SITE_OTHER): Payer: Medicare Other | Admitting: Family

## 2022-09-08 ENCOUNTER — Encounter: Payer: Self-pay | Admitting: Family

## 2022-09-08 VITALS — BP 134/82 | HR 84 | Temp 96.8°F | Ht 69.0 in

## 2022-09-08 DIAGNOSIS — E559 Vitamin D deficiency, unspecified: Secondary | ICD-10-CM

## 2022-09-08 DIAGNOSIS — R251 Tremor, unspecified: Secondary | ICD-10-CM

## 2022-09-08 DIAGNOSIS — R531 Weakness: Secondary | ICD-10-CM

## 2022-09-08 DIAGNOSIS — R4020 Unspecified coma: Secondary | ICD-10-CM

## 2022-09-08 DIAGNOSIS — R296 Repeated falls: Secondary | ICD-10-CM | POA: Diagnosis not present

## 2022-09-08 MED ORDER — VITAMIN D (ERGOCALCIFEROL) 1.25 MG (50000 UNIT) PO CAPS: 50000.0000 [IU] | ORAL_CAPSULE | ORAL | 3 refills | Status: DC

## 2022-09-08 NOTE — Patient Instructions (Signed)
Syncope, Adult  Syncope refers to a condition in which a person temporarily loses consciousness. Syncope may also be called fainting or passing out. It is caused by a sudden decrease in blood flow to the brain. This can happen for a variety of reasons. Most causes of syncope are not dangerous. It can be triggered by things such as needle sticks, seeing blood, pain, or intense emotion. However, syncope can also be a sign of a serious medical problem, such as a heart abnormality. Other causes can include dehydration, migraines, or taking medicines that lower blood pressure. Your health care provider may do tests to find the reason why you are having syncope. If you faint, get medical help right away. Call your local emergency services (911 in the [place] Follow these instructions at home: Pay attention to any changes in your symptoms. Take these actions to stay safe and to help relieve your symptoms: Knowing when you may be about to faint Signs that you may be about to faint include: Feeling dizzy, weak, light-headed, or like the room is spinning. Feeling nauseous. Seeing spots or seeing all white or all black in your field of vision. Having cold, clammy skin or feeling warm and sweaty. Hearing ringing in the ears (tinnitus). If you start to feel like you might faint, sit or lie down right away. If sitting, put your head down between your legs. If lying down, raise (elevate) your feet above the level of your heart. Breathe deeply and steadily. Wait until all the symptoms have passed. Have someone stay with you until you feel stable. Medicines Take over-the-counter and prescription medicines only as told by your health care provider. If you are taking blood pressure or heart medicine, get up slowly and take several minutes to sit and then stand. This can reduce dizziness and decrease the risk of syncope. Lifestyle Do not drive, use machinery, or play sports until your health care provider says it is  okay. Do not drink alcohol. Do not use any products that contain nicotine or tobacco. These products include cigarettes, chewing tobacco, and vaping devices, such as e-cigarettes. If you need help quitting, ask your health care provider. Avoid hot tubs and saunas. General instructions Talk with your health care provider about your symptoms. You may need to have testing to understand the cause of your syncope. Drink enough fluid to keep your urine pale yellow. Avoid prolonged standing. If you must stand for a long time, do movements such as: Moving your legs. Crossing your legs. Flexing and stretching your leg muscles. Squatting. Keep all follow-up visits. This is important. Contact a health care provider if: You have episodes of near fainting. Get help right away if: You faint. You hit your head or are injured after fainting. You have any of these symptoms that may indicate trouble with your heart: Fast or irregular heartbeats (palpitations). Unusual pain in your chest, abdomen, or back. Shortness of breath. You have a seizure. You have a severe headache. You are confused. You have vision problems. You have severe weakness or trouble walking. You are bleeding from your mouth or rectum, or you have black or tarry stool. These symptoms may represent a serious problem that is an emergency. Do not wait to see if your symptoms will go away. Get medical help right away. Call your local emergency services (911 in the [place] Do not drive yourself to the hospital. Summary Syncope refers to a condition in which a person temporarily loses consciousness. Syncope may also be called   fainting or passing out. It is caused by a sudden decrease in blood flow to the brain. Signs that you may be about to faint include dizziness, feeling light-headed, feeling nauseous, sudden vision changes, or cold, clammy skin. Even though most causes of syncope are not dangerous, syncope can be a sign of a serious  medical problem. Get help right away if you faint. If you start to feel like you might faint, sit or lie down right away. If sitting, put your head down between your legs. If lying down, raise (elevate) your feet above the level of your heart. This information is not intended to replace advice given to you by your health care provider. Make sure you discuss any questions you have with your health care provider. Document Revised: 04/01/2021 Document Reviewed: 04/01/2021 Elsevier Patient Education  2023 Elsevier Inc[employer]  

## 2022-09-08 NOTE — Progress Notes (Signed)
Subjective:    Patient ID: Casey Klein, male    DOB: [birth year], 81 y.o.   MRN: [id number]  Chief Complaint  Patient presents with   Loss of Consciousness   Fall   Dizziness   Pt presents to the office today with dizziness and syncope.   He fell on 08/09/22 and went to the ED. He had a rib fracture of a right eighth rib. Negative hip and femur x-ray. Had a CT head for acute intracranial bleed.  Pt's friend states when he stands he starts shaking and then will fall.  Loss of Consciousness This is a recurrent problem. The current episode started 1 to 4 weeks ago. He lost consciousness for a period of less than 1 minute. The symptoms are aggravated by standing. Associated symptoms include clumsiness, dizziness, focal weakness, light-headedness, malaise/fatigue and weakness. He has tried bed rest for the symptoms. The treatment provided mild relief.  Fall  Dizziness Associated symptoms include weakness.      Review of Systems  Constitutional:  Positive for malaise/fatigue.  Cardiovascular:  Positive for syncope.  Neurological:  Positive for dizziness, focal weakness, weakness and light-headedness.  All other systems reviewed and are negative.      Objective:   Physical Exam Vitals reviewed.  Constitutional:      General: He is not in acute distress.    Appearance: He is well-developed.  HENT:     Head: Normocephalic.  Eyes:     General:        Right eye: No discharge.        Left eye: No discharge.     Pupils: Pupils are equal, round, and reactive to light.  Neck:     Thyroid: No thyromegaly.  Cardiovascular:     Rate and Rhythm: Normal rate and regular rhythm.     Heart sounds: Normal heart sounds. No murmur heard. Pulmonary:     Effort: Pulmonary effort is normal. No respiratory distress.     Breath sounds: Normal breath sounds. No wheezing.  Abdominal:     General: Bowel sounds are normal. There is no distension.     Palpations: Abdomen is soft.      Tenderness: There is no abdominal tenderness.  Musculoskeletal:        General: No tenderness. Normal range of motion.     Cervical back: Normal range of motion and neck supple.  Skin:    General: Skin is warm and dry.     Findings: No erythema or rash.  Neurological:     Mental Status: He is alert and oriented to person, place, and time.     Cranial Nerves: No cranial nerve deficit.     Motor: Weakness (generalized weakness) present.     Gait: Gait abnormal.     Deep Tendon Reflexes: Reflexes are normal and symmetric.     Comments: Essential tremor noted  Psychiatric:        Behavior: Behavior normal.        Thought Content: Thought content normal.        Judgment: Judgment normal.       BP 134/82   Pulse 84   Temp (!) 96.8 F (36 C) (Temporal)   Ht 5' 9"  (1.753 m)   SpO2 94%   BMI 28.80 kg/m      Assessment & Plan:  Casey Klein comes in today with chief complaint of Loss of Consciousness, Fall, and Dizziness   Diagnosis and orders addressed:  1. Vitamin  D deficiency - Vitamin D, Ergocalciferol, (DRISDOL) 1.25 MG (50000 UNIT) CAPS capsule; Take 1 capsule (50,000 Units total) by mouth every 7 (seven) days.  Dispense: 12 capsule; Refill: 3 - CMP14+EGFR - CBC with Differential/Platelet  2. Loss of consciousness ([place]) - EKG 12-Lead - Ambulatory referral to Neurology - CMP14+EGFR - CBC with Differential/Platelet - TSH  3. Tremor of both hands - Ambulatory referral to Neurology - CMP14+EGFR - CBC with Differential/Platelet - TSH  4. Frequent falls - Ambulatory referral to Neurology - Ambulatory referral to [facility] with Differential/Platelet  5. General weakness - Ambulatory referral to Neurology - Urinalysis, Complete - CMP14+EGFR - CBC with Differential/Platelet - TSH - Urine Culture   Labs pending Referral to PT for frequent falls Long discussion on fall preventions  Referral to Neurologists  Health Maintenance  reviewed Diet and exercise encouraged RTO in 2 weeks     Evelina Dun, FNP

## 2022-09-09 ENCOUNTER — Other Ambulatory Visit: Payer: Self-pay | Admitting: Family Medicine

## 2022-09-09 ENCOUNTER — Other Ambulatory Visit: Payer: Medicare Other

## 2022-09-09 DIAGNOSIS — R4020 Unspecified coma: Secondary | ICD-10-CM

## 2022-09-09 DIAGNOSIS — R531 Weakness: Secondary | ICD-10-CM | POA: Diagnosis not present

## 2022-09-09 DIAGNOSIS — R6889 Other general symptoms and signs: Secondary | ICD-10-CM | POA: Diagnosis not present

## 2022-09-09 LAB — TSH: TSH: 1.77 u[IU]/mL (ref 0.450–4.500)

## 2022-09-09 LAB — MICROSCOPIC EXAMINATION: Renal Epithel, UA: NONE SEEN /hpf

## 2022-09-09 LAB — CBC WITH DIFFERENTIAL/PLATELET
Basophils Absolute: 0.1 10*3/uL (ref 0.0–0.2)
Basos: 1 %
EOS (ABSOLUTE): 0.4 10*3/uL (ref 0.0–0.4)
Eos: 4 %
Hematocrit: 34.6 % — ABNORMAL LOW (ref 37.5–51.0)
Hemoglobin: 11.1 g/dL — ABNORMAL LOW (ref 13.0–17.7)
Immature Grans (Abs): 0 10*3/uL (ref 0.0–0.1)
Immature Granulocytes: 0 %
Lymphocytes Absolute: 1.7 10*3/uL (ref 0.7–3.1)
Lymphs: 18 %
MCH: 29.6 pg (ref 26.6–33.0)
MCHC: 32.1 g/dL (ref 31.5–35.7)
MCV: 92 fL (ref 79–97)
Monocytes Absolute: 1.1 10*3/uL — ABNORMAL HIGH (ref 0.1–0.9)
Monocytes: 12 %
Neutrophils Absolute: 6.3 10*3/uL (ref 1.4–7.0)
Neutrophils: 65 %
Platelets: 300 10*3/uL (ref 150–450)
RBC: 3.75 x10E6/uL — ABNORMAL LOW (ref 4.14–5.80)
RDW: 16.9 % — ABNORMAL HIGH (ref 11.6–15.4)
WBC: 9.6 10*3/uL (ref 3.4–10.8)

## 2022-09-09 LAB — CMP14+EGFR
ALT: 15 IU/L (ref 0–44)
AST: 16 IU/L (ref 0–40)
Albumin/Globulin Ratio: 2.1 (ref 1.2–2.2)
Albumin: 4.6 g/dL (ref 3.7–4.7)
Alkaline Phosphatase: 66 IU/L (ref 44–121)
BUN/Creatinine Ratio: 11 (ref 10–24)
BUN: 14 mg/dL (ref 8–27)
Bilirubin Total: 0.5 mg/dL (ref 0.0–1.2)
CO2: 21 mmol/L (ref 20–29)
Calcium: 8.6 mg/dL (ref 8.6–10.2)
Chloride: 102 mmol/L (ref 96–106)
Creatinine, Ser: 1.25 mg/dL (ref 0.76–1.27)
Globulin, Total: 2.2 g/dL (ref 1.5–4.5)
Glucose: 98 mg/dL (ref 70–99)
Potassium: 3.8 mmol/L (ref 3.5–5.2)
Sodium: 141 mmol/L (ref 134–144)
Total Protein: 6.8 g/dL (ref 6.0–8.5)
eGFR: 57 mL/min/{1.73_m2} — ABNORMAL LOW (ref >59)

## 2022-09-09 LAB — URINALYSIS, COMPLETE
Bilirubin, UA: NEGATIVE
Glucose, UA: NEGATIVE
Ketones, UA: NEGATIVE
Leukocytes,UA: NEGATIVE
Nitrite, UA: NEGATIVE
Specific Gravity, UA: >1.03 — ABNORMAL HIGH (ref 1.005–1.030)
Urobilinogen, Ur: 0.2 mg/dL (ref 0.2–1.0)
pH, UA: 5 (ref 5.0–7.5)

## 2022-09-10 LAB — VITAMIN B12: Vitamin B-12: 273 pg/mL (ref 232–1245)

## 2022-09-10 LAB — IRON: Iron: 32 ug/dL — ABNORMAL LOW (ref 38–169)

## 2022-09-11 LAB — URINE CULTURE

## 2022-09-14 ENCOUNTER — Other Ambulatory Visit: Payer: Self-pay | Admitting: Family

## 2022-09-14 DIAGNOSIS — F411 Generalized anxiety disorder: Secondary | ICD-10-CM

## 2022-09-14 DIAGNOSIS — F331 Major depressive disorder, recurrent, moderate: Secondary | ICD-10-CM

## 2022-09-14 DIAGNOSIS — M1990 Unspecified osteoarthritis, unspecified site: Secondary | ICD-10-CM

## 2022-09-14 DIAGNOSIS — I10 Essential (primary) hypertension: Secondary | ICD-10-CM

## 2022-09-27 ENCOUNTER — Ambulatory Visit (INDEPENDENT_AMBULATORY_CARE_PROVIDER_SITE_OTHER): Payer: Medicare Other

## 2022-09-27 ENCOUNTER — Encounter: Payer: Self-pay | Admitting: Nurse Practitioner

## 2022-09-27 ENCOUNTER — Other Ambulatory Visit: Payer: Self-pay | Admitting: General Practice

## 2022-09-27 ENCOUNTER — Ambulatory Visit (INDEPENDENT_AMBULATORY_CARE_PROVIDER_SITE_OTHER): Payer: Medicare Other | Admitting: Nurse Practitioner

## 2022-09-27 VITALS — BP 100/63 | HR 90 | Temp 97.8°F | Ht 69.0 in

## 2022-09-27 DIAGNOSIS — M79605 Pain in left leg: Secondary | ICD-10-CM

## 2022-09-27 DIAGNOSIS — E860 Dehydration: Secondary | ICD-10-CM

## 2022-09-27 DIAGNOSIS — R531 Weakness: Secondary | ICD-10-CM

## 2022-09-27 MED ORDER — DICLOFENAC SODIUM 1 % EX GEL: 2.0000 g | Freq: Four times a day (QID) | CUTANEOUS | 1 refills | Status: DC

## 2022-09-27 NOTE — Patient Instructions (Signed)
Preventing Falls and Fractures  Falls can be very serious, especially for older adults or people with osteoporosis  Falls can be caused by: Tripping or slipping Slow reflexes Balance problems Reduced muscle strength Poor vision or a recent change in prescription Illness and some medications (especially blood pressure pills, diuretics, heart medicines, muscle relaxants and sleep medications) Drinking alcohol  To prevent falls outdoors: Use a can or walker if needed Wear rubber-soled shoes so you don't slip DO NOT buy "shape up" shoes with rocker bottom soles if you have balance problems.  The thick soles and shape make it more difficult to keep your balance. Put kitty litter or salt on icy sidewalks Walk on the grass if the sidewalks are slick Avoid walking on uneven ground whenever possible  T prevent falls indoors: Keep rooms clutter-free, especially hallways, stairs and paths to light switches Remove throw rugs Install night lights, especially to and in the bathroom Turn on lights before going downstairs Keep a flashlight next to your bed Buy a cordless phone to keep with you instead of jumping up to answer the phone Install grab bars in the bathroom near the shower and toilet Install rails on both sides of the stairs.  Make sure the stairs are well lit Wear slippers with non-skid soles.  Do not walk around in stockings or socks  Balance problems and dizziness are not a normal part of growing older.  If you begin having balance problems or dizziness see your doctor.  Physical Therapy can help you with many balance problems, strengthening hip and leg muscles and with gait training.  To keep your bones healthy make sure you are getting enough calcium and Vitamin D each day.  Ask your doctor or pharmacist about supplements.  Regular weight-bearing exercise like walking, lifting weights or dancing can help strengthen bones and prevent osteoporosis. Dehydration, Adult Dehydration is  condition in which there is not enough water or other fluids in the body. This happens when a person loses more fluids than he or she takes in. Important body parts cannot work right without the right amount of fluids. Any loss of fluids from the body can cause dehydration. Dehydration can be mild, worse, or very bad. It should be treated right away to keep it from getting very bad. What are the causes? This condition may be caused by: Conditions that cause loss of water or other fluids, such as: Watery poop (diarrhea). Vomiting. Sweating a lot. Peeing (urinating) a lot. Not drinking enough fluids, especially when you: Are ill. Are doing things that take a lot of energy to do. Other illnesses and conditions, such as fever or infection. Certain medicines, such as medicines that take extra fluid out of the body (diuretics). Lack of safe drinking water. Not being able to get enough water and food. What increases the risk? The following factors may make you more likely to develop this condition: Having a long-term (chronic) illness that has not been treated the right way, such as: Diabetes. Heart disease. Kidney disease. Being 20 years of age or older. Having a disability. Living in a place that is high above the ground or sea (high in altitude). The thinner, dried air causes more fluid loss. Doing exercises that put stress on your body for a long time. What are the signs or symptoms? Symptoms of dehydration depend on how bad it is. Mild or worse dehydration Thirst. Dry lips or dry mouth. Feeling dizzy or light-headed, especially when you stand up from sitting. Muscle  cramps. Your body making: Dark pee (urine). Pee may be the color of tea. Less pee than normal. Less tears than normal. Headache. Very bad dehydration Changes in skin. Skin may: Be cold to the touch (clammy). Be blotchy or pale. Not go back to normal right after you lightly pinch it and let it go. Little or no  tears, pee, or sweat. Changes in vital signs, such as: Fast breathing. Low blood pressure. Weak pulse. Pulse that is more than 100 beats a minute when you are sitting still. Other changes, such as: Feeling very thirsty. Eyes that look hollow (sunken). Cold hands and feet. Being mixed up (confused). Being very tired (lethargic) or having trouble waking from sleep. Short-term weight loss. Loss of consciousness. How is this treated? Treatment for this condition depends on how bad it is. Treatment should start right away. Do not wait until your condition gets very bad. Very bad dehydration is an emergency. You will need to go to a hospital. Mild or worse dehydration can be treated at home. You may be asked to: Drink more fluids. Drink an oral rehydration solution (ORS). This drink helps get the right amounts of fluids and salts and minerals in the blood (electrolytes). Very bad dehydration can be treated: With fluids through an IV tube. By getting normal levels of salts and minerals in your blood. This is often done by giving salts and minerals through a tube. The tube is passed through your nose and into your stomach. By treating the root cause. Follow these instructions at home: Oral rehydration solution If told by your doctor, drink an ORS: Make an ORS. Use instructions on the package. Start by drinking small amounts, about  cup (120 mL) every 5-10 minutes. Slowly drink more until you have had the amount that your doctor said to have. Eating and drinking        Drink enough clear fluid to keep your pee pale yellow. If you were told to drink an ORS, finish the ORS first. Then, start slowly drinking other clear fluids. Drink fluids such as: Water. Do not drink only water. Doing that can make the salt (sodium) level in your body get too low. Water from ice chips you suck on. Fruit juice that you have added water to (diluted). Low-calorie sports drinks. Eat foods that have the  right amounts of salts and minerals, such as: Bananas. Oranges. Potatoes. Tomatoes. Spinach. Do not drink alcohol. Avoid: Drinks that have a lot of sugar. These include: High-calorie sports drinks. Fruit juice that you did not add water to. Soda. Caffeine. Foods that are greasy or have a lot of fat or sugar. General instructions Take over-the-counter and prescription medicines only as told by your doctor. Do not take salt tablets. Doing that can make the salt level in your body get too high. Return to your normal activities as told by your doctor. Ask your doctor what activities are safe for you. Keep all follow-up visits as told by your doctor. This is important. Contact a doctor if: You have pain in your belly (abdomen) and the pain: Gets worse. Stays in one place. You have a rash. You have a stiff neck. You get angry or annoyed (irritable) more easily than normal. You are more tired or have a harder time waking than normal. You feel: Weak or dizzy. Very thirsty. Get help right away if you have: Any symptoms of very bad dehydration. Symptoms of vomiting, such as: You cannot eat or drink without vomiting. Your vomiting  gets worse or does not go away. Your vomit has blood or green stuff in it. Symptoms that get worse with treatment. A fever. A very bad headache. Problems with peeing or pooping (having a bowel movement), such as: Watery poop that gets worse or does not go away. Blood in your poop (stool). This may cause poop to look black and tarry. Not peeing in 6-8 hours. Peeing only a small amount of very dark pee in 6-8 hours. Trouble breathing. These symptoms may be an emergency. Do not wait to see if the symptoms will go away. Get medical help right away. Call your local emergency services (911 in the [place] Do not drive yourself to the hospital. Summary Dehydration is a condition in which there is not enough water or other fluids in the body. This happens when a  person loses more fluids than he or she takes in. Treatment for this condition depends on how bad it is. Treatment should be started right away. Do not wait until your condition gets very bad. Drink enough clear fluid to keep your pee pale yellow. If you were told to drink an oral rehydration solution (ORS), finish the ORS first. Then, start slowly drinking other clear fluids. Take over-the-counter and prescription medicines only as told by your doctor. Get help right away if you have any symptoms of very bad dehydration. This information is not intended to replace advice given to you by your health care provider. Make sure you discuss any questions you have with your health care provider. Document Revised: 03/30/2022 Document Reviewed: 07/04/2019 Elsevier Patient Education  2023 [employer].

## 2022-09-27 NOTE — Progress Notes (Signed)
Acute Office Visit  Subjective:     Patient ID: Casey Klein, male    DOB: 06-30-1941, 81 y.o.   MRN: [id number]  Chief Complaint  Patient presents with   Fall    Multiple falls Left leg large bruising Started small and got larger    Leg Pain  The incident occurred more than 1 week ago. The incident occurred at home. The injury mechanism was a fall. The pain is present in the left leg. Quality: weakness. The pain is moderate. The pain has been Constant since onset. Associated symptoms include an inability to bear weight. He reports no foreign bodies present. The symptoms are aggravated by movement and palpation. He has tried ice for the symptoms. The treatment provided no relief.    Review of Systems  Constitutional: Negative.  Negative for chills and fever.  HENT: Negative.    Respiratory: Negative.    Cardiovascular: Negative.   Genitourinary: Negative.   Musculoskeletal: Negative.   Skin: Negative.  Negative for itching.  All other systems reviewed and are negative.       Objective:    BP 100/63   Pulse 90   Temp 97.8 F (36.6 C)   Ht 5\' 9"  (1.753 m)   SpO2 96%   BMI 28.80 kg/m  BP Readings from Last 3 Encounters:  09/27/22 100/63  09/08/22 134/82  08/11/22 (!) 107/59      Physical Exam Vitals and nursing note reviewed.  Constitutional:      Appearance: Normal appearance.  HENT:     Head: Normocephalic.     Right Ear: External ear normal.     Left Ear: External ear normal.     Nose: Nose normal.     Mouth/Throat:     Mouth: Mucous membranes are moist.  Eyes:     Conjunctiva/sclera: Conjunctivae normal.  Cardiovascular:     Rate and Rhythm: Normal rate and regular rhythm.     Pulses: Normal pulses.     Heart sounds: Normal heart sounds.  Pulmonary:     Effort: Pulmonary effort is normal.     Breath sounds: Normal breath sounds.  Abdominal:     General: Bowel sounds are normal.  Musculoskeletal:     Left lower leg: Tenderness present. No  deformity or lacerations.       Legs:     Comments: Tenderness and weakness left leg  Skin:    General: Skin is warm.     Findings: No erythema or rash.  Neurological:     General: No focal deficit present.     Mental Status: He is alert and oriented to person, place, and time.  Psychiatric:        Behavior: Behavior normal.     No results found for any visits on 09/27/22.      Assessment & Plan:  X-ray completed results pending.  Continue Voltaren topical gel,  Fall prevention education completed. Follow up with unresolved symptoms   Problem List Items Addressed This Visit   None Visit Diagnoses     Pain of left lower extremity    -  Primary   Relevant Medications   diclofenac Sodium (VOLTAREN) 1 % GEL   Other Relevant Orders   DG Tibia/Fibula Left   Dehydration       Weakness       Pain in left leg       Relevant Orders   DG FEMUR MIN 2 VIEWS LEFT  Meds ordered this encounter  Medications   diclofenac Sodium (VOLTAREN) 1 % GEL    Sig: Apply 2 g topically 4 (four) times daily.    Dispense:  50 g    Refill:  1    Order Specific Question:   Supervising Provider    Answer:   Claretta Fraise [[id number]]    Return if symptoms worsen or fail to improve.  Ivy Lynn, NP

## 2022-09-29 ENCOUNTER — Telehealth: Payer: Self-pay | Admitting: Family

## 2022-09-30 ENCOUNTER — Other Ambulatory Visit: Payer: Self-pay | Admitting: Nurse Practitioner

## 2022-09-30 NOTE — Telephone Encounter (Signed)
Appt scheduled with [name] on 11/3

## 2022-09-30 NOTE — Telephone Encounter (Signed)
I seen patient on DOD, patient had a list of concerns that was more chronic.  please schedule patient with PCP for reevaluation of chronic disease management.  patient and son is reporting recurrent falls due to overall decline.    No evidence of acute fracture or dislocation of the femur, tibia, or fibula. No focal bone lesion or bone destruction. Bone cortex appears intact. Soft tissues are unremarkable.   IMPRESSION: Negative.  Left leg X-ray is negative. I am not sure what is causing patients pain.

## 2022-10-03 ENCOUNTER — Other Ambulatory Visit: Payer: Self-pay | Admitting: Family

## 2022-10-03 DIAGNOSIS — I10 Essential (primary) hypertension: Secondary | ICD-10-CM

## 2022-10-03 DIAGNOSIS — I2510 Atherosclerotic heart disease of native coronary artery without angina pectoris: Secondary | ICD-10-CM

## 2022-10-07 ENCOUNTER — Encounter: Payer: Self-pay | Admitting: Family

## 2022-10-07 ENCOUNTER — Telehealth: Payer: Self-pay | Admitting: Family

## 2022-10-07 ENCOUNTER — Ambulatory Visit (INDEPENDENT_AMBULATORY_CARE_PROVIDER_SITE_OTHER): Payer: Medicare Other | Admitting: Family

## 2022-10-07 VITALS — BP 110/69 | HR 61 | Temp 96.0°F | Ht 69.0 in

## 2022-10-07 DIAGNOSIS — R296 Repeated falls: Secondary | ICD-10-CM | POA: Diagnosis not present

## 2022-10-07 DIAGNOSIS — R231 Pallor: Secondary | ICD-10-CM

## 2022-10-07 DIAGNOSIS — K5900 Constipation, unspecified: Secondary | ICD-10-CM

## 2022-10-07 DIAGNOSIS — R531 Weakness: Secondary | ICD-10-CM | POA: Diagnosis not present

## 2022-10-07 DIAGNOSIS — D509 Iron deficiency anemia, unspecified: Secondary | ICD-10-CM | POA: Diagnosis not present

## 2022-10-07 DIAGNOSIS — I951 Orthostatic hypotension: Secondary | ICD-10-CM

## 2022-10-07 NOTE — Patient Instructions (Signed)
Orthostatic Hypotension Blood pressure is a measurement of how strongly, or weakly, your circulating blood is pressing against the walls of your arteries. Orthostatic hypotension is a drop in blood pressure that can happen when you change positions, such as when you go from lying down to standing. Arteries are blood vessels that carry blood from your heart throughout your body. When blood pressure is too low, you may not get enough blood to your brain or to the rest of your organs. Orthostatic hypotension can cause light-headedness, sweating, rapid heartbeat, blurred vision, and fainting. These symptoms require further investigation into the cause. What are the causes? Orthostatic hypotension can be caused by many things, including: Sudden changes in posture, such as standing up quickly after you have been sitting or lying down. Loss of blood (anemia) or loss of body fluids (dehydration). Heart problems, neurologic problems, or hormone problems. Pregnancy. Aging. The risk for this condition increases as you get older. Severe infection (sepsis). Certain medicines, such as medicines for high blood pressure or medicines that make the body lose excess fluids (diuretics). What are the signs or symptoms? Symptoms of this condition may include: Weakness, light-headedness, or dizziness. Sweating. Blurred vision. Tiredness (fatigue). Rapid heartbeat. Fainting, in severe cases. How is this diagnosed? This condition is diagnosed based on: Your symptoms and medical history. Your blood pressure measurements. Your health care provider will check your blood pressure when you are: Lying down. Sitting. Standing. A blood pressure reading is recorded as two numbers, such as "120 over 80" (or 120/80). The first ("top") number is called the systolic pressure. It is a measure of the pressure in your arteries as your heart beats. The second ("bottom") number is called the diastolic pressure. It is a measure of  the pressure in your arteries when your heart relaxes between beats. Blood pressure is measured in a unit called mmHg. Healthy blood pressure for most adults is 120/80 mmHg. Orthostatic hypotension is defined as a 20 mmHg drop in systolic pressure or a 10 mmHg drop in diastolic pressure within 3 minutes of standing. Other information or tests that may be used to diagnose orthostatic hypotension include: Your other vital signs, such as your heart rate and temperature. Blood tests. An electrocardiogram (ECG) or echocardiogram. A Holter monitor. This is a device you wear that records your heart rhythm continuously, usually for 24-48 hours. Tilt table test. For this test, you will be safely secured to a table that moves you from a lying position to an upright position. Your heart rhythm and blood pressure will be monitored during the test. How is this treated? This condition may be treated by: Changing your diet. This may involve eating more salt (sodium) or drinking more water. Changing the dosage of certain medicines you are taking that might be lowering your blood pressure. Correcting the underlying reason for the orthostatic hypotension. Wearing compression stockings. Taking medicines to raise your blood pressure. Avoiding actions that trigger symptoms. Follow these instructions at home: Medicines Take over-the-counter and prescription medicines only as told by your health care provider. Follow instructions from your health care provider about changing the dosage of your current medicines, if this applies. Do not stop or adjust any of your medicines on your own. Eating and drinking  Drink enough fluid to keep your urine pale yellow. Eat extra salt only as directed. Do not add extra salt to your diet unless advised by your health care provider. Eat frequent, small meals. Avoid standing up suddenly after eating. General instructions    Get up slowly from lying down or sitting positions. This  gives your blood pressure a chance to adjust. Avoid hot showers and excessive heat as directed by your health care provider. Engage in regular physical activity as directed by your health care provider. If you have compression stockings, wear them as told. Keep all follow-up visits. This is important. Contact a health care provider if: You have a fever for more than 2-3 days. You feel more thirsty than usual. You feel dizzy or weak. Get help right away if: You have chest pain. You have a fast or irregular heartbeat. You become sweaty or feel light-headed. You feel short of breath. You faint. You have any symptoms of a stroke. "BE FAST" is an easy way to remember the main warning signs of a stroke: B - Balance. Signs are dizziness, sudden trouble walking, or loss of balance. E - Eyes. Signs are trouble seeing or a sudden change in vision. F - Face. Signs are sudden weakness or numbness of the face, or the face or eyelid drooping on one side. A - Arms. Signs are weakness or numbness in an arm. This happens suddenly and usually on one side of the body. S - Speech. Signs are sudden trouble speaking, slurred speech, or trouble understanding what people say. T - Time. Time to call emergency services. Write down what time symptoms started. You have other signs of a stroke, such as: A sudden, severe headache with no known cause. Nausea or vomiting. Seizure. These symptoms may represent a serious problem that is an emergency. Do not wait to see if the symptoms will go away. Get medical help right away. Call your local emergency services (911 in the [place] Do not drive yourself to the hospital. Summary Orthostatic hypotension is a sudden drop in blood pressure. It can cause light-headedness, sweating, rapid heartbeat, blurred vision, and fainting. Orthostatic hypotension can be diagnosed by having your blood pressure taken while lying down, sitting, and then standing. Treatment may involve  changing your diet, wearing compression stockings, sitting up slowly, adjusting your medicines, or correcting the underlying reason for the orthostatic hypotension. Get help right away if you have chest pain, a fast or irregular heartbeat, or symptoms of a stroke. This information is not intended to replace advice given to you by your health care provider. Make sure you discuss any questions you have with your health care provider. Document Revised: 02/04/2021 Document Reviewed: 02/04/2021 Elsevier Patient Education  2023 [employer]  

## 2022-10-07 NOTE — Telephone Encounter (Signed)
Left message for pt to return call.

## 2022-10-07 NOTE — Progress Notes (Signed)
Subjective:    Patient ID: Casey Klein, male    DOB: 1941/10/04, 81 y.o.   MRN: [id number]  Chief Complaint  Patient presents with   Fall   PT presents to the office today with frequent falls and generalized weakness. He has fell several times this year and reports "passed out". He was seen on 09/08/22 and we placed a referral to Neurologist and PT. He has an appointment with Neurologists 11/07/22. Home health has not came out.   He had a CT head on 08/09/22 that showed, "1. No CT evidence for acute intracranial abnormality. 2. Mild atrophy and chronic small vessel ischemic changes of the white matter"  He had stable CMP, CBC, TSH. He did a low iron and was started on iron daily. He report dark tarry stools.   Family reports when he stands he gets light headed and dizzy.  Fall The accident occurred More than 1 week ago.  Cough Pertinent negatives include no weight loss.  Anemia Presents for follow-up visit. Symptoms include malaise/fatigue. There has been no bruising/bleeding easily, leg swelling or weight loss.  Constipation This is a new problem. The current episode started 1 to 4 weeks ago. The problem has been waxing and waning since onset. His stool frequency is 2 to 3 times per week. Pertinent negatives include no weight loss.      Review of Systems  Constitutional:  Positive for malaise/fatigue. Negative for weight loss.  Respiratory:  Positive for cough.   Gastrointestinal:  Positive for constipation.  Hematological:  Does not bruise/bleed easily.       Objective:   Physical Exam Vitals reviewed.  Constitutional:      General: He is not in acute distress.    Appearance: He is well-developed.  HENT:     Head: Normocephalic.     Right Ear: Tympanic membrane normal.     Left Ear: Tympanic membrane normal.  Eyes:     General:        Right eye: No discharge.        Left eye: No discharge.     Pupils: Pupils are equal, round, and reactive to light.  Neck:      Thyroid: No thyromegaly.  Cardiovascular:     Rate and Rhythm: Normal rate and regular rhythm.     Heart sounds: Normal heart sounds. No murmur heard. Pulmonary:     Effort: Pulmonary effort is normal. No respiratory distress.     Breath sounds: Normal breath sounds. No wheezing.  Abdominal:     General: Bowel sounds are normal. There is no distension.     Palpations: Abdomen is soft.     Tenderness: There is no abdominal tenderness.  Musculoskeletal:        General: No tenderness. Normal range of motion.     Cervical back: Normal range of motion and neck supple.  Skin:    General: Skin is warm and dry.     Coloration: Skin is pale.     Findings: No erythema or rash.  Neurological:     Mental Status: He is alert and oriented to person, place, and time.     Cranial Nerves: No cranial nerve deficit.     Motor: Weakness (generalized weakness) present.     Gait: Gait abnormal.     Deep Tendon Reflexes: Reflexes are normal and symmetric.  Psychiatric:        Behavior: Behavior normal.        Thought Content: Thought content  normal.        Judgment: Judgment normal.       BP 110/69   Pulse 61   Temp (!) 96 F (35.6 C) (Temporal)   Ht _0  (1.753 m) Comment: w/c  SpO2 96%   BMI 28.80 kg/m      Assessment & Plan:   Casey Klein comes in today with chief complaint of Fall and Cough (Last week )   Diagnosis and orders addressed:  1. Frequent falls - CMP14+EGFR  2. General weakness - Anemia Profile B - CMP14+EGFR  3. Orthostatic hypotension - Fecal occult blood, imunochemical; Future - CMP14+EGFR  4. Constipation, unspecified constipation type - CMP14+EGFR  5. Iron deficiency anemia, unspecified iron deficiency anemia type - Anemia Profile B - CMP14+EGFR  6. Pale - Anemia Profile B - Fecal occult blood, imunochemical; Future - CMP14+EGFR   Labs pending Stop Mobic 15 mg Stop Lisinopril 10 mg Will reorder [place] for PT Health Maintenance  reviewed Diet and exercise encouraged  Follow up plan: 1 week    Evelina Dun, FNP

## 2022-10-08 LAB — ANEMIA PROFILE B
Basophils Absolute: 0.1 10*3/uL (ref 0.0–0.2)
Basos: 1 %
EOS (ABSOLUTE): 0.1 10*3/uL (ref 0.0–0.4)
Eos: 1 %
Ferritin: 68 ng/mL (ref 30–400)
Folate: 3.4 ng/mL (ref >3.0)
Hematocrit: 31.7 % — ABNORMAL LOW (ref 37.5–51.0)
Hemoglobin: 9.8 g/dL — ABNORMAL LOW (ref 13.0–17.7)
Immature Grans (Abs): 0.3 10*3/uL — ABNORMAL HIGH (ref 0.0–0.1)
Immature Granulocytes: 2 %
Iron Saturation: 23 % (ref 15–55)
Iron: 83 ug/dL (ref 38–169)
Lymphocytes Absolute: 1.6 10*3/uL (ref 0.7–3.1)
Lymphs: 12 %
MCH: 29.3 pg (ref 26.6–33.0)
MCHC: 30.9 g/dL — ABNORMAL LOW (ref 31.5–35.7)
MCV: 95 fL (ref 79–97)
Monocytes Absolute: 1.2 10*3/uL — ABNORMAL HIGH (ref 0.1–0.9)
Monocytes: 9 %
Neutrophils Absolute: 10.6 10*3/uL — ABNORMAL HIGH (ref 1.4–7.0)
Neutrophils: 75 %
Platelets: 381 10*3/uL (ref 150–450)
RBC: 3.35 x10E6/uL — ABNORMAL LOW (ref 4.14–5.80)
RDW: 17.4 % — ABNORMAL HIGH (ref 11.6–15.4)
Retic Ct Pct: 4.4 % — ABNORMAL HIGH (ref 0.6–2.6)
Total Iron Binding Capacity: 355 ug/dL (ref 250–450)
UIBC: 272 ug/dL (ref 111–343)
Vitamin B-12: 266 pg/mL (ref 232–1245)
WBC: 13.9 10*3/uL — ABNORMAL HIGH (ref 3.4–10.8)

## 2022-10-08 LAB — CMP14+EGFR
ALT: 16 IU/L (ref 0–44)
AST: 15 IU/L (ref 0–40)
Albumin/Globulin Ratio: 1.8 (ref 1.2–2.2)
Albumin: 4.2 g/dL (ref 3.7–4.7)
Alkaline Phosphatase: 57 IU/L (ref 44–121)
BUN/Creatinine Ratio: 21 (ref 10–24)
BUN: 24 mg/dL (ref 8–27)
Bilirubin Total: 0.7 mg/dL (ref 0.0–1.2)
CO2: 21 mmol/L (ref 20–29)
Calcium: 8.8 mg/dL (ref 8.6–10.2)
Chloride: 106 mmol/L (ref 96–106)
Creatinine, Ser: 1.13 mg/dL (ref 0.76–1.27)
Globulin, Total: 2.3 g/dL (ref 1.5–4.5)
Glucose: 98 mg/dL (ref 70–99)
Potassium: 3.9 mmol/L (ref 3.5–5.2)
Sodium: 141 mmol/L (ref 134–144)
Total Protein: 6.5 g/dL (ref 6.0–8.5)
eGFR: 65 mL/min/{1.73_m2} (ref >59)

## 2022-10-10 ENCOUNTER — Other Ambulatory Visit: Payer: Self-pay | Admitting: Family Medicine

## 2022-10-10 ENCOUNTER — Other Ambulatory Visit: Payer: Self-pay | Admitting: Family

## 2022-10-10 ENCOUNTER — Ambulatory Visit (INDEPENDENT_AMBULATORY_CARE_PROVIDER_SITE_OTHER): Payer: Medicare Other

## 2022-10-10 DIAGNOSIS — K5900 Constipation, unspecified: Secondary | ICD-10-CM

## 2022-10-10 DIAGNOSIS — R059 Cough, unspecified: Secondary | ICD-10-CM | POA: Diagnosis not present

## 2022-10-10 DIAGNOSIS — R531 Weakness: Secondary | ICD-10-CM | POA: Diagnosis not present

## 2022-10-10 DIAGNOSIS — R051 Acute cough: Secondary | ICD-10-CM

## 2022-10-10 DIAGNOSIS — D72829 Elevated white blood cell count, unspecified: Secondary | ICD-10-CM | POA: Diagnosis not present

## 2022-10-10 NOTE — Telephone Encounter (Signed)
Appt sched on 11/10

## 2022-10-14 ENCOUNTER — Ambulatory Visit (INDEPENDENT_AMBULATORY_CARE_PROVIDER_SITE_OTHER): Payer: Medicare Other | Admitting: Family

## 2022-10-14 ENCOUNTER — Encounter: Payer: Self-pay | Admitting: Family

## 2022-10-14 VITALS — BP 114/70 | HR 95 | Temp 97.1°F | Ht 69.0 in

## 2022-10-14 DIAGNOSIS — K5900 Constipation, unspecified: Secondary | ICD-10-CM

## 2022-10-14 DIAGNOSIS — R296 Repeated falls: Secondary | ICD-10-CM

## 2022-10-14 DIAGNOSIS — R5383 Other fatigue: Secondary | ICD-10-CM | POA: Diagnosis not present

## 2022-10-14 DIAGNOSIS — R231 Pallor: Secondary | ICD-10-CM | POA: Diagnosis not present

## 2022-10-14 DIAGNOSIS — R531 Weakness: Secondary | ICD-10-CM | POA: Diagnosis not present

## 2022-10-14 NOTE — Progress Notes (Signed)
   Subjective:    Patient ID: Casey Klein, male    DOB: 01-29-1941, 81 y.o.   MRN: [id number]  Chief Complaint  Patient presents with   Follow-up   Pt presents to the office today for follow up on frequent falls, weakness, and hypotension.   We stopped his mobic 15 mg and lisinopril 10 mg. We ordered home health.   Continues to feel fatigue.  He had a negative chest x-ray and KUB.  Constipation This is a new problem. The current episode started more than 1 month ago. The problem has been waxing and waning since onset. He has tried laxatives for the symptoms. The treatment provided moderate relief.      Review of Systems  Gastrointestinal:  Positive for constipation.  All other systems reviewed and are negative.      Objective:   Physical Exam Vitals reviewed.  Constitutional:      General: He is not in acute distress.    Appearance: He is well-developed.  HENT:     Head: Normocephalic.  Eyes:     General:        Right eye: No discharge.        Left eye: No discharge.     Pupils: Pupils are equal, round, and reactive to light.  Neck:     Thyroid: No thyromegaly.  Cardiovascular:     Rate and Rhythm: Normal rate and regular rhythm.     Heart sounds: Normal heart sounds. No murmur heard. Pulmonary:     Effort: Pulmonary effort is normal. No respiratory distress.     Breath sounds: Normal breath sounds. No wheezing.  Abdominal:     General: Bowel sounds are normal. There is no distension.     Palpations: Abdomen is soft.     Tenderness: There is no abdominal tenderness.  Musculoskeletal:        General: No tenderness. Normal range of motion.     Cervical back: Normal range of motion and neck supple.  Skin:    General: Skin is warm and dry.     Coloration: Skin is pale.     Findings: No erythema or rash.  Neurological:     Mental Status: He is alert and oriented to person, place, and time.     Cranial Nerves: No cranial nerve deficit.     Motor: Weakness  (generalized weakness, in wheelchair) present.     Deep Tendon Reflexes: Reflexes are normal and symmetric.  Psychiatric:        Behavior: Behavior normal.        Thought Content: Thought content normal.        Judgment: Judgment normal.      BP 114/70   Pulse 95   Temp (!) 97.1 F (36.2 C) (Temporal)   Ht _0  (1.753 m)   SpO2 97%   BMI 28.80 kg/m       Assessment & Plan:  JIOVANNY BURDELL comes in today with chief complaint of Follow-up   Diagnosis and orders addressed:  1. Weakness - CMP14+EGFR - CBC with Differential/Platelet  2. Constipation, unspecified constipation type - CMP14+EGFR - CBC with Differential/Platelet  3. Other fatigue - CMP14+EGFR - CBC with Differential/Platelet  4. Frequent falls - CMP14+EGFR - CBC with Differential/Platelet  5. Pale - CMP14+EGFR - CBC with Differential/Platelet   Labs pending Health Maintenance reviewed Diet and exercise encouraged  Follow up plan: 1 month    Evelina Dun, FNP

## 2022-10-14 NOTE — Patient Instructions (Signed)
Weakness Weakness is a lack of strength. You may feel weak all over your body (generalized), or you may feel weak in one part of your body (focal). Common causes of weakness include: Infection and disorders of the body's defense system (immune system). Physical exhaustion. Internal bleeding or other blood loss that results in a lack of red blood cells (anemia). Dehydration. An imbalance in mineral (electrolyte) levels, such as potassium. Chronic kidney or liver disease. Cancer. Other causes include: Some medicines or cancer treatment. Stress, anxiety, or depression. Heart disease, circulation problems, or stroke. Nervous system disorders. Thyroid disorders. Loss of muscle strength because of age or inactivity. Poor sleep quality or sleep disorders. The cause of your weakness may not be known. Some causes of weakness can be serious, so it is important to see your health care provider. Follow these instructions at home: Activity Rest as needed. Try to get enough sleep. Most adults need 7-8 hours of quality sleep each night. Talk to your health care provider about how much sleep you need. Do exercises, such as arm curls and leg raises, for 30 minutes at least 2 days a week or as told by your health care provider. This helps build muscle strength. Consider working with a physical therapist or trainer who can develop an exercise plan to help you gain muscle strength. General instructions  Take over-the-counter and prescription medicines only as told by your health care provider. Eat a healthy, well-balanced diet. This includes: Proteins to build muscles, such as lean meats and fish. Fresh fruits and vegetables. Carbohydrates to boost energy, such as whole grains. Drink enough fluid to keep your urine pale yellow. Keep all follow-up visits. This is important. Contact a health care provider if: Your weakness does not improve or gets worse. Your weakness affects your ability to think  clearly. Your weakness affects your ability to do your normal daily activities. Get help right away if: You develop sudden weakness, especially on one side of your face or body. You have chest pain. You have trouble breathing or shortness of breath. You have problems with your vision. You have trouble talking or swallowing. You have trouble standing or walking. You are light-headed or lose consciousness. These symptoms may be an emergency. Get help right away. Call 911. Do not wait to see if the symptoms will go away. Do not drive yourself to the hospital. Summary Weakness is a lack of strength. You may feel weak all over your body or just in one specific part of your body. Weakness can be caused by a variety of things. In some cases, the cause may be unknown. Rest as needed, and try to get enough sleep. Most adults need 7-8 hours of quality sleep each night. Eat a healthy, well-balanced diet. This information is not intended to replace advice given to you by your health care provider. Make sure you discuss any questions you have with your health care provider. Document Revised: 10/24/2021 Document Reviewed: 10/24/2021 Elsevier Patient Education  2023 [employer]  

## 2022-10-15 LAB — CBC WITH DIFFERENTIAL/PLATELET
Basophils Absolute: 0.1 10*3/uL (ref 0.0–0.2)
Basos: 1 %
EOS (ABSOLUTE): 0.1 10*3/uL (ref 0.0–0.4)
Eos: 1 %
Hematocrit: 31.7 % — ABNORMAL LOW (ref 37.5–51.0)
Hemoglobin: 10.2 g/dL — ABNORMAL LOW (ref 13.0–17.7)
Immature Grans (Abs): 0.1 10*3/uL (ref 0.0–0.1)
Immature Granulocytes: 1 %
Lymphocytes Absolute: 1.6 10*3/uL (ref 0.7–3.1)
Lymphs: 12 %
MCH: 29.7 pg (ref 26.6–33.0)
MCHC: 32.2 g/dL (ref 31.5–35.7)
MCV: 92 fL (ref 79–97)
Monocytes Absolute: 1.1 10*3/uL — ABNORMAL HIGH (ref 0.1–0.9)
Monocytes: 8 %
Neutrophils Absolute: 10.4 10*3/uL — ABNORMAL HIGH (ref 1.4–7.0)
Neutrophils: 77 %
Platelets: 421 10*3/uL (ref 150–450)
RBC: 3.43 x10E6/uL — ABNORMAL LOW (ref 4.14–5.80)
RDW: 17.8 % — ABNORMAL HIGH (ref 11.6–15.4)
WBC: 13.5 10*3/uL — ABNORMAL HIGH (ref 3.4–10.8)

## 2022-10-15 LAB — CMP14+EGFR
ALT: 14 IU/L (ref 0–44)
AST: 13 IU/L (ref 0–40)
Albumin/Globulin Ratio: 1.9 (ref 1.2–2.2)
Albumin: 4.4 g/dL (ref 3.7–4.7)
Alkaline Phosphatase: 63 IU/L (ref 44–121)
BUN/Creatinine Ratio: 15 (ref 10–24)
BUN: 15 mg/dL (ref 8–27)
Bilirubin Total: 0.6 mg/dL (ref 0.0–1.2)
CO2: 22 mmol/L (ref 20–29)
Calcium: 9 mg/dL (ref 8.6–10.2)
Chloride: 105 mmol/L (ref 96–106)
Creatinine, Ser: 1.01 mg/dL (ref 0.76–1.27)
Globulin, Total: 2.3 g/dL (ref 1.5–4.5)
Glucose: 90 mg/dL (ref 70–99)
Potassium: 4.2 mmol/L (ref 3.5–5.2)
Sodium: 141 mmol/L (ref 134–144)
Total Protein: 6.7 g/dL (ref 6.0–8.5)
eGFR: 74 mL/min/{1.73_m2} (ref >59)

## 2022-10-17 ENCOUNTER — Other Ambulatory Visit: Payer: Self-pay | Admitting: Family

## 2022-10-17 DIAGNOSIS — I10 Essential (primary) hypertension: Secondary | ICD-10-CM

## 2022-10-17 DIAGNOSIS — R531 Weakness: Secondary | ICD-10-CM

## 2022-10-17 DIAGNOSIS — R296 Repeated falls: Secondary | ICD-10-CM

## 2022-10-17 MED ORDER — DOXYCYCLINE HYCLATE 100 MG PO TABS: 100.0000 mg | ORAL_TABLET | Freq: Two times a day (BID) | ORAL | 0 refills | Status: DC

## 2022-10-19 ENCOUNTER — Other Ambulatory Visit: Payer: Medicare Other

## 2022-10-19 DIAGNOSIS — I951 Orthostatic hypotension: Secondary | ICD-10-CM

## 2022-10-19 DIAGNOSIS — R231 Pallor: Secondary | ICD-10-CM

## 2022-10-20 LAB — FECAL OCCULT BLOOD, IMMUNOCHEMICAL: Fecal Occult Bld: POSITIVE — AB

## 2022-10-21 ENCOUNTER — Other Ambulatory Visit: Payer: Self-pay | Admitting: Family

## 2022-10-21 DIAGNOSIS — D649 Anemia, unspecified: Secondary | ICD-10-CM

## 2022-10-21 DIAGNOSIS — R195 Other fecal abnormalities: Secondary | ICD-10-CM

## 2022-11-01 ENCOUNTER — Encounter: Payer: Self-pay | Admitting: Family

## 2022-11-01 ENCOUNTER — Ambulatory Visit (INDEPENDENT_AMBULATORY_CARE_PROVIDER_SITE_OTHER): Payer: Medicare Other | Admitting: Family

## 2022-11-01 VITALS — BP 120/70 | HR 96 | Temp 97.8°F | Ht 69.0 in | Wt 169.8 lb

## 2022-11-01 DIAGNOSIS — J4520 Mild intermittent asthma, uncomplicated: Secondary | ICD-10-CM | POA: Diagnosis not present

## 2022-11-01 DIAGNOSIS — R195 Other fecal abnormalities: Secondary | ICD-10-CM | POA: Diagnosis not present

## 2022-11-01 DIAGNOSIS — Z9181 History of falling: Secondary | ICD-10-CM | POA: Diagnosis not present

## 2022-11-01 DIAGNOSIS — R296 Repeated falls: Secondary | ICD-10-CM | POA: Diagnosis not present

## 2022-11-01 DIAGNOSIS — R531 Weakness: Secondary | ICD-10-CM | POA: Diagnosis not present

## 2022-11-01 DIAGNOSIS — J4540 Moderate persistent asthma, uncomplicated: Secondary | ICD-10-CM | POA: Diagnosis not present

## 2022-11-01 DIAGNOSIS — M1990 Unspecified osteoarthritis, unspecified site: Secondary | ICD-10-CM

## 2022-11-01 LAB — CMP14+EGFR
ALT: 12 IU/L (ref 0–44)
AST: 21 IU/L (ref 0–40)
Albumin/Globulin Ratio: 2 (ref 1.2–2.2)
Albumin: 4.5 g/dL (ref 3.7–4.7)
Alkaline Phosphatase: 61 IU/L (ref 44–121)
BUN/Creatinine Ratio: 15 (ref 10–24)
BUN: 18 mg/dL (ref 8–27)
Bilirubin Total: 0.5 mg/dL (ref 0.0–1.2)
CO2: 20 mmol/L (ref 20–29)
Calcium: 9.6 mg/dL (ref 8.6–10.2)
Chloride: 106 mmol/L (ref 96–106)
Creatinine, Ser: 1.18 mg/dL (ref 0.76–1.27)
Globulin, Total: 2.2 g/dL (ref 1.5–4.5)
Glucose: 95 mg/dL (ref 70–99)
Potassium: 4.6 mmol/L (ref 3.5–5.2)
Sodium: 142 mmol/L (ref 134–144)
Total Protein: 6.7 g/dL (ref 6.0–8.5)
eGFR: 62 mL/min/{1.73_m2} (ref >59)

## 2022-11-01 LAB — CBC WITH DIFFERENTIAL/PLATELET
Basophils Absolute: 0.1 10*3/uL (ref 0.0–0.2)
Basos: 1 %
EOS (ABSOLUTE): 0.1 10*3/uL (ref 0.0–0.4)
Eos: 1 %
Hematocrit: 32 % — ABNORMAL LOW (ref 37.5–51.0)
Hemoglobin: 10.2 g/dL — ABNORMAL LOW (ref 13.0–17.7)
Immature Grans (Abs): 0.1 10*3/uL (ref 0.0–0.1)
Immature Granulocytes: 1 %
Lymphocytes Absolute: 1.5 10*3/uL (ref 0.7–3.1)
Lymphs: 13 %
MCH: 29.8 pg (ref 26.6–33.0)
MCHC: 31.9 g/dL (ref 31.5–35.7)
MCV: 94 fL (ref 79–97)
Monocytes Absolute: 1 10*3/uL — ABNORMAL HIGH (ref 0.1–0.9)
Monocytes: 9 %
Neutrophils Absolute: 8.3 10*3/uL — ABNORMAL HIGH (ref 1.4–7.0)
Neutrophils: 75 %
Platelets: 398 10*3/uL (ref 150–450)
RBC: 3.42 x10E6/uL — ABNORMAL LOW (ref 4.14–5.80)
RDW: 17.9 % — ABNORMAL HIGH (ref 11.6–15.4)
WBC: 11.1 10*3/uL — ABNORMAL HIGH (ref 3.4–10.8)

## 2022-11-01 MED ORDER — ALBUTEROL SULFATE HFA 108 (90 BASE) MCG/ACT IN AERS: 2.0000 | INHALATION_SPRAY | Freq: Four times a day (QID) | RESPIRATORY_TRACT | 2 refills | Status: DC | PRN

## 2022-11-01 NOTE — Progress Notes (Signed)
Subjective:    Patient ID: Casey Klein, male    DOB: Aug 29, 1941, 81 y.o.   MRN: [id number]  Chief Complaint  Patient presents with   Follow-up   Pt presents to the office today for follow up on frequent falls, weakness, and hypotension.    We had placed orders for [facility], but they were refused. Pt's family states this was done by mistake and wants orders replaced.   We stopped his mobic 15 mg and lisinopril 10 mg.   Continues to feel fatigue and falling. States he has fallen 5 times in the last two days.    He had a negative chest x-ray and KUB. Had a positive FOBT and referral to GI pending.  Asthma He complains of cough, shortness of breath and wheezing. This is a chronic problem. The current episode started more than 1 year ago. The problem has been waxing and waning. His symptoms are alleviated by rest. His past medical history is significant for asthma.  Arthritis Presents for follow-up visit. He complains of pain and stiffness. The symptoms have been stable. Affected locations include the left knee, right knee, right MCP, left MCP, right shoulder and left shoulder (back). His pain is at a severity of 8/10.      Review of Systems  Respiratory:  Positive for cough, shortness of breath and wheezing.   Musculoskeletal:  Positive for arthritis and stiffness.  All other systems reviewed and are negative.      Objective:   Physical Exam Vitals reviewed.  Constitutional:      General: He is not in acute distress.    Appearance: He is well-developed.  HENT:     Head: Normocephalic.     Right Ear: Tympanic membrane normal.     Left Ear: Tympanic membrane normal.  Eyes:     General:        Right eye: No discharge.        Left eye: No discharge.     Pupils: Pupils are equal, round, and reactive to light.  Neck:     Thyroid: No thyromegaly.  Cardiovascular:     Rate and Rhythm: Normal rate and regular rhythm.     Heart sounds: Normal heart sounds. No murmur  heard. Pulmonary:     Effort: Pulmonary effort is normal. No respiratory distress.     Breath sounds: Normal breath sounds. No wheezing.  Abdominal:     General: Bowel sounds are normal. There is no distension.     Palpations: Abdomen is soft.     Tenderness: There is no abdominal tenderness.  Musculoskeletal:        General: No tenderness. Normal range of motion.     Cervical back: Normal range of motion and neck supple.  Skin:    General: Skin is warm and dry.     Findings: No erythema or rash.  Neurological:     Mental Status: He is alert and oriented to person, place, and time.     Cranial Nerves: No cranial nerve deficit.     Motor: Weakness present.     Gait: Gait abnormal (using rolling walker).     Deep Tendon Reflexes: Reflexes are normal and symmetric.  Psychiatric:        Behavior: Behavior normal.        Thought Content: Thought content normal.        Judgment: Judgment normal.       BP 120/70   Pulse 96   Temp 97.8  F (36.6 C) (Temporal)   Ht _0  (1.753 m)   Wt 169 lb 12.8 oz (77 kg)   SpO2 97%   BMI 25.08 kg/m      Assessment & Plan:  Casey Klein comes in today with chief complaint of Follow-up   Diagnosis and orders addressed:  1. Weakness - Ambulatory referral to [place] with Differential/Platelet  2. Risk for falls - Ambulatory referral to [place] with Differential/Platelet  3. Frequent falls - Ambulatory referral to [place] with Differential/Platelet  4. Arthritis - Ambulatory referral to [place] with Differential/Platelet  5. Moderate persistent asthma without complication - [id number] - CBC with Differential/Platelet  6. Fecal occult blood test positive  7. Mild intermittent asthma without complication - albuterol (VENTOLIN HFA) 108 (90 Base) MCG/ACT inhaler; Inhale 2 puffs into the lungs every 6 (six) hours as needed for  wheezing or shortness of breath.  Dispense: 8 g; Refill: 2   Labs pending HH reordered Fall precautions discussed  Hgb treading upwards, rechecking today Health Maintenance reviewed Diet and exercise encouraged  Follow up plan: 1 month    Evelina Dun, FNP

## 2022-11-01 NOTE — Patient Instructions (Signed)
Fall Prevention in the Home, Adult Falls can cause injuries and affect people of all ages. There are many simple things that you can do to make your home safe and to help prevent falls. Ask for help when making these changes, if needed. What actions can I take to prevent falls? General instructions Use good lighting in all rooms. Replace any light bulbs that burn out, turn on lights if it is dark, and use night-lights. Place frequently used items in easy-to-reach places. Lower the shelves around your home if necessary. Set up furniture so that there are clear paths around it. Avoid moving your furniture around. Remove throw rugs and other tripping hazards from the floor. Avoid walking on wet floors. Fix any uneven floor surfaces. Add color or contrast paint or tape to grab bars and handrails in your home. Place contrasting color strips on the first and last steps of staircases. When you use a stepladder, make sure that it is completely opened and that the sides and supports are firmly locked. Have someone hold the ladder while you are using it. Do not climb a closed stepladder. Know where your pets are when moving through your home. What can I do in the bathroom?     Keep the floor dry. Immediately clean up any water that is on the floor. Remove soap buildup in the tub or shower regularly. Use nonskid mats or decals on the floor of the tub or shower. Attach bath mats securely with double-sided, nonslip rug tape. If you need to sit down while you are in the shower, use a plastic, nonslip stool. Install grab bars by the toilet and in the tub and shower. Do not use towel bars as grab bars. What can I do in the bedroom? Make sure that a bedside light is easy to reach. Do not use oversized bedding that reaches the floor. Have a firm chair that has side arms to use for getting dressed. What can I do in the kitchen? Clean up any spills right away. If you need to reach for something above you,  use a sturdy step stool that has a grab bar. Keep electrical cables out of the way. Do not use floor polish or wax that makes floors slippery. If you must use wax, make sure that it is non-skid floor wax. What can I do with my stairs? Do not leave any items on the stairs. Make sure that you have a light switch at the top and the bottom of the stairs. Have them installed if you do not have them. Make sure that there are handrails on both sides of the stairs. Fix handrails that are broken or loose. Make sure that handrails are as long as the staircases. Install non-slip stair treads on all stairs in your home. Avoid having throw rugs at the top or bottom of stairs, or secure the rugs with carpet tape to prevent them from moving. Choose a carpet design that does not hide the edge of steps on the stairs. Check any carpeting to make sure that it is firmly attached to the stairs. Fix any carpet that is loose or worn. What can I do on the outside of my home? Use bright outdoor lighting. Regularly repair the edges of walkways and driveways and fix any cracks. Remove high doorway thresholds. Trim any shrubbery on the main path into your home. Regularly check that handrails are securely fastened and in good repair. Both sides of all steps should have handrails. Install guardrails along   the edges of any raised decks or porches. Clear walkways of debris and clutter, including tools and rocks. Have leaves, snow, and ice cleared regularly. Use sand or salt on walkways during winter months. In the garage, clean up any spills right away, including grease or oil spills. What other actions can I take? Wear closed-toe shoes that fit well and support your feet. Wear shoes that have rubber soles or low heels. Use mobility aids as needed, such as canes, walkers, scooters, and crutches. Review your medicines with your health care provider. Some medicines can cause dizziness or changes in blood pressure, which  increase your risk of falling. Talk with your health care provider about other ways that you can decrease your risk of falls. This may include working with a physical therapist or trainer to improve your strength, balance, and endurance. Where to find more information Centers for Disease Control and Prevention, [employer]: www.cdc.gov National Institute on Aging: www.nia.nih.gov Contact a health care provider if: You are afraid of falling at home. You feel weak, drowsy, or dizzy at home. You fall at home. Summary There are many simple things that you can do to make your home safe and to help prevent falls. Ways to make your home safe include removing tripping hazards and installing grab bars in the bathroom. Ask for help when making these changes in your home. This information is not intended to replace advice given to you by your health care provider. Make sure you discuss any questions you have with your health care provider. Document Revised: 08/23/2021 Document Reviewed: 06/24/2020 Elsevier Patient Education  2023 Elsevier Inc.  

## 2022-11-07 ENCOUNTER — Encounter: Payer: Self-pay | Admitting: Diagnostic Neuroimaging

## 2022-11-07 ENCOUNTER — Ambulatory Visit (INDEPENDENT_AMBULATORY_CARE_PROVIDER_SITE_OTHER): Payer: Medicare Other | Admitting: Diagnostic Neuroimaging

## 2022-11-07 VITALS — BP 98/66 | HR 70 | Ht 69.0 in | Wt 169.0 lb

## 2022-11-07 DIAGNOSIS — G20A1 Parkinson's disease without dyskinesia, without mention of fluctuations: Secondary | ICD-10-CM | POA: Diagnosis not present

## 2022-11-07 NOTE — Patient Instructions (Addendum)
-   follow up home health PT, OT, ST  - needs 24 hour supervision  - consider carbidopa/levodopa  - follow up GI consult (for anemia, GI bleeding evaluation)

## 2022-11-07 NOTE — Progress Notes (Signed)
[facility]  PATIENT: Casey Klein DOB: Apr 28, 1941  REFERRING CLINICIAN: Junie SpencerHawks, [name] A, FNP HISTORY FROM: patient and daughter and grandson REASON FOR VISIT: new consult   HISTORICAL  CHIEF COMPLAINT:  Chief Complaint  Patient presents with   New Patient (Initial Visit)    Rm 7, with daughter and grandson  NP Internal referral for loss of consciousness, tremor bilateral hands and frequent falls/sched w pt over the phone. nb    HISTORY OF PRESENT ILLNESS:   81 year old male here for evaluation of tremors and gait difficulty.  Patient started having gait and balance difficulty several years ago with frequent falls increasing over time.  Symptoms have significantly worsened the past 6 months.  Having some worsening tremors, slow movements, unsteady balance and walking.  He punches and kicks in his sleep.  His speech has become softer and more slurred.  He is now living with his grandson.    REVIEW OF SYSTEMS: Full 14 system review of systems performed and negative with exception of: as per HPI.  ALLERGIES: Allergies  Allergen Reactions   Pneumococcal Vaccines     Pt states [facility] and arm swelled "huge with a lot swelling"    Penicillins Other (See Comments)    unknown    HOME MEDICATIONS: Outpatient Medications Prior to Visit  Medication Sig Dispense Refill   acetaminophen (TYLENOL) 500 MG tablet Take 500 mg by mouth every 6 (six) hours as needed.     albuterol (VENTOLIN HFA) 108 (90 Base) MCG/ACT inhaler Inhale 2 puffs into the lungs every 6 (six) hours as needed for wheezing or shortness of breath. 8 g 2   amLODipine (NORVASC) 10 MG tablet TAKE 1 TABLET BY MOUTH EVERY DAY 90 tablet 0   aspirin EC 81 MG tablet Take 1 tablet (81 mg total) by mouth daily. 90 tablet 1   atorvastatin (LIPITOR) 10 MG tablet Take 1 tablet (10 mg total) by mouth daily. 90 tablet 2   budesonide-formoterol (SYMBICORT) 160-4.5  MCG/ACT inhaler Inhale 2 puffs into the lungs 2 (two) times daily. 1 each 3   buPROPion (WELLBUTRIN XL) 300 MG 24 hr tablet Take 1 tablet (300 mg total) by mouth daily. 90 tablet 1   diclofenac Sodium (VOLTAREN) 1 % GEL Apply 2 g topically 4 (four) times daily. 50 g 1   ferrous sulfate 325 (65 FE) MG tablet Take 325 mg by mouth daily with breakfast.     fluticasone (FLONASE) 50 MCG/ACT nasal spray Place 2 sprays into both nostrils daily. 16 g 6   Vitamin D, Ergocalciferol, (DRISDOL) 1.25 MG (50000 UNIT) CAPS capsule Take 1 capsule (50,000 Units total) by mouth every 7 (seven) days. 12 capsule 3   cetirizine (ZYRTEC) 10 MG tablet Take 1 tablet (10 mg total) by mouth daily. 30 tablet 11   citalopram (CELEXA) 40 MG tablet TAKE 1 TABLET BY MOUTH EVERY DAY 90 tablet 0   No facility-administered medications prior to visit.    PAST MEDICAL HISTORY: Past Medical History:  Diagnosis Date   Allergy    Asthma    CAD (coronary artery disease)    Nonobstructive   Carotid stenosis    50% bilateral 2012   Cataract    History of kidney stones    HTN (hypertension)    PAT (paroxysmal atrial tachycardia)     PAST SURGICAL HISTORY: Past Surgical History:  Procedure Laterality Date   BACK SURGERY     lumbar disc  CATARACT EXTRACTION W/PHACO Right 05/06/2013   Procedure: CATARACT EXTRACTION PHACO AND INTRAOCULAR LENS PLACEMENT (IOC);  Surgeon: Susa Simmonds, MD;  Location: [facility];  Service: Ophthalmology;  Laterality: Right;  CDE: 19.84   CATARACT EXTRACTION W/PHACO Left 06/24/2013   Procedure: CATARACT EXTRACTION PHACO AND INTRAOCULAR LENS PLACEMENT (IOC);  Surgeon: Susa Simmonds, MD;  Location: [facility];  Service: Ophthalmology;  Laterality: Left;  CDE:  18.21   CERVICAL DISC SURGERY     EYE SURGERY      FAMILY HISTORY: History reviewed. No pertinent family history.  SOCIAL HISTORY: Social History   Socioeconomic History   Marital status: Significant Other    Spouse name: Not on file    Number of children: 3   Years of education: Not on file   Highest education level: Not on file  Occupational History   Occupation: retired  Tobacco Use   Smoking status: Former    Packs/day: 1.00    Years: 10.00    Total pack years: 10.00    Types: Cigarettes    Start date: 02/07/1983   Smokeless tobacco: Never  Vaping Use   Vaping Use: Never used  Substance and Sexual Activity   Alcohol use: No   Drug use: No   Sexual activity: Never    Birth control/protection: None  Other Topics Concern   Not on file  Social History Narrative   Lives alone   Karma Greaser friend spends time with him daily   Children live close by   Social Determinants of Health   Financial Resource Strain: Low Risk  (03/24/2022)   Overall Financial Resource Strain (CARDIA)    Difficulty of Paying Living Expenses: Not hard at all  Food Insecurity: No Food Insecurity (03/24/2022)   Hunger Vital Sign    Worried About Running Out of Food in the Last Year: Never true    Ran Out of Food in the Last Year: Never true  Transportation Needs: No Transportation Needs (03/24/2022)   PRAPARE - Administrator, Civil Service (Medical): No    Lack of Transportation (Non-Medical): No  Physical Activity: Insufficiently Active (03/24/2022)   Exercise Vital Sign    Days of Exercise per Week: 7 days    Minutes of Exercise per Session: 10 min  Stress: No Stress Concern Present (03/24/2022)   [employer] of Occupational Health - Occupational Stress Questionnaire    Feeling of Stress : Not at all  Social Connections: Moderately Integrated (03/24/2022)   Social Connection and Isolation Panel [NHANES]    Frequency of Communication with Friends and Family: Twice a week    Frequency of Social Gatherings with Friends and Family: Twice a week    Attends Religious Services: More than 4 times per year    Active Member of [employer] or Organizations: Yes    Attends Banker Meetings: More than 4 times per year     Marital Status: Widowed  Intimate Partner Violence: Not At Risk (03/24/2022)   Humiliation, Afraid, Rape, and Kick questionnaire    Fear of Current or Ex-Partner: No    Emotionally Abused: No    Physically Abused: No    Sexually Abused: No     PHYSICAL EXAM  GENERAL EXAM/CONSTITUTIONAL: Vitals:  Vitals:   11/07/22 1128  BP: 98/66  Pulse: 70  Weight: 169 lb (76.7 kg)  Height: 5\' 9"  (1.753 m)   Body mass index is 24.96 kg/m. Wt Readings from Last 3 Encounters:  11/07/22 169 lb (76.7 kg)  11/01/22 169 lb 12.8 oz (77 kg)  07/29/22 195 lb (88.5 kg)   Patient is in no distress; well developed, nourished and groomed; neck is supple  CARDIOVASCULAR: Examination of carotid arteries is normal; no carotid bruits Regular rate and rhythm, no murmurs Examination of peripheral vascular system by observation and palpation is normal  EYES: Ophthalmoscopic exam of optic discs and posterior segments is normal; no papilledema or hemorrhages No results found.  MUSCULOSKELETAL: Gait, strength, tone, movements noted in Neurologic exam below  NEUROLOGIC: MENTAL STATUS:     01/01/2019   10:04 AM 01/11/2017    9:39 AM 08/14/2015    1:07 PM  MMSE - Mini Mental State Exam  Orientation to time 4 5 5   Orientation to Place 5 5 5   Registration 3 3 3   Attention/ Calculation 0 5 0  Recall 0 3 3  Language- name 2 objects 2 2 2   Language- repeat 1 1 1   Language- follow 3 step command 2 3 3   Language- read & follow direction 0 1 1  Write a sentence 0 1 1  Copy design 1 1 1   Total score 18 30 25    awake, alert, oriented to person DECR FLUENCY AND COMPREHENSION  CRANIAL NERVE:  2nd - no papilledema on fundoscopic exam 2nd, 3rd, 4th, 6th - pupils equal and reactive to light, visual fields full to confrontation, extraocular muscles intact, no nystagmus 5th - facial sensation symmetric 7th - facial strength symmetric 8th - hearing intact 9th - palate elevates symmetrically, uvula  midline 11th - shoulder shrug symmetric 12th - tongue protrusion midline SLOW SPEECH; HOARSE VOICE  MOTOR:  RESTING TREMOR IN BUE; HEAD TREMOR COGWHEELING RIGIDITY (RUE > LUE) BRADYKINESIA IN BUE AND BLE DIFFUSE 4/5 strength in the BUE, BLE  SENSORY:  normal and symmetric to light touch, temperature, vibration  COORDINATION:  finger-nose-finger, fine finger movements SLOW  REFLEXES:  deep tendon reflexes TRACE and symmetric  GAIT/STATION:  VERY SLOW TO STAND; NEEDS 2 PERSON ASSIST; WALKER; FREEZING GAIT; STOOPED POSTURE     DIAGNOSTIC DATA (LABS, IMAGING, TESTING) - I reviewed patient records, labs, notes, testing and imaging myself where available.  Lab Results  Component Value Date   WBC 11.1 (H) 11/01/2022   HGB 10.2 (L) 11/01/2022   HCT 32.0 (L) 11/01/2022   MCV 94 11/01/2022   PLT 398 11/01/2022      Component Value Date/Time   NA 142 11/01/2022 1006   K 4.6 11/01/2022 1006   CL 106 11/01/2022 1006   CO2 20 11/01/2022 1006   GLUCOSE 95 11/01/2022 1006   GLUCOSE 138 (H) 08/09/2022 2102   BUN 18 11/01/2022 1006   CREATININE 1.18 11/01/2022 1006   CALCIUM 9.6 11/01/2022 1006   PROT 6.7 11/01/2022 1006   ALBUMIN 4.5 11/01/2022 1006   AST 21 11/01/2022 1006   ALT 12 11/01/2022 1006   ALKPHOS 61 11/01/2022 1006   BILITOT 0.5 11/01/2022 1006   GFRNONAA 52 (L) 08/09/2022 2102   GFRAA 77 01/14/2021 1018   Lab Results  Component Value Date   CHOL 133 06/28/2022   HDL 40 06/28/2022   LDLCALC 57 06/28/2022   TRIG 223 (H) 06/28/2022   CHOLHDL 3.3 06/28/2022   Lab Results  Component Value Date   HGBA1C  01/15/2009    5.9 (NOTE)   The ADA recommends the following therapeutic goal for glycemic   control related to Hgb A1C measurement:   Goal of Therapy:   < 7.0% Hgb A1C  Reference: American Diabetes Association: Clinical Practice   Recommendations 2008, Diabetes Care,  2008, 31:(Suppl 1).   Lab Results  Component Value Date   VITAMINB12 266 10/07/2022    Lab Results  Component Value Date   TSH 1.770 09/08/2022    08/09/22 CT head  1. No CT evidence for acute intracranial abnormality. 2. Mild atrophy and chronic small vessel ischemic changes of the white matter.    ASSESSMENT AND PLAN  81 y.o. year old male here with progressive gait and balance difficulty, resting tremor, cogwheel rigidity, bradykinesia, masked facies, speech difficulty, REM behavior sleep disorder.   Dx:  1. Parkinson's disease without dyskinesia or fluctuating manifestations      PLAN:  PARKINSON'S DISEASE  - follow up home health PT, OT, ST (ordered by PCP)  - needs 24 hour supervision; fall precautions reviewed  - consider carbidopa/levodopa  - follow up GI consult (for anemia, GI bleeding evaluation)  Return in about 3 months (around 02/06/2023).  I spent 60 minutes of face-to-face and non-face-to-face time with patient.  This included previsit chart review, lab review, study review, order entry, electronic health record documentation, patient education.     Suanne Marker, MD 11/07/2022, 11:34 AM Certified in Neurology, Neurophysiology and Neuroimaging  [facility] [address], [address] [place], [place] [address] [phone]

## 2022-11-24 DIAGNOSIS — F32A Depression, unspecified: Secondary | ICD-10-CM | POA: Diagnosis not present

## 2022-11-24 DIAGNOSIS — M19012 Primary osteoarthritis, left shoulder: Secondary | ICD-10-CM | POA: Diagnosis not present

## 2022-11-24 DIAGNOSIS — J4540 Moderate persistent asthma, uncomplicated: Secondary | ICD-10-CM | POA: Diagnosis not present

## 2022-11-24 DIAGNOSIS — Z7951 Long term (current) use of inhaled steroids: Secondary | ICD-10-CM | POA: Diagnosis not present

## 2022-11-24 DIAGNOSIS — M19041 Primary osteoarthritis, right hand: Secondary | ICD-10-CM | POA: Diagnosis not present

## 2022-11-24 DIAGNOSIS — I6523 Occlusion and stenosis of bilateral carotid arteries: Secondary | ICD-10-CM | POA: Diagnosis not present

## 2022-11-24 DIAGNOSIS — E559 Vitamin D deficiency, unspecified: Secondary | ICD-10-CM | POA: Diagnosis not present

## 2022-11-24 DIAGNOSIS — I25110 Atherosclerotic heart disease of native coronary artery with unstable angina pectoris: Secondary | ICD-10-CM | POA: Diagnosis not present

## 2022-11-24 DIAGNOSIS — S2231XD Fracture of one rib, right side, subsequent encounter for fracture with routine healing: Secondary | ICD-10-CM | POA: Diagnosis not present

## 2022-11-24 DIAGNOSIS — I10 Essential (primary) hypertension: Secondary | ICD-10-CM | POA: Diagnosis not present

## 2022-11-24 DIAGNOSIS — M170 Bilateral primary osteoarthritis of knee: Secondary | ICD-10-CM | POA: Diagnosis not present

## 2022-11-24 DIAGNOSIS — Z792 Long term (current) use of antibiotics: Secondary | ICD-10-CM | POA: Diagnosis not present

## 2022-11-24 DIAGNOSIS — E538 Deficiency of other specified B group vitamins: Secondary | ICD-10-CM | POA: Diagnosis not present

## 2022-11-24 DIAGNOSIS — M19042 Primary osteoarthritis, left hand: Secondary | ICD-10-CM | POA: Diagnosis not present

## 2022-11-24 DIAGNOSIS — Z7982 Long term (current) use of aspirin: Secondary | ICD-10-CM | POA: Diagnosis not present

## 2022-11-24 DIAGNOSIS — Z9181 History of falling: Secondary | ICD-10-CM | POA: Diagnosis not present

## 2022-11-24 DIAGNOSIS — I959 Hypotension, unspecified: Secondary | ICD-10-CM | POA: Diagnosis not present

## 2022-11-24 DIAGNOSIS — E785 Hyperlipidemia, unspecified: Secondary | ICD-10-CM | POA: Diagnosis not present

## 2022-11-24 DIAGNOSIS — M19011 Primary osteoarthritis, right shoulder: Secondary | ICD-10-CM | POA: Diagnosis not present

## 2022-11-24 DIAGNOSIS — Z79899 Other long term (current) drug therapy: Secondary | ICD-10-CM | POA: Diagnosis not present

## 2022-11-25 ENCOUNTER — Ambulatory Visit (INDEPENDENT_AMBULATORY_CARE_PROVIDER_SITE_OTHER): Payer: Medicare Other | Admitting: Family

## 2022-11-25 ENCOUNTER — Encounter: Payer: Self-pay | Admitting: Family

## 2022-11-25 VITALS — BP 122/68 | HR 90 | Temp 97.1°F | Ht 69.0 in

## 2022-11-25 DIAGNOSIS — Z23 Encounter for immunization: Secondary | ICD-10-CM

## 2022-11-25 DIAGNOSIS — Z9181 History of falling: Secondary | ICD-10-CM | POA: Diagnosis not present

## 2022-11-25 DIAGNOSIS — F411 Generalized anxiety disorder: Secondary | ICD-10-CM

## 2022-11-25 DIAGNOSIS — J4540 Moderate persistent asthma, uncomplicated: Secondary | ICD-10-CM

## 2022-11-25 DIAGNOSIS — M1990 Unspecified osteoarthritis, unspecified site: Secondary | ICD-10-CM | POA: Diagnosis not present

## 2022-11-25 DIAGNOSIS — G20A1 Parkinson's disease without dyskinesia, without mention of fluctuations: Secondary | ICD-10-CM | POA: Diagnosis not present

## 2022-11-25 DIAGNOSIS — I10 Essential (primary) hypertension: Secondary | ICD-10-CM

## 2022-11-25 DIAGNOSIS — E785 Hyperlipidemia, unspecified: Secondary | ICD-10-CM

## 2022-11-25 NOTE — Addendum Note (Signed)
Addended by: Jannifer Rodney A on: 11/25/2022 02:22 PM   Modules accepted: Level of Service

## 2022-11-25 NOTE — Progress Notes (Signed)
Subjective:    Patient ID: Casey Klein, male    DOB: 03-Dec-1941, 81 y.o.   MRN: [id number]  Chief Complaint  Patient presents with   Follow-up   Pt presents to the office today for follow up on frequent falls, weakness, and hypotension.  He was recently diagnosed with Parkinson's Disease.   Discussion to start levodopa/carbidopa.    PT and OT was started yesterday. Continues to have generalized weakness and has fallen >10 times this year.    We stopped his mobic 15 mg and lisinopril 10 mg.  He had a negative chest x-ray and KUB. Had a positive FOBT and referral to GI pending. Asthma There is no cough, shortness of breath or sputum production. This is a chronic problem. The current episode started more than 1 year ago. The problem occurs intermittently. Associated symptoms include malaise/fatigue. His past medical history is significant for asthma.  Hypertension This is a chronic problem. The current episode started more than 1 year ago. The problem has been resolved since onset. The problem is controlled. Associated symptoms include anxiety and malaise/fatigue. Pertinent negatives include no peripheral edema or shortness of breath. Risk factors for coronary artery disease include dyslipidemia and male gender. The current treatment provides moderate improvement.  Arthritis Presents for follow-up visit. He complains of pain and stiffness. The symptoms have been stable. Affected locations include the right knee, left knee, left hip and right hip. His pain is at a severity of 6/10.  Hyperlipidemia This is a chronic problem. The current episode started more than 1 year ago. The problem is controlled. Recent lipid tests were reviewed and are normal. Pertinent negatives include no shortness of breath. Current antihyperlipidemic treatment includes statins. The current treatment provides moderate improvement of lipids. Risk factors for coronary artery disease include hypertension,  dyslipidemia, male sex and a sedentary lifestyle.  Anxiety Presents for follow-up visit. Symptoms include depressed mood, excessive worry, nervous/anxious behavior and restlessness. Patient reports no shortness of breath or suicidal ideas. Symptoms occur occasionally.   His past medical history is significant for asthma.  Depression        This is a chronic problem.  The current episode started more than 1 year ago.   The onset quality is gradual.   The problem occurs intermittently.  Associated symptoms include restlessness.  Associated symptoms include no helplessness, no hopelessness, not sad and no suicidal ideas.  Treatments tried: wellbutrin.  Past medical history includes anxiety.       Review of Systems  Constitutional:  Positive for malaise/fatigue.  Respiratory:  Negative for cough, sputum production and shortness of breath.   Musculoskeletal:  Positive for arthritis and stiffness.  Psychiatric/Behavioral:  Positive for depression. Negative for suicidal ideas. The patient is nervous/anxious.   All other systems reviewed and are negative.      Objective:   Physical Exam Vitals reviewed.  Constitutional:      General: He is not in acute distress.    Appearance: He is well-developed.  HENT:     Head: Normocephalic.     Right Ear: Tympanic membrane normal.     Left Ear: Tympanic membrane normal.  Eyes:     General:        Right eye: No discharge.        Left eye: No discharge.     Pupils: Pupils are equal, round, and reactive to light.  Neck:     Thyroid: No thyromegaly.  Cardiovascular:     Rate and Rhythm:  Normal rate and regular rhythm.     Heart sounds: Normal heart sounds. No murmur heard. Pulmonary:     Effort: Pulmonary effort is normal. No respiratory distress.     Breath sounds: Normal breath sounds. No wheezing.  Abdominal:     General: Bowel sounds are normal. There is no distension.     Palpations: Abdomen is soft.     Tenderness: There is no abdominal  tenderness.  Musculoskeletal:        General: No tenderness. Normal range of motion.     Cervical back: Normal range of motion and neck supple.  Skin:    General: Skin is warm and dry.     Findings: No erythema or rash.  Neurological:     Mental Status: He is alert and oriented to person, place, and time.     Cranial Nerves: No cranial nerve deficit.     Motor: Weakness (in wheelchair, two person assist) present.     Gait: Gait abnormal.     Deep Tendon Reflexes: Reflexes are normal and symmetric.  Psychiatric:        Behavior: Behavior normal.        Thought Content: Thought content normal.        Judgment: Judgment normal.       BP 122/68   Pulse 90   Temp (!) 97.1 F (36.2 C) (Temporal)   Ht _0  (1.753 m)   SpO2 97%   BMI 24.96 kg/m      Assessment & Plan:  JABRIEL VANDUYNE comes in today with chief complaint of Follow-up   Diagnosis and orders addressed:  1. Parkinson's disease without dyskinesia or fluctuating manifestations - CMP14+EGFR - CBC with Differential/Platelet  2. Moderate persistent asthma without complication - NOB09+GGEZ - CBC with Differential/Platelet  3. Arthritis - CMP14+EGFR - CBC with Differential/Platelet  4. ESSENTIAL HYPERTENSION, BENIGN - CMP14+EGFR - CBC with Differential/Platelet  5. GAD (generalized anxiety disorder) - CMP14+EGFR - CBC with Differential/Platelet  6. Hyperlipidemia, unspecified hyperlipidemia type - CMP14+EGFR - CBC with Differential/Platelet  7. Risk for falls - CMP14+EGFR - CBC with Differential/Platelet   Labs pending Patient wants to know about sitter  Health Maintenance reviewed Diet and exercise encouraged  Follow up plan: 3 months    Evelina Dun, FNP

## 2022-11-25 NOTE — Patient Instructions (Signed)
Parkinson's Disease Parkinson's disease is a movement disorder. It is a long-term condition that gets worse over time. Each person with Parkinson's disease is affected differently. This condition limits a person's ability to control movements and move the body normally. The condition can range from mild to severe. Parkinson's disease tends to get worse slowly over several years. What are the causes? Parkinson's disease is caused by a loss of brain cells (neurons) that make a brain chemical called dopamine. Dopamine is needed to control movement. As the condition gets worse, more neurons that make dopamine die. This makes it hard to move or control your movements. The exact cause of the loss of neurons is not known. Genes and the environment may contribute to the cause of Parkinson's disease. What increases the risk? The following factors may make you more likely to develop this condition: Being male. Being age 60 or older. Having a family history of Parkinson's disease. Having had a traumatic brain injury. Having been exposed to toxins, such as pesticides. Having depression. What are the signs or symptoms? Symptoms of this condition can vary. The main symptoms are related to movement. These include: A tremor or shaking while you are resting. You cannot control the shaking. Stiffness in your arms and legs (rigidity). Slowing of movement. You may lose facial expressions and have trouble making small movements that are needed to button clothing or brush your teeth. An abnormal walk. You may walk with short, shuffling steps. Loss of balance and stability when standing. You may sway, fall backward, and have trouble making turns. Other symptoms include: Mental or cognitive changes, including: Depression or anxiety. Having false beliefs (delusions). Seeing, hearing, or feeling things that do not exist (hallucinations). Trouble speaking or swallowing. Changes in bowel or bladder functions,  including constipation, having to go urgently or frequently, or not being able to control your bowel or bladder. Changes in sleep habits, acting out dreams, or trouble sleeping. Depending on the severity of the symptoms, Parkinson's disease may be mild, moderate, or advanced. Parkinson's disease progression is different for everyone. Some people may not progress to the advanced stage. Mild Parkinson's disease involves: Movement problems that do not affect daily activities. Movement problems on one side of the body. Moderate Parkinson's disease involves: Movement problems on both sides of the body. Slowing of movement. Coordination and balance problems. Advanced Parkinson's disease involves: Extreme difficulty walking. Inability to live alone safely. Signs of dementia, such as having trouble remembering things, doing daily tasks such as getting dressed, and problem solving. How is this diagnosed? This condition is diagnosed by a specialist. A diagnosis may be made based on symptoms, your medical history, and a physical exam. You may also have brain imaging tests to check for loss of neurons in the brain. How is this treated? There is no cure for Parkinson's disease. Treatment focuses on managing your symptoms. Treatment may include: Medicines. Everyone responds to medicines differently. Your response may change over time. Work with your health care provider to find the best medicines for you. Speech, occupational, and physical therapy. Deep brain stimulation surgery to reduce tremors and other involuntary movements. Follow these instructions at home: Medicines Take over-the-counter and prescription medicines only as told by your health care provider. Avoid taking medicines that can affect thinking, such as pain or sleeping medicines. Eating and drinking Follow instructions from your health care provider about eating or drinking restrictions. Do not drink alcohol. Activity Ask your health  care provider if it is safe for you   to drive. Do exercises as told by your health care provider or physical therapist. Lifestyle  Install grab bars and railings in your home to prevent falls. Do not use any products that contain nicotine or tobacco. These products include cigarettes, chewing tobacco, and vaping devices, such as e-cigarettes. If you need help quitting, ask your health care provider. Consider joining a support group for people with Parkinson's disease. General instructions Work with your health care provider to know the kind of day-to-day help that you may need and what to do to stay safe. Keep all follow-up visits. This is important. Follow-up visits include any visits with a physical therapist, speech therapist, or occupational therapist. Where to find more information National Institute of Neurological Disorders and Stroke: www.ninds.nih.gov Parkinson's Foundation: www.parkinson.org Contact a health care provider if: Medicines do not help your symptoms. You are unsteady or have fallen at home. You need more support to function well at home. You have trouble swallowing. You have severe constipation. You are having problems with side effects from your medicines. You feel confused, anxious, depressed, or have hallucinations. Get help right away if you: Are injured after a fall. Cannot swallow without choking. Have chest pain or trouble breathing. Do not feel safe at home. Have thoughts about hurting yourself or others. These symptoms may represent a serious problem that is an emergency. Do not wait to see if the symptoms will go away. Get medical help right away. Call your local emergency services (911 in the [place] Do not drive yourself to the hospital. If you ever feel like you may hurt yourself or others, or have thoughts about taking your own life, get help right away. Go to your nearest emergency department or: Call your local emergency services (911 in the  [place] Call a suicide crisis helpline, such as the National Suicide Prevention Lifeline at 1-800-273-8255 or 988 in the [place] This is open 24 hours a day in the [place] Text the Crisis Text Line at 741741 (in the [place] Summary Parkinson's disease is a long-term condition that gets worse over time. This condition limits your ability to control your movements and move your body normally. There is no cure for Parkinson's disease. Treatment focuses on managing your symptoms. Work with your health care provider to know the kind of day-to-day help that you may need and what to do to stay safe. Keep all follow-up visits, including any visits with a physical therapist, speech therapist, or occupational therapist. This is important. This information is not intended to replace advice given to you by your health care provider. Make sure you discuss any questions you have with your health care provider. Document Revised: 06/16/2021 Document Reviewed: 03/08/2021 Elsevier Patient Education  2023 [employer]  

## 2022-11-26 LAB — CMP14+EGFR
ALT: 12 IU/L (ref 0–44)
AST: 15 IU/L (ref 0–40)
Albumin/Globulin Ratio: 1.9 (ref 1.2–2.2)
Albumin: 4.2 g/dL (ref 3.7–4.7)
Alkaline Phosphatase: 62 IU/L (ref 44–121)
BUN/Creatinine Ratio: 15 (ref 10–24)
BUN: 17 mg/dL (ref 8–27)
Bilirubin Total: 0.3 mg/dL (ref 0.0–1.2)
CO2: 21 mmol/L (ref 20–29)
Calcium: 8.9 mg/dL (ref 8.6–10.2)
Chloride: 106 mmol/L (ref 96–106)
Creatinine, Ser: 1.15 mg/dL (ref 0.76–1.27)
Globulin, Total: 2.2 g/dL (ref 1.5–4.5)
Glucose: 125 mg/dL — ABNORMAL HIGH (ref 70–99)
Potassium: 4.2 mmol/L (ref 3.5–5.2)
Sodium: 142 mmol/L (ref 134–144)
Total Protein: 6.4 g/dL (ref 6.0–8.5)
eGFR: 63 mL/min/{1.73_m2} (ref >59)

## 2022-11-26 LAB — CBC WITH DIFFERENTIAL/PLATELET
Basophils Absolute: 0.1 10*3/uL (ref 0.0–0.2)
Basos: 1 %
EOS (ABSOLUTE): 0.1 10*3/uL (ref 0.0–0.4)
Eos: 1 %
Hematocrit: 34.3 % — ABNORMAL LOW (ref 37.5–51.0)
Hemoglobin: 10.9 g/dL — ABNORMAL LOW (ref 13.0–17.7)
Immature Grans (Abs): 0 10*3/uL (ref 0.0–0.1)
Immature Granulocytes: 0 %
Lymphocytes Absolute: 1.2 10*3/uL (ref 0.7–3.1)
Lymphs: 13 %
MCH: 29.4 pg (ref 26.6–33.0)
MCHC: 31.8 g/dL (ref 31.5–35.7)
MCV: 93 fL (ref 79–97)
Monocytes Absolute: 0.9 10*3/uL (ref 0.1–0.9)
Monocytes: 9 %
Neutrophils Absolute: 7.6 10*3/uL — ABNORMAL HIGH (ref 1.4–7.0)
Neutrophils: 76 %
Platelets: 348 10*3/uL (ref 150–450)
RBC: 3.71 x10E6/uL — ABNORMAL LOW (ref 4.14–5.80)
RDW: 18.1 % — ABNORMAL HIGH (ref 11.6–15.4)
WBC: 9.8 10*3/uL (ref 3.4–10.8)

## 2022-11-29 DIAGNOSIS — M19042 Primary osteoarthritis, left hand: Secondary | ICD-10-CM | POA: Diagnosis not present

## 2022-11-29 DIAGNOSIS — F32A Depression, unspecified: Secondary | ICD-10-CM | POA: Diagnosis not present

## 2022-11-29 DIAGNOSIS — S2231XD Fracture of one rib, right side, subsequent encounter for fracture with routine healing: Secondary | ICD-10-CM | POA: Diagnosis not present

## 2022-11-29 DIAGNOSIS — E559 Vitamin D deficiency, unspecified: Secondary | ICD-10-CM | POA: Diagnosis not present

## 2022-11-29 DIAGNOSIS — I6523 Occlusion and stenosis of bilateral carotid arteries: Secondary | ICD-10-CM | POA: Diagnosis not present

## 2022-11-29 DIAGNOSIS — E538 Deficiency of other specified B group vitamins: Secondary | ICD-10-CM | POA: Diagnosis not present

## 2022-11-29 DIAGNOSIS — Z9181 History of falling: Secondary | ICD-10-CM | POA: Diagnosis not present

## 2022-11-29 DIAGNOSIS — Z792 Long term (current) use of antibiotics: Secondary | ICD-10-CM | POA: Diagnosis not present

## 2022-11-29 DIAGNOSIS — I10 Essential (primary) hypertension: Secondary | ICD-10-CM | POA: Diagnosis not present

## 2022-11-29 DIAGNOSIS — E785 Hyperlipidemia, unspecified: Secondary | ICD-10-CM | POA: Diagnosis not present

## 2022-11-29 DIAGNOSIS — Z79899 Other long term (current) drug therapy: Secondary | ICD-10-CM | POA: Diagnosis not present

## 2022-11-29 DIAGNOSIS — M19011 Primary osteoarthritis, right shoulder: Secondary | ICD-10-CM | POA: Diagnosis not present

## 2022-11-29 DIAGNOSIS — Z7982 Long term (current) use of aspirin: Secondary | ICD-10-CM | POA: Diagnosis not present

## 2022-11-29 DIAGNOSIS — M19012 Primary osteoarthritis, left shoulder: Secondary | ICD-10-CM | POA: Diagnosis not present

## 2022-11-29 DIAGNOSIS — I959 Hypotension, unspecified: Secondary | ICD-10-CM | POA: Diagnosis not present

## 2022-11-29 DIAGNOSIS — M170 Bilateral primary osteoarthritis of knee: Secondary | ICD-10-CM | POA: Diagnosis not present

## 2022-11-29 DIAGNOSIS — J4540 Moderate persistent asthma, uncomplicated: Secondary | ICD-10-CM | POA: Diagnosis not present

## 2022-11-29 DIAGNOSIS — Z7951 Long term (current) use of inhaled steroids: Secondary | ICD-10-CM | POA: Diagnosis not present

## 2022-11-29 DIAGNOSIS — M19041 Primary osteoarthritis, right hand: Secondary | ICD-10-CM | POA: Diagnosis not present

## 2022-11-29 DIAGNOSIS — I25110 Atherosclerotic heart disease of native coronary artery with unstable angina pectoris: Secondary | ICD-10-CM | POA: Diagnosis not present

## 2022-12-02 DIAGNOSIS — E538 Deficiency of other specified B group vitamins: Secondary | ICD-10-CM | POA: Diagnosis not present

## 2022-12-02 DIAGNOSIS — Z9181 History of falling: Secondary | ICD-10-CM | POA: Diagnosis not present

## 2022-12-02 DIAGNOSIS — J4540 Moderate persistent asthma, uncomplicated: Secondary | ICD-10-CM | POA: Diagnosis not present

## 2022-12-02 DIAGNOSIS — Z7951 Long term (current) use of inhaled steroids: Secondary | ICD-10-CM | POA: Diagnosis not present

## 2022-12-02 DIAGNOSIS — E785 Hyperlipidemia, unspecified: Secondary | ICD-10-CM | POA: Diagnosis not present

## 2022-12-02 DIAGNOSIS — I959 Hypotension, unspecified: Secondary | ICD-10-CM | POA: Diagnosis not present

## 2022-12-02 DIAGNOSIS — M19011 Primary osteoarthritis, right shoulder: Secondary | ICD-10-CM | POA: Diagnosis not present

## 2022-12-02 DIAGNOSIS — M19042 Primary osteoarthritis, left hand: Secondary | ICD-10-CM | POA: Diagnosis not present

## 2022-12-02 DIAGNOSIS — Z792 Long term (current) use of antibiotics: Secondary | ICD-10-CM | POA: Diagnosis not present

## 2022-12-02 DIAGNOSIS — Z79899 Other long term (current) drug therapy: Secondary | ICD-10-CM | POA: Diagnosis not present

## 2022-12-02 DIAGNOSIS — M19012 Primary osteoarthritis, left shoulder: Secondary | ICD-10-CM | POA: Diagnosis not present

## 2022-12-02 DIAGNOSIS — E559 Vitamin D deficiency, unspecified: Secondary | ICD-10-CM | POA: Diagnosis not present

## 2022-12-02 DIAGNOSIS — M19041 Primary osteoarthritis, right hand: Secondary | ICD-10-CM | POA: Diagnosis not present

## 2022-12-02 DIAGNOSIS — F32A Depression, unspecified: Secondary | ICD-10-CM | POA: Diagnosis not present

## 2022-12-02 DIAGNOSIS — S2231XD Fracture of one rib, right side, subsequent encounter for fracture with routine healing: Secondary | ICD-10-CM | POA: Diagnosis not present

## 2022-12-02 DIAGNOSIS — I25110 Atherosclerotic heart disease of native coronary artery with unstable angina pectoris: Secondary | ICD-10-CM | POA: Diagnosis not present

## 2022-12-02 DIAGNOSIS — I6523 Occlusion and stenosis of bilateral carotid arteries: Secondary | ICD-10-CM | POA: Diagnosis not present

## 2022-12-02 DIAGNOSIS — I10 Essential (primary) hypertension: Secondary | ICD-10-CM | POA: Diagnosis not present

## 2022-12-02 DIAGNOSIS — Z7982 Long term (current) use of aspirin: Secondary | ICD-10-CM | POA: Diagnosis not present

## 2022-12-02 DIAGNOSIS — M170 Bilateral primary osteoarthritis of knee: Secondary | ICD-10-CM | POA: Diagnosis not present

## 2022-12-06 ENCOUNTER — Other Ambulatory Visit: Payer: Self-pay | Admitting: Family

## 2022-12-06 DIAGNOSIS — F411 Generalized anxiety disorder: Secondary | ICD-10-CM

## 2022-12-06 DIAGNOSIS — F331 Major depressive disorder, recurrent, moderate: Secondary | ICD-10-CM

## 2022-12-07 DIAGNOSIS — I959 Hypotension, unspecified: Secondary | ICD-10-CM | POA: Diagnosis not present

## 2022-12-07 DIAGNOSIS — Z7951 Long term (current) use of inhaled steroids: Secondary | ICD-10-CM | POA: Diagnosis not present

## 2022-12-07 DIAGNOSIS — I25110 Atherosclerotic heart disease of native coronary artery with unstable angina pectoris: Secondary | ICD-10-CM | POA: Diagnosis not present

## 2022-12-07 DIAGNOSIS — M19041 Primary osteoarthritis, right hand: Secondary | ICD-10-CM | POA: Diagnosis not present

## 2022-12-07 DIAGNOSIS — Z792 Long term (current) use of antibiotics: Secondary | ICD-10-CM | POA: Diagnosis not present

## 2022-12-07 DIAGNOSIS — F32A Depression, unspecified: Secondary | ICD-10-CM | POA: Diagnosis not present

## 2022-12-07 DIAGNOSIS — M170 Bilateral primary osteoarthritis of knee: Secondary | ICD-10-CM | POA: Diagnosis not present

## 2022-12-07 DIAGNOSIS — M19011 Primary osteoarthritis, right shoulder: Secondary | ICD-10-CM | POA: Diagnosis not present

## 2022-12-07 DIAGNOSIS — Z79899 Other long term (current) drug therapy: Secondary | ICD-10-CM | POA: Diagnosis not present

## 2022-12-07 DIAGNOSIS — I10 Essential (primary) hypertension: Secondary | ICD-10-CM | POA: Diagnosis not present

## 2022-12-07 DIAGNOSIS — S2231XD Fracture of one rib, right side, subsequent encounter for fracture with routine healing: Secondary | ICD-10-CM | POA: Diagnosis not present

## 2022-12-07 DIAGNOSIS — E538 Deficiency of other specified B group vitamins: Secondary | ICD-10-CM | POA: Diagnosis not present

## 2022-12-07 DIAGNOSIS — J4540 Moderate persistent asthma, uncomplicated: Secondary | ICD-10-CM | POA: Diagnosis not present

## 2022-12-07 DIAGNOSIS — M19012 Primary osteoarthritis, left shoulder: Secondary | ICD-10-CM | POA: Diagnosis not present

## 2022-12-07 DIAGNOSIS — Z7982 Long term (current) use of aspirin: Secondary | ICD-10-CM | POA: Diagnosis not present

## 2022-12-07 DIAGNOSIS — E559 Vitamin D deficiency, unspecified: Secondary | ICD-10-CM | POA: Diagnosis not present

## 2022-12-07 DIAGNOSIS — M19042 Primary osteoarthritis, left hand: Secondary | ICD-10-CM | POA: Diagnosis not present

## 2022-12-07 DIAGNOSIS — E785 Hyperlipidemia, unspecified: Secondary | ICD-10-CM | POA: Diagnosis not present

## 2022-12-07 DIAGNOSIS — Z9181 History of falling: Secondary | ICD-10-CM | POA: Diagnosis not present

## 2022-12-07 DIAGNOSIS — I6523 Occlusion and stenosis of bilateral carotid arteries: Secondary | ICD-10-CM | POA: Diagnosis not present

## 2022-12-08 ENCOUNTER — Ambulatory Visit (INDEPENDENT_AMBULATORY_CARE_PROVIDER_SITE_OTHER): Payer: Medicare Other

## 2022-12-08 DIAGNOSIS — M19041 Primary osteoarthritis, right hand: Secondary | ICD-10-CM

## 2022-12-08 DIAGNOSIS — E559 Vitamin D deficiency, unspecified: Secondary | ICD-10-CM

## 2022-12-08 DIAGNOSIS — M19011 Primary osteoarthritis, right shoulder: Secondary | ICD-10-CM | POA: Diagnosis not present

## 2022-12-08 DIAGNOSIS — M19042 Primary osteoarthritis, left hand: Secondary | ICD-10-CM

## 2022-12-08 DIAGNOSIS — I25110 Atherosclerotic heart disease of native coronary artery with unstable angina pectoris: Secondary | ICD-10-CM | POA: Diagnosis not present

## 2022-12-08 DIAGNOSIS — S2231XD Fracture of one rib, right side, subsequent encounter for fracture with routine healing: Secondary | ICD-10-CM | POA: Diagnosis not present

## 2022-12-08 DIAGNOSIS — F411 Generalized anxiety disorder: Secondary | ICD-10-CM

## 2022-12-08 DIAGNOSIS — I959 Hypotension, unspecified: Secondary | ICD-10-CM

## 2022-12-08 DIAGNOSIS — I6523 Occlusion and stenosis of bilateral carotid arteries: Secondary | ICD-10-CM | POA: Diagnosis not present

## 2022-12-08 DIAGNOSIS — M170 Bilateral primary osteoarthritis of knee: Secondary | ICD-10-CM | POA: Diagnosis not present

## 2022-12-08 DIAGNOSIS — E538 Deficiency of other specified B group vitamins: Secondary | ICD-10-CM

## 2022-12-08 DIAGNOSIS — I10 Essential (primary) hypertension: Secondary | ICD-10-CM | POA: Diagnosis not present

## 2022-12-08 DIAGNOSIS — J4540 Moderate persistent asthma, uncomplicated: Secondary | ICD-10-CM | POA: Diagnosis not present

## 2022-12-08 DIAGNOSIS — M19012 Primary osteoarthritis, left shoulder: Secondary | ICD-10-CM | POA: Diagnosis not present

## 2022-12-08 DIAGNOSIS — F32A Depression, unspecified: Secondary | ICD-10-CM

## 2022-12-09 ENCOUNTER — Telehealth: Payer: Self-pay | Admitting: Family

## 2022-12-09 ENCOUNTER — Other Ambulatory Visit: Payer: Self-pay | Admitting: Family

## 2022-12-09 DIAGNOSIS — I6523 Occlusion and stenosis of bilateral carotid arteries: Secondary | ICD-10-CM | POA: Diagnosis not present

## 2022-12-09 DIAGNOSIS — M19011 Primary osteoarthritis, right shoulder: Secondary | ICD-10-CM | POA: Diagnosis not present

## 2022-12-09 DIAGNOSIS — I10 Essential (primary) hypertension: Secondary | ICD-10-CM

## 2022-12-09 DIAGNOSIS — E785 Hyperlipidemia, unspecified: Secondary | ICD-10-CM | POA: Diagnosis not present

## 2022-12-09 DIAGNOSIS — E538 Deficiency of other specified B group vitamins: Secondary | ICD-10-CM | POA: Diagnosis not present

## 2022-12-09 DIAGNOSIS — Z9181 History of falling: Secondary | ICD-10-CM | POA: Diagnosis not present

## 2022-12-09 DIAGNOSIS — Z792 Long term (current) use of antibiotics: Secondary | ICD-10-CM | POA: Diagnosis not present

## 2022-12-09 DIAGNOSIS — I25110 Atherosclerotic heart disease of native coronary artery with unstable angina pectoris: Secondary | ICD-10-CM | POA: Diagnosis not present

## 2022-12-09 DIAGNOSIS — S2231XD Fracture of one rib, right side, subsequent encounter for fracture with routine healing: Secondary | ICD-10-CM | POA: Diagnosis not present

## 2022-12-09 DIAGNOSIS — M19041 Primary osteoarthritis, right hand: Secondary | ICD-10-CM | POA: Diagnosis not present

## 2022-12-09 DIAGNOSIS — F32A Depression, unspecified: Secondary | ICD-10-CM | POA: Diagnosis not present

## 2022-12-09 DIAGNOSIS — Z7982 Long term (current) use of aspirin: Secondary | ICD-10-CM | POA: Diagnosis not present

## 2022-12-09 DIAGNOSIS — Z7951 Long term (current) use of inhaled steroids: Secondary | ICD-10-CM | POA: Diagnosis not present

## 2022-12-09 DIAGNOSIS — E559 Vitamin D deficiency, unspecified: Secondary | ICD-10-CM | POA: Diagnosis not present

## 2022-12-09 DIAGNOSIS — M19012 Primary osteoarthritis, left shoulder: Secondary | ICD-10-CM | POA: Diagnosis not present

## 2022-12-09 DIAGNOSIS — Z79899 Other long term (current) drug therapy: Secondary | ICD-10-CM | POA: Diagnosis not present

## 2022-12-09 DIAGNOSIS — M170 Bilateral primary osteoarthritis of knee: Secondary | ICD-10-CM | POA: Diagnosis not present

## 2022-12-09 DIAGNOSIS — I959 Hypotension, unspecified: Secondary | ICD-10-CM | POA: Diagnosis not present

## 2022-12-09 DIAGNOSIS — J4540 Moderate persistent asthma, uncomplicated: Secondary | ICD-10-CM | POA: Diagnosis not present

## 2022-12-09 DIAGNOSIS — M19042 Primary osteoarthritis, left hand: Secondary | ICD-10-CM | POA: Diagnosis not present

## 2022-12-09 NOTE — Telephone Encounter (Signed)
Lmtcb.

## 2022-12-09 NOTE — Telephone Encounter (Signed)
Patient return call. ?

## 2022-12-09 NOTE — Telephone Encounter (Signed)
Daughter calling in to check on referral status for GI and home health services. Please call back with up date.

## 2022-12-09 NOTE — Telephone Encounter (Signed)
Hgb is stable, I would hold off on GI appointment at this time. I thought home health was already seeing patient?

## 2022-12-11 DIAGNOSIS — Z743 Need for continuous supervision: Secondary | ICD-10-CM | POA: Diagnosis not present

## 2022-12-11 DIAGNOSIS — I469 Cardiac arrest, cause unspecified: Secondary | ICD-10-CM | POA: Diagnosis not present

## 2022-12-12 ENCOUNTER — Ambulatory Visit: Payer: Medicare Other | Admitting: Family

## 2022-12-13 ENCOUNTER — Other Ambulatory Visit: Payer: Self-pay | Admitting: Family

## 2023-01-01 ENCOUNTER — Other Ambulatory Visit: Payer: Self-pay | Admitting: Family

## 2023-01-01 DIAGNOSIS — J4540 Moderate persistent asthma, uncomplicated: Secondary | ICD-10-CM

## 2023-01-05 DEATH — deceased

## 2023-01-30 ENCOUNTER — Telehealth: Payer: Self-pay | Admitting: Diagnostic Neuroimaging

## 2023-01-30 NOTE — Telephone Encounter (Signed)
Pt passed away 12/23/2022

## 2023-01-30 NOTE — Telephone Encounter (Signed)
Sympathy card placed in MD office to sign and sent to family

## 2023-02-06 ENCOUNTER — Ambulatory Visit: Payer: Medicare Other | Admitting: Diagnostic Neuroimaging
